# Patient Record
Sex: Female | Born: 1940
Health system: Southern US, Community
[De-identification: ages and names within clinical notes are randomized; demographics above are authoritative.]

## PROBLEM LIST (undated history)

## (undated) DIAGNOSIS — I6529 Occlusion and stenosis of unspecified carotid artery: Secondary | ICD-10-CM

## (undated) DIAGNOSIS — K649 Unspecified hemorrhoids: Secondary | ICD-10-CM

## (undated) DIAGNOSIS — E039 Hypothyroidism, unspecified: Secondary | ICD-10-CM

## (undated) DIAGNOSIS — I10 Essential (primary) hypertension: Secondary | ICD-10-CM

## (undated) DIAGNOSIS — K602 Anal fissure, unspecified: Secondary | ICD-10-CM

## (undated) DIAGNOSIS — K635 Polyp of colon: Secondary | ICD-10-CM

## (undated) DIAGNOSIS — E785 Hyperlipidemia, unspecified: Secondary | ICD-10-CM

## (undated) DIAGNOSIS — G709 Myoneural disorder, unspecified: Secondary | ICD-10-CM

## (undated) DIAGNOSIS — K5790 Diverticulosis of intestine, part unspecified, without perforation or abscess without bleeding: Secondary | ICD-10-CM

## (undated) DIAGNOSIS — E119 Type 2 diabetes mellitus without complications: Secondary | ICD-10-CM

## (undated) DIAGNOSIS — R011 Cardiac murmur, unspecified: Secondary | ICD-10-CM

## (undated) DIAGNOSIS — M1990 Unspecified osteoarthritis, unspecified site: Secondary | ICD-10-CM

## (undated) DIAGNOSIS — K219 Gastro-esophageal reflux disease without esophagitis: Secondary | ICD-10-CM

## (undated) DIAGNOSIS — R7303 Prediabetes: Secondary | ICD-10-CM

## (undated) HISTORY — DX: Unspecified hemorrhoids: K64.9

## (undated) HISTORY — PX: APPENDECTOMY: SHX54

## (undated) HISTORY — DX: Prediabetes: R73.03

## (undated) HISTORY — PX: COLONOSCOPY: SHX174

## (undated) HISTORY — DX: Anal fissure, unspecified: K60.2

## (undated) HISTORY — DX: Gastro-esophageal reflux disease without esophagitis: K21.9

## (undated) HISTORY — DX: Diverticulosis of intestine, part unspecified, without perforation or abscess without bleeding: K57.90

## (undated) HISTORY — PX: OTHER SURGICAL HISTORY: SHX169

## (undated) HISTORY — PX: ABDOMINAL HYSTERECTOMY: SHX81

## (undated) HISTORY — DX: Hyperlipidemia, unspecified: E78.5

## (undated) HISTORY — PX: BLADDER SUSPENSION: SHX72

## (undated) HISTORY — DX: Polyp of colon: K63.5

## (undated) SURGERY — Surgical Case
Anesthesia: *Unknown

---
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
## 1986-02-28 HISTORY — PX: ABDOMINAL HYSTERECTOMY: SHX81

## 1997-07-31 ENCOUNTER — Ambulatory Visit (HOSPITAL_COMMUNITY): Admission: RE | Admit: 1997-07-31 | Discharge: 1997-07-31 | Payer: Self-pay | Admitting: Internal Medicine

## 1997-11-17 ENCOUNTER — Encounter: Admission: RE | Admit: 1997-11-17 | Discharge: 1997-11-17 | Payer: Self-pay | Admitting: *Deleted

## 1999-02-15 ENCOUNTER — Ambulatory Visit (HOSPITAL_COMMUNITY): Admission: RE | Admit: 1999-02-15 | Discharge: 1999-02-15 | Payer: Self-pay | Admitting: Internal Medicine

## 1999-02-15 ENCOUNTER — Encounter: Payer: Self-pay | Admitting: Internal Medicine

## 2000-02-28 ENCOUNTER — Ambulatory Visit (HOSPITAL_COMMUNITY): Admission: RE | Admit: 2000-02-28 | Discharge: 2000-02-28 | Payer: Self-pay | Admitting: Internal Medicine

## 2000-02-28 ENCOUNTER — Encounter: Payer: Self-pay | Admitting: Internal Medicine

## 2001-03-06 ENCOUNTER — Encounter: Payer: Self-pay | Admitting: Internal Medicine

## 2001-03-06 ENCOUNTER — Ambulatory Visit (HOSPITAL_COMMUNITY): Admission: RE | Admit: 2001-03-06 | Discharge: 2001-03-06 | Payer: Self-pay | Admitting: Internal Medicine

## 2001-08-14 ENCOUNTER — Encounter: Payer: Self-pay | Admitting: Orthopedic Surgery

## 2001-08-14 ENCOUNTER — Ambulatory Visit (HOSPITAL_COMMUNITY): Admission: RE | Admit: 2001-08-14 | Discharge: 2001-08-14 | Payer: Self-pay | Admitting: Orthopedic Surgery

## 2002-03-18 ENCOUNTER — Encounter: Payer: Self-pay | Admitting: Internal Medicine

## 2002-03-18 ENCOUNTER — Ambulatory Visit (HOSPITAL_COMMUNITY): Admission: RE | Admit: 2002-03-18 | Discharge: 2002-03-18 | Payer: Self-pay | Admitting: Internal Medicine

## 2002-06-10 ENCOUNTER — Encounter: Admission: RE | Admit: 2002-06-10 | Discharge: 2002-06-10 | Payer: Self-pay | Admitting: Urology

## 2002-06-10 ENCOUNTER — Encounter: Payer: Self-pay | Admitting: Urology

## 2002-06-11 ENCOUNTER — Ambulatory Visit (HOSPITAL_BASED_OUTPATIENT_CLINIC_OR_DEPARTMENT_OTHER): Admission: RE | Admit: 2002-06-11 | Discharge: 2002-06-11 | Payer: Self-pay | Admitting: Urology

## 2003-03-20 ENCOUNTER — Ambulatory Visit (HOSPITAL_COMMUNITY): Admission: RE | Admit: 2003-03-20 | Discharge: 2003-03-20 | Payer: Self-pay | Admitting: Internal Medicine

## 2004-03-30 ENCOUNTER — Ambulatory Visit (HOSPITAL_COMMUNITY): Admission: RE | Admit: 2004-03-30 | Discharge: 2004-03-30 | Payer: Self-pay | Admitting: Internal Medicine

## 2004-08-26 ENCOUNTER — Encounter: Admission: RE | Admit: 2004-08-26 | Discharge: 2004-08-26 | Payer: Self-pay | Admitting: Occupational Medicine

## 2005-02-28 HISTORY — PX: JOINT REPLACEMENT: SHX530

## 2005-04-25 ENCOUNTER — Ambulatory Visit (HOSPITAL_COMMUNITY): Admission: RE | Admit: 2005-04-25 | Discharge: 2005-04-25 | Payer: Self-pay | Admitting: Internal Medicine

## 2006-04-24 ENCOUNTER — Inpatient Hospital Stay (HOSPITAL_COMMUNITY): Admission: RE | Admit: 2006-04-24 | Discharge: 2006-04-28 | Payer: Self-pay | Admitting: Orthopedic Surgery

## 2006-06-12 ENCOUNTER — Ambulatory Visit (HOSPITAL_COMMUNITY): Admission: RE | Admit: 2006-06-12 | Discharge: 2006-06-12 | Payer: Self-pay | Admitting: Internal Medicine

## 2007-05-24 ENCOUNTER — Ambulatory Visit: Payer: Self-pay | Admitting: Gastroenterology

## 2007-06-12 ENCOUNTER — Encounter: Payer: Self-pay | Admitting: Gastroenterology

## 2007-06-12 ENCOUNTER — Ambulatory Visit: Payer: Self-pay | Admitting: Gastroenterology

## 2007-06-14 ENCOUNTER — Ambulatory Visit (HOSPITAL_COMMUNITY): Admission: RE | Admit: 2007-06-14 | Discharge: 2007-06-14 | Payer: Self-pay | Admitting: Internal Medicine

## 2007-06-25 ENCOUNTER — Encounter: Payer: Self-pay | Admitting: Gastroenterology

## 2008-06-24 ENCOUNTER — Ambulatory Visit (HOSPITAL_COMMUNITY): Admission: RE | Admit: 2008-06-24 | Discharge: 2008-06-24 | Payer: Self-pay | Admitting: Internal Medicine

## 2008-12-01 ENCOUNTER — Encounter (HOSPITAL_COMMUNITY): Admission: RE | Admit: 2008-12-01 | Discharge: 2008-12-01 | Payer: Self-pay | Admitting: Orthopedic Surgery

## 2009-06-09 ENCOUNTER — Encounter (INDEPENDENT_AMBULATORY_CARE_PROVIDER_SITE_OTHER): Payer: Self-pay | Admitting: *Deleted

## 2009-06-15 ENCOUNTER — Encounter (INDEPENDENT_AMBULATORY_CARE_PROVIDER_SITE_OTHER): Payer: Self-pay | Admitting: *Deleted

## 2009-06-15 ENCOUNTER — Telehealth: Payer: Self-pay | Admitting: Gastroenterology

## 2009-06-25 ENCOUNTER — Ambulatory Visit (HOSPITAL_COMMUNITY): Admission: RE | Admit: 2009-06-25 | Discharge: 2009-06-25 | Payer: Self-pay | Admitting: Internal Medicine

## 2009-07-08 ENCOUNTER — Encounter (INDEPENDENT_AMBULATORY_CARE_PROVIDER_SITE_OTHER): Payer: Self-pay | Admitting: *Deleted

## 2009-07-13 ENCOUNTER — Ambulatory Visit: Payer: Self-pay | Admitting: Gastroenterology

## 2009-07-21 ENCOUNTER — Ambulatory Visit: Payer: Self-pay | Admitting: Gastroenterology

## 2009-07-27 ENCOUNTER — Encounter: Payer: Self-pay | Admitting: Gastroenterology

## 2009-09-07 ENCOUNTER — Ambulatory Visit (HOSPITAL_COMMUNITY): Admission: RE | Admit: 2009-09-07 | Discharge: 2009-09-07 | Payer: Self-pay | Admitting: Orthopedic Surgery

## 2010-03-21 ENCOUNTER — Encounter: Payer: Self-pay | Admitting: Internal Medicine

## 2010-04-01 NOTE — Progress Notes (Signed)
Summary: Triage   Phone Note Call from Patient Call back at Home Phone [phone]   Caller: Patient Call For: Dr. Russella Dar Reason for Call: Talk to Nurse Summary of Call: pt is confused b/c she was thinking her colonoscopy was due 05/2010. Our records show 05/2009. Would like to discuss Initial call taken by: Karna Christmas,  June 15, 2009 2:39 PM  Follow-up for Phone Call        Colon recall is 4/11 so pt. scheduled for procedure and pre-visit. Follow-up by: Teryl Lucy RN,  June 15, 2009 3:17 PM

## 2010-04-01 NOTE — Letter (Signed)
Summary: [facility] Instructions  [facility]  [address] [place], [place] [address]   Phone: [phone]  Fax: [phone]       ARELLA BLINDER    [birth year]    MRN: [id number]        Procedure Day Dorna Bloom:  Jake Shark  07/21/09     Arrival Time:  1:00PM     Procedure Time:  2:00PM     Location of Procedure:                    _X _  [facility] (4th Floor)                      PREPARATION FOR COLONOSCOPY WITH MOVIPREP   Starting 5 days prior to your procedure 07/16/09 do not eat nuts, seeds, popcorn, corn, beans, peas,  salads, or any raw vegetables.  Do not take any fiber supplements (e.g. Metamucil, Citrucel, and Benefiber).  THE DAY BEFORE YOUR PROCEDURE         DATE: 07/20/09  DAY: MONDAY  1.  Drink clear liquids the entire day-NO SOLID FOOD  2.  Do not drink anything colored red or purple.  Avoid juices with pulp.  No orange juice.  3.  Drink at least 64 oz. (8 glasses) of fluid/clear liquids during the day to prevent dehydration and help the prep work efficiently.  CLEAR LIQUIDS INCLUDE: Water Jello Ice Popsicles Tea (sugar ok, no milk/cream) Powdered fruit flavored drinks Coffee (sugar ok, no milk/cream) Gatorade Juice: apple, white grape, white cranberry  Lemonade Clear bullion, consomm, broth Carbonated beverages (any kind) Strained chicken noodle soup Hard Candy                             4.  In the morning, mix first dose of MoviPrep solution:    Empty 1 Pouch A and 1 Pouch B into the disposable container    Add lukewarm drinking water to the top line of the container. Mix to dissolve    Refrigerate (mixed solution should be used within 24 hrs)  5.  Begin drinking the prep at 5:00 p.m. The MoviPrep container is divided by 4 marks.   Every 15 minutes drink the solution down to the next mark (approximately 8 oz) until the full liter is complete.   6.  Follow completed prep with 16 oz of clear liquid of your choice (Nothing red  or purple).  Continue to drink clear liquids until bedtime.  7.  Before going to bed, mix second dose of MoviPrep solution:    Empty 1 Pouch A and 1 Pouch B into the disposable container    Add lukewarm drinking water to the top line of the container. Mix to dissolve    Refrigerate  THE DAY OF YOUR PROCEDURE      DATE: 07/21/09  DAY: TUESDAY  Beginning at 9:00AM (5 hours before procedure):         1. Every 15 minutes, drink the solution down to the next mark (approx 8 oz) until the full liter is complete.  2. Follow completed prep with 16 oz. of clear liquid of your choice.    3. You may drink clear liquids until 12:00PM (2 HOURS BEFORE PROCEDURE).   MEDICATION INSTRUCTIONS  Unless otherwise instructed, you should take regular prescription medications with a small sip of water   as early as possible the morning of your  procedure.   Additional medication instructions: Hold Lasix and Bisofum/Hct the morning of procedure.         OTHER INSTRUCTIONS  You will need a responsible adult at least 70 years of age to accompany you and drive you home.   This person must remain in the waiting room during your procedure.  Wear loose fitting clothing that is easily removed.  Leave jewelry and other valuables at home.  However, you may wish to bring a book to read or  an iPod/MP3 player to listen to music as you wait for your procedure to start.  Remove all body piercing jewelry and leave at home.  Total time from sign-in until discharge is approximately 2-3 hours.  You should go home directly after your procedure and rest.  You can resume normal activities the  day after your procedure.  The day of your procedure you should not:   Drive   Make legal decisions   Operate machinery   Drink alcohol   Return to work  You will receive specific instructions about eating, activities and medications before you leave.    The above instructions have been reviewed and explained  to me by   _______________________    I fully understand and can verbalize these instructions _____________________________ Date _________

## 2010-04-01 NOTE — Letter (Signed)
Summary: Patient Notice- Polyp Results  [facility]  [address] [place], [place] [address]   Phone: [phone]  Fax: [phone]        Jul 27, 2009 MRN: [id number]    [facility] [address] [place], [place]  [address]    Dear Ms. Lance,  I am pleased to inform you that the colon polyp(s) removed during your recent colonoscopy was (were) found to be benign (no cancer detected) upon pathologic examination.  I recommend you have a repeat colonoscopy examination in 3 years to look for recurrent polyps, as having colon polyps increases your risk for having recurrent polyps or even colon cancer in the future.  Should you develop new or worsening symptoms of abdominal pain, bowel habit changes or bleeding from the rectum or bowels, please schedule an evaluation with either your primary care physician or with me.  Continue treatment plan as outlined the day of your exam.  Please call us if you are having persistent problems or have questions about your condition that have not been fully answered at this time.  Sincerely,  Meryl Dare MD [facility]  This letter has been electronically signed by your physician.  Appended Document: Patient Notice- Polyp Results letter mailed.

## 2010-04-01 NOTE — Miscellaneous (Signed)
Summary: [facility] Previsit/prep  Clinical Lists Changes  Medications: Added new medication of MOVIPREP 100 GM  SOLR (PEG-KCL-NACL-NASULF-NA ASC-C) As per prep instructions. - Signed Rx of MOVIPREP 100 GM  SOLR (PEG-KCL-NACL-NASULF-NA ASC-C) As per prep instructions.;  #1 x 0;  Signed;  Entered by: Wyona Almas RN;  Authorized by: Meryl Dare MD [facility];  Method used: Electronically to [facility] [address], [place], [place], [place]  [address], Ph: [phone], Fax: [phone] Observations: Added new observation of NKA: T (07/13/2009 15:31)    Prescriptions: MOVIPREP 100 GM  SOLR (PEG-KCL-NACL-NASULF-NA ASC-C) As per prep instructions.  #1 x 0   Entered by:   Wyona Almas RN   Authorized by:   Meryl Dare MD [facility]   Signed by:   Wyona Almas RN on 07/13/2009   Method used:   Electronically to        [facility] (retail)       [address]       [place], [place]  [address]       Ph: [phone]       Fax: [phone]   RxID:   [id number]

## 2010-04-01 NOTE — Letter (Signed)
Summary: Colonoscopy Letter  [facility]  [address] [place], [place] [address]   Phone: [phone]  Fax: [phone]      June 09, 2009 MRN: [id number]   [facility] [address] [place], [place]  [address]   Dear Ms. Deller,   According to your medical record, it is time for you to schedule a Colonoscopy. The American Cancer Society recommends this procedure as a method to detect early colon cancer. Patients with a family history of colon cancer, or a personal history of colon polyps or inflammatory bowel disease are at increased risk.  This letter has beeen generated based on the recommendations made at the time of your procedure. If you feel that in your particular situation this may no longer apply, please contact our office.  Please call our office at [phone] to schedule this appointment or to update your records at your earliest convenience.  Thank you for cooperating with [place] to provide you with the very best care possible.   Sincerely,  Judie Petit T. Russella Dar, M.D.  [facility] Gastroenterology Division [phone]

## 2010-04-01 NOTE — Procedures (Signed)
Summary: Colonoscopy  Patient: Rebecca Graves Note: All result statuses are Final unless otherwise noted.  Tests: (1) Colonoscopy (COL)   COL Colonoscopy           DONE     [facility]     [address]     [place], [place]  [address]           COLONOSCOPY PROCEDURE REPORT           PATIENT:  Rebecca Graves, Rebecca Graves  MR#:  [id number]     BIRTHDATE:  [birth year], 68 yrs. old  GENDER:  female     ENDOSCOPIST:  Judie Petit T. Russella Dar, MD, [facility]           PROCEDURE DATE:  07/21/2009     PROCEDURE:  Colonoscopy with snare polypectomy     ASA CLASS:  Class II     INDICATIONS:  1) follow-up of polyp  2) surveillance and high-risk     screening, adenomatous polyp with HGD, 07/2007     MEDICATIONS:   Fentanyl 100 mcg IV, Versed 10 mg IV     DESCRIPTION OF PROCEDURE:   After the risks benefits and     alternatives of the procedure were thoroughly explained, informed     consent was obtained.  Digital rectal exam was performed and     revealed no abnormalities.  The LB PCF-H180AL [id number] endoscope     was introduced through the anus and advanced to the cecum, which     was identified by both the appendix and ileocecal valve, without     limitations. The quality of the prep was excellent, using     MoviPrep.  The instrument was then slowly withdrawn as the colon     was fully examined.     <<PROCEDUREIMAGES>>     FINDINGS:  A sessile polyp was found in the cecum. It was 7 mm in     size. Polyp was snared without cautery. Retrieval was successful.     Two polyps were found in the mid transverse colon. They were 4 - 6     mm in size. Polyps were snared without cautery. Retrieval was     successful. A sessile polyp was found in the sigmoid colon. It was     5 mm in size. Polyp was snared without cautery. Retrieval was     successful. Mild diverticulosis was found in the sigmoid colon.     This was otherwise a normal examination of the colon. Retroflexed     views in the rectum revealed no  abnormalities. The time to cecum =     3.75  minutes. The scope was then withdrawn (time =  11  min) from     the patient and the procedure completed.     COMPLICATIONS:  None           ENDOSCOPIC IMPRESSION:     1) 7 mm sessile polyp in the cecum     2) 4 - 6 mm Two polyps in the mid transverse colon     3) 5 mm sessile polyp in the sigmoid colon     4) Mild diverticulosis in the sigmoid colon     RECOMMENDATIONS:     1) Await pathology results     2) High fiber diet with liberal fluid intake.     3) Repeat Colonoscopy in 2-3 years pending pathology review.           Venita Lick. Russella Dar, MD,  FACG           CC: [name], MD           n.     Rosalie DoctorVenita Lick. Keyara Ent at 07/21/2009 02:41 PM           Georgeanna Harrison, [id number]  Note: An exclamation mark (!) indicates a result that was not dispersed into the flowsheet. Document Creation Date: 07/21/2009 2:42 PM _______________________________________________________________________  (1) Order result status: Final Collection or observation date-time: 07/21/2009 14:34 Requested date-time:  Receipt date-time:  Reported date-time:  Referring Physician:   Ordering Physician: Claudette Head [phone]) Specimen Source:  Source: Launa Grill Order Number: [id number] Lab site:   Appended Document: Colonoscopy     Procedures Next Due Date:    Colonoscopy: 06/2012

## 2010-04-01 NOTE — Letter (Signed)
Summary: Previsit letter  [facility]  [address] [place], [place] [address]   Phone: [phone]  Fax: [phone]       06/15/2009 MRN: [id number]  [facility] [address] [place], [place]  [address]  Dear Ms. Knaak,  Welcome to the Gastroenterology Division at [facility].    You are scheduled to see a nurse for your pre-procedure visit on Monday-May 16,2011 at 3:30 p.m. on the 3rd floor at [facility], [address]  We ask that you try to arrive at our office 15 minutes prior to your appointment time to allow for check-in.  Your nurse visit will consist of discussing your medical and surgical history, your immediate family medical history, and your medications.    Please bring a complete list of all your medications or, if you prefer, bring the medication bottles and we will list them.  We will need to be aware of both prescribed and over the counter drugs.  We will need to know exact dosage information as well.  If you are on blood thinners (Coumadin, Plavix, Aggrenox, Ticlid, etc.) please call our office today/prior to your appointment, as we need to consult with your physician about holding your medication.   Please be prepared to read and sign documents such as consent forms, a financial agreement, and acknowledgement forms.  If necessary, and with your consent, a friend or relative is welcome to sit-in on the nurse visit with you.  Please bring your insurance card so that we may make a copy of it.  If your insurance requires a referral to see a specialist, please bring your referral form from your primary care physician.  No co-pay is required for this nurse visit.     If you cannot keep your appointment, please call [phone] to cancel or reschedule prior to your appointment date.  This allows [place] the opportunity to schedule an appointment for another patient in need of care.    Thank you for choosing [facility] for your  medical needs.  We appreciate the opportunity to care for you.  Please visit [place] at our website  to learn more about our practice. Your colonoscopy is scheduled for 07/21/09 at 2p.m. at [facility]. floor.                     Sincerely.                                                                                                                   The Gastroenterology Division

## 2010-05-18 ENCOUNTER — Other Ambulatory Visit (HOSPITAL_COMMUNITY): Payer: Self-pay | Admitting: Internal Medicine

## 2010-05-18 DIAGNOSIS — Z1231 Encounter for screening mammogram for malignant neoplasm of breast: Secondary | ICD-10-CM

## 2010-06-28 ENCOUNTER — Ambulatory Visit (HOSPITAL_COMMUNITY)
Admission: RE | Admit: 2010-06-28 | Discharge: 2010-06-28 | Disposition: A | Discharge status: Home / Self Care | Payer: MEDICARE | Source: Ambulatory Visit | Attending: Internal Medicine | Admitting: Internal Medicine

## 2010-06-28 DIAGNOSIS — Z1231 Encounter for screening mammogram for malignant neoplasm of breast: Secondary | ICD-10-CM | POA: Insufficient documentation

## 2010-07-13 NOTE — Assessment & Plan Note (Signed)
[facility]                         GASTROENTEROLOGY OFFICE NOTE   TANGY, DROZDOWSKI                       MRN:          [id number]  DATE:05/24/2007                            DOB:          07-31-1940    REFERRING PHYSICIAN:  [name], M.D.   REASON FOR CONSULTATION:  Family history of colon polyps and GERD.   HISTORY OF PRESENT ILLNESS:  Mrs. Holtmeyer is a 70 year old African-  American female with a sister who developed colon polyps at age 25.  No  other family members with colon cancer, colon polyps or inflammatory  bowel disease.  She relates a history of internal and external  hemorrhoids with occasional swelling, but no bleeding, pain or itching.  She has occasional gas and bloating that she relates to B complex  vitamins.  She has no abdominal pain, rectal pain, change in bowel  habits, change in stool caliber, melena, hematochezia or weight-loss.  She has had problems with reflux for the past three to five years and  her symptoms are under excellent control on omeprazole and dietary  measures.  She states she recently had stool Hemoccults that were  negative.   PAST MEDICAL HISTORY:  hypertension  hypothyroidism  osteoarthritis  glaucoma  allergic rhinitis  GERD   PAST SURGICAL HISTORY:  Status post hysterectomy, 1988.  Status post total left hip replacement, 2007.   MEDICATION ALLERGIES:  None known.   CURRENT MEDICATIONS:  Listed on the chart, updated and reviewed.   SOCIAL HISTORY:  Per the handwritten form.   REVIEW OF SYSTEMS:  Per the handwritten form.   PHYSICAL EXAM:  Well-developed, overweight, African-American female, in  no acute distress.  Height 5 feet 8 inches, weight 212.4 pounds, blood pressure is 140/70,  pulse 70 and regular.  HEENT EXAM:  Anicteric sclerae.  Oropharynx clear.  CHEST:  Clear to auscultation bilaterally.  CARDIAC:  Regular rate and rhythm without murmurs appreciated.  ABDOMEN:  Soft,  nontender, nondistended.  Normoactive bowel sounds.  No  palpable organomegaly, masses or hernias.  RECTAL EXAMINATION:  Deferred to time of colonoscopy.  EXTREMITIES:  Without clubbing, cyanosis or edema.  NEUROLOGIC:  Alert and oriented times three.  Grossly nonfocal.   ASSESSMENT AND PLAN:  1. Sister with colon polyps at age 7.  Rule out colorectal neoplasms.      Risks, benefits, and alternatives to colonoscopy with possible      biopsy and possible polypectomy discussed with the patient and she      consents to proceed.  This will be scheduled electively.  2. GERD.  Symptoms under very good control on omeprazole 40 mg q.a.m.      She is advised to begin standard antireflux measures and written      literature was supplied to the patient.  If she has persistent      reflux symptoms that require medication for another three to five      years, would consider upper endoscopy for Barrett's screening.     Venita Lick. Russella Dar, MD, [facility]  Electronically Signed    MTS/MedQ  DD: 05/25/2007  DT: 05/25/2007  Job #: [id number]

## 2010-07-16 NOTE — Op Note (Signed)
NAME:  Rebecca Graves, Rebecca Graves                          ACCOUNT NO.:  [id number]   MEDICAL RECORD NO.:  [id number]                   PATIENT TYPE:  AMB   LOCATION:  [facility]                                 FACILITY:  [facility]   PHYSICIAN:  Ronald L. Ovidio Hanger, M.D.           DATE OF BIRTH:  1940/11/10   DATE OF PROCEDURE:  06/11/2002  DATE OF DISCHARGE:                                 OPERATIVE REPORT   DIAGNOSIS:  Type 3 stress urinary incontinence.   PROCEDURES:  1. Tension-free vaginal tape.  2. Cystourethroscopy.   SURGEON:  Lucrezia Starch. Earlene Plater, M.D.   ANESTHESIA:  General endotracheal.   ESTIMATED BLOOD LOSS:  150 mL.   TUBES:  None.   COMPLICATIONS:  None.   INDICATION FOR PROCEDURE:  The patient is a lovely 70 year old black female  who basically presented with significant stress urinary incontinence, which  has been problematic for a number of years.  She underwent a straining  cystogram, which revealed very little descensus, but some beaking was noted  of the bladder neck area.  On urodynamics she was found to have a leak point  pressure of 70 cmH2O, but there was gushing leakage and we felt she did have  type 3 urinary incontinence and that a pubovaginal sling would be  appropriate.  We discussed Contigen injections and other treatments and  different types of pubovaginal slings in detail as noted in the office  record.  After understanding the risks, benefits, and alternatives, she  elected to proceed with a TVT.  She understands the risks of urinary  retention, bleeding, infection, and continued incontinence, and she knows  that within five to six days she may need to have it adjusted.   DESCRIPTION OF PROCEDURE:  The patient was placed in the supine position,  after proper general endotracheal anesthesia was placed in the dorsal  lithotomy position and prepped with Betadine in a sterile fashion.  Marks  were made at the appropriate punch sites on the abdomen, and a weighted  speculum was placed.  The submucosal area over the urethrovesical junction  was injected with approximately 10 mL of 1% Xylocaine with epinephrine.  A  16 French Foley was placed into the bladder and the bladder was drained, and  a vertical incision was made approximately a centimeter in length at the  urethrovesical junction.  Flaps were created with Strulle scissors, quite  thick, and the endopelvic fascia could be palpated bilaterally.  Utilizing  the needle insertion devices from above, punch holes were made in the skin  with a knife and each was placed and exited through the endopelvic fascia  laterally to the urethra to reach the urethrovesical junction.  Cystourethroscopy with a 22.5 French Olympus panendoscope was utilized, and  the bladder was distended.  Both 12 and 0 degree lenses were utilized.  An  efflux of clear urine was noted from the normally-placed ureteral orifices  bilaterally.  There were no perforations noted.  The TVT tapes were then  attached with the attachment device and delivered vaginally from above, and  the TVT tape was deployed after repeat cystourethroscopy had been performed,  and again no perforations were noted.  The sling was noted to be in good  position.  There was space between it and the urethra.  It appeared to lay  open, and we were comfortable with the tension.  The mucosa was then closed  with a running 2-0 Vicryl suture.  Thorough irrigation was performed.  Good  hemostasis was noted to be present.  Cystourethroscopy was again performed,  and the bladder was filled.  There were no perforations noted and when the  scope was taken out with approximately 150 mL on the Crede maneuver, slight  urine could be expressed.  Again we felt comfortable with the tension.  The  cystoscope was removed.  The tapes were cut subcutaneously from above, and  the punch holes were sealed with Dermabond.  The vaginal pack was placed  with one-inch vaginal pack with  bacitracin ointment, and the patient was  taken to the recovery room stable.                                               Ronald L. Ovidio Hanger, M.D.    RLD/MEDQ  D:  06/11/2002  T:  06/11/2002  Job:  [id number]

## 2010-07-16 NOTE — Discharge Summary (Signed)
Rebecca Graves, Rebecca Graves                ACCOUNT NO.:  [id number]   MEDICAL RECORD NO.:  [id number]          PATIENT TYPE:  INP   LOCATION:  5016                         FACILITY:  [facility]   PHYSICIAN:  Feliberto Gottron. Turner Daniels, M.D.   DATE OF BIRTH:  Jan 05, 1941   DATE OF ADMISSION:  04/24/2006  DATE OF DISCHARGE:  04/28/2006                               DISCHARGE SUMMARY   PRIMARY DIAGNOSIS:  End-stage degenerative joint disease of the right  hip.   PROCEDURE WHILE IN HOSPITAL:  Right total knee arthroplasty.   DISCHARGE SUMMARY:  The patient is a 70 year old women with a global AVN  of both hips proven by MRI scan.  She has had severe pain on the right  and little if any pain on the left side.  Has failed conservative  treatment including antiinflammatory medications and use of cane.  Got  good temporary relief from cortisone injection done under fluoroscopy by  Dr. Modesto Charon but once again has severe disabling right hip pain.  She wishes  to proceed with right total knee arthroplasty after discussing risk and  benefits of the surgery.   ALLERGIES:  No known drug allergies.   MEDICATIONS AT TIME OF ADMISSION:  1. Include Restasis.  2. Lumigan.  3. Timolol.  4. Metamucil.  5. Prevacid.  6. Synthroid.  7. Verapamil.  8. Premarin.  9. Alprazolam.  10.Tyrosamine.  11.Percocet.  12.Magnesium.  13.Red yeast rice.  14.Centrum Silver.  15.Ranitidine.   PAST MEDICAL HISTORY:  Usual childhood disease.   ADULT HISTORY:  1. Hypertension.  2. DJD.  3. Hyperthyroidism.   SURGICAL HISTORY:  1. Hysterectomy in 1986.  2. No difficulties with GET.   SOCIAL HISTORY:  No tobacco.  Occasional ethanol.  No IV drug abuse.  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   FAMILY HISTORY:  Father died at 36 due to MI.  Mother died at 18 due to  cancer.   REVIEW OF SYSTEMS:  Positive for glasses.  She denies any shortness of  breath, chest pain or recent illness.   PHYSICAL EXAMINATION:  VITAL SIGNS:   Temperature 97.6.  Pulse 61.  Respirations 16.  Blood pressure 131/77.  Patient is a 5 foot 7 inch,  94.3 kg female.  HEAD:  Normocephalic, atraumatic.  EAR:  TMs are clear.  EYES:  Pupils are equal round and reactive to light accommodation.  NOSE AND THROAT:  Benign.  She does note to have upper and lower  dentures.  NECK:  Supple.  Full range of motion.  CHEST:  Clear to auscultation.  HEART:  Regular rate and rhythm.  ABDOMEN:  Soft, nontender.  EXTREMITIES:  Shows pain with range of motion of right hip.  Internal  and external rotation 10 degrees each.  Positive foot tap.  Positive  limp.  Decreased light touch sensation of the right foot.  SKIN:  Within normal limits.   PREOPERATIVE LABS:  Including CBC, CMET, chest x-ray, EKG, PT and PTT  are within normal limits.   HOSPITAL COURSE:  On day of admission the patient was taken to  the  operating room at [facility] where she underwent right total  knee arthroplasty using DePuy components, 54-mm ASR cup, NK +0 47-mm  Ultima ball with an 18 x 13 x 150 x 42 SROM stem and an 49F small cone.  Foley catheter was placed periopeatively.  The patient was placed on a  perioperative antibiotic.  She was placed on postoperative Coumadin and  prophylaxis with bridging Lovenox performed as protocol.  She was placed  on PCA Dilaudid for pain control.  Physical therapy was begun on the  first postoperative day.  First postoperative day, the patient was  awake, alert.  No nausea or vomiting.  Taking p.o. well.  Vital signs  were stable.  She was afebrile.  Wound had scant drainage.  CBC was  within normal limits.  X-rays showed well placed, well fixed components.  She continued with weight bearing as tolerated physical therapy.  Postoperative day 2 patient was complaining of moderate pain.  She was  afebrile.  INR 1.3.  Hemoglobin 10.3.  Dressing still had scant dried  blood.  Wound edges were benign.  She continued physical therapy.   PCA  was discontinued.  Patient was being followed for discharge assistance  through discharge planning progression nurse.  Postoperative day 3 the  patient was without complaint.  She was afebrile.  Hemoglobin 8.7.  INR  1.4.  WBC 6.3.  Dressing changes showed wound to be benign.  She is  otherwise neurovascularly intact.  Had positive bowel sounds but had not  yet had a bowel movement.  Fluids were encouraged.  __________ was  signed, as it was felt that she would benefit from a short stay in a  skilled nursing facility.  Postoperative day 4, the patient was  complaining of symptoms of yeast infection.  No complaints regarding her  right hip.  She is afebrile.  Hemoglobin 8.7.  WBC 6.3.  INR 1.5.  Dressing was dry.  Wound was soft.  She was given Diflucan orally for  yeast as she has responded well to this in the past.  After long  discussion with the patient and her family despite the fact that  physical therapy felt that she might benefit from short term nursing  facility unfortunately this was not available through her insurance and  she did feel that she could safely return home enlisting the aide of  some family and friends.   MEDICATIONS AT TIME OF DISCHARGE:  Prescriptions given for:  1. Tylox.  2. Coumadin.  3. Robaxin.  4. Resume home meds as per home med rec sheet.   DIET:  Regular.   ACTIVITY:  Weight bearing as tolerated with total hip precautions of the  right lower extremity.  Dressing changes daily with paper tape and  gauze.  Return to clinic __________ one week's time.  Sooner should she  have any difficulties with drainage, increased pain not well controlled  by pain medication or fevers over 101.  She will need home health RN,  home health PT, durable medical equipment as per [place].  At the time  of discharge, the patient was medically stable and orthopedically improved after having under gone right total hip.      Laural Benes. Jannet Mantis.      Feliberto Gottron.  Turner Daniels, M.D.  Electronically Signed    JBR/MEDQ  D:  06/12/2006  T:  06/12/2006  Job:  [id number]

## 2010-07-16 NOTE — Op Note (Signed)
NAMEMARKA, TRELOAR                ACCOUNT NO.:  [id number]   MEDICAL RECORD NO.:  [id number]          PATIENT TYPE:  INP   LOCATION:  2550                         FACILITY:  [facility]   PHYSICIAN:  Feliberto Gottron. Turner Daniels, M.D.   DATE OF BIRTH:  08-Feb-1941   DATE OF PROCEDURE:  04/24/2006  DATE OF DISCHARGE:                               OPERATIVE REPORT   PREOPERATIVE DIAGNOSIS:  Avascular necrosis of bilateral hips.   POSTOPERATIVE DIAGNOSIS:  Avascular necrosis of bilateral hips.   PROCEDURE:  Right total hip arthroplasty using DePuy 54 mm ASR cup and  NK +0 47 mm Ultamet ball and 18 x 13 x 150 x 42 SROM stem and 18 F small  cone.   SURGEON:  Feliberto Gottron. Turner Daniels, M.D.   ASSISTANT:  Skip Mayer PA-C.   ANESTHETIC:  Endotracheal.   ESTIMATED BLOOD LOSS:  300 mL.   URINE OUTPUT:  300 mL.   DRAINS PLACED:  Foley catheter.   TOURNIQUET TIME:  None.   INDICATIONS FOR PROCEDURE:  70 year old woman with a global AVN of both  hips proven by MRI scan.  Interestingly, she has severe pain on the  right and little if any pain on the left side and has failed  conservative treatment, with anti-inflammatory medicine, use of a cane,  got good temporary relief from cortisone injection done under  fluoroscopy by Dr. Modesto Charon, but once again has severe disabling right hip  pain.  She is taken for right total hip arthroplasty.  Risks and  benefits of surgery discussed and she is prepared for surgical  intervention.   DESCRIPTION OF PROCEDURE:  The patient was identified by armband, taken  to the operating room at [facility].  Appropriate anesthetic  monitors were attached and general endotracheal anesthesia induced with  the patient in supine position.  She was then rolled into the left  lateral decubitus position and fixed there with a Stulberg Mark II  pelvic clamp keeping the pelvis vertical.  The right lower extremity was  then prepped and draped in usual sterile fashion from the ankle to  the  hemi pelvis and the skin along the lateral hip and thigh infiltrated  with 20 mL of half percent Marcaine and epinephrine solution.  We then  made 20 cm incision allowing posterolateral approach to the hip joint  centered over the greater trochanter.  We cut through the skin and  subcutaneous tissue and small bleeders were identified and cauterized  with Bovie the IT band was cut in line with the skin incision, exposing  the greater trochanter, short external rotators and the gluteus muscles.  A Cobra retractor was placed between the gluteus minimus and the  superior hip joint capsule and second Cobra between the quadratus  femoris and the inferior joint capsule.  This isolated the piriformis  and short external rotators which were then cut off their insertion on  the intertrochanteric crest and tagged with a #2 Ethilon suture.  This  exposed the posterior aspect of the hip joint capsule which was then  developed into an acetabular based flap and  likewise tagged with two #2  Ethilon sutures.  The hip was then flexed, internally rotated  dislocating the arthritic femoral head which was bare bone superiorly  and evidence of delamination of the cartilage in the periphery.  A  standard neck cut was then performed one fingerbreadth above the lesser  trochanter and the proximal femur was translated anteriorly, levering  off the anterior column with a Homan retractor.  Posterior-superior and  posterior-inferior wing retractors were placed around the acetabulum  allowing resection of the labrum and finally a Cobra retractor was  placed in the cotyloid notch, enhancing our acetabular exposure.  We  then sequentially reamed up to a 53 mm basket reamer obtaining good  coverage in all quadrants.  Satisfied with the reaming, we then hammered  into place a 54-mm DePuy ASR shell obtaining good firm fit.  The hip was  then flexed and internally rotated exposing the proximal femur which was  entered  with the initiating reamer and then sequentially reamed up to a  13.5-mm axial reamer followed by conical milling up to an 18 F cone and  calcar milling up to a small calcar and 18 F small trial cone was then  placed in the proximal femur with a trial stem with a 42 neck at the  same version as the neck of the femur and an NK +0 trial 47 mm ball.  The hip was the reduced.  She did come to full extension.  She could  flex her knee to 130 degrees and could not be dislocated anteriorly.  In  flexion, you could flex to the 90, internally rotate to almost 80  degrees and once again, she was not stable.  At this point the trial  components were then removed.  The wound was irrigated out with normal  saline solution.  An 35 F small ZTT1 cone was then hammered into the  proximal femur followed by an 18 x 13 x 150 x 42 stem in the same  version as the cone followed by an NK +0 47 mm Ultamet ball which was  hammered on the stem with the module.  The hip was again reduced.  Stability was noted to be excellent. Range of motion was excellent.  The  wound once again irrigated out with normal saline solution and then the  capsular flap and short external rotators were repaired back to the  intertrochanteric crest through drill holes.  The IT band was closed  with running #1 Vicryl suture.  The subcutaneous tissue 0-0 and 2-0  undyed Vicryl suture and the skin with running interlocking 3-0 nylon  suture.  A dressing of Xeroform, and Metafil bandage was then applied.  The patient was undraped, unclamped, rolled supine, awakened and taken  to the recovery room without difficulty.      Feliberto Gottron. Turner Daniels, M.D.  Electronically Signed     FJR/MEDQ  D:  04/24/2006  T:  04/24/2006  Job:  [id number]

## 2011-03-10 ENCOUNTER — Other Ambulatory Visit: Payer: Self-pay | Admitting: Orthopedic Surgery

## 2011-03-22 NOTE — H&P (Signed)
  HISTORY OF PRESENT ILLNESS:  Ms. Rebecca Graves returns in followup, complaining of continued pain in her right knee.  She localizes her discomfort to the lateral side.  She had a cortisone injection in July 2012 that provided her with approximately two weeks of complete pain relief.  She reports that her knee and calf pain both resolved, and the cortisone shot was working well.  At this point she has minimal pain at rest but significant pain any time she is weightbearing.  Pivoting and changing directions are particularly painful.  She also complains of clicking and catching in the knee.  She is using 800 mg of ibuprofen three times a day on particularly bad days.  PAST MEDICAL HX: Hypertension, sleep apnea.  No known drug allergies  FAMILY HX: Hypertension  SOCIAL HX: Denies tobacco, Occasional alcohol   Review of systems: Patient denies dizziness, nausea, fever, chills, vomiting, shortness of breath, chest pain, loss of appetite, or rash.  All other systems are negative as related to the chief Complaint.  PHYSICAL EXAM: Well-developed, well-nourished.  Awake, alert, and oriented x3.  Extraocular motion is intact.  No use of accessory respiratory muscles for breathing.   Cardiovascular exam reveals a regular rhythm.  Skin is intact without cuts, scrapes, or abrasions  Right Knee:  Demonstrates exquisite tenderness to palpation along the lateral joint line. McMurray test is positive.  The ligaments are globally stable.  She has a trace effusion.  Range of motion is full.    IMPRESSION:  Right knee lateral meniscal tear with positive response to cortisone.  PLAN:   We will also go ahead and get her set up for a right knee arthroscopy.  We look forward to seeing her at the time of surgical intervention.

## 2011-03-23 ENCOUNTER — Encounter (HOSPITAL_BASED_OUTPATIENT_CLINIC_OR_DEPARTMENT_OTHER)
Admission: RE | Admit: 2011-03-23 | Discharge: 2011-03-23 | Disposition: A | Discharge status: Home / Self Care | Payer: Medicare Other | Source: Ambulatory Visit | Attending: Orthopedic Surgery | Admitting: Orthopedic Surgery

## 2011-03-23 ENCOUNTER — Encounter (HOSPITAL_BASED_OUTPATIENT_CLINIC_OR_DEPARTMENT_OTHER): Payer: Self-pay | Admitting: *Deleted

## 2011-03-23 NOTE — Progress Notes (Signed)
To come in for [id number]  

## 2011-03-24 ENCOUNTER — Encounter (HOSPITAL_BASED_OUTPATIENT_CLINIC_OR_DEPARTMENT_OTHER)
Admission: RE | Admit: 2011-03-24 | Discharge: 2011-03-24 | Disposition: A | Discharge status: Home / Self Care | Payer: Medicare Other | Source: Ambulatory Visit | Attending: Orthopedic Surgery | Admitting: Orthopedic Surgery

## 2011-03-24 LAB — BASIC METABOLIC PANEL
Calcium: 10.2 mg/dL (ref 8.4–10.5)
Creatinine, Ser: 0.94 mg/dL (ref 0.50–1.10)
GFR calc Af Amer: 70 mL/min — ABNORMAL LOW (ref >90)

## 2011-03-27 DIAGNOSIS — S83289A Other tear of lateral meniscus, current injury, unspecified knee, initial encounter: Secondary | ICD-10-CM | POA: Diagnosis present

## 2011-03-27 HISTORY — DX: Other tear of lateral meniscus, current injury, unspecified knee, initial encounter: S83.289A

## 2011-03-28 ENCOUNTER — Encounter (HOSPITAL_BASED_OUTPATIENT_CLINIC_OR_DEPARTMENT_OTHER): Payer: Self-pay | Admitting: Certified Registered Nurse Anesthetist

## 2011-03-28 ENCOUNTER — Ambulatory Visit (HOSPITAL_BASED_OUTPATIENT_CLINIC_OR_DEPARTMENT_OTHER)
Admission: RE | Admit: 2011-03-28 | Discharge: 2011-03-28 | Disposition: A | Discharge status: Home / Self Care | Payer: Medicare Other | Source: Ambulatory Visit | Attending: Orthopedic Surgery | Admitting: Orthopedic Surgery

## 2011-03-28 ENCOUNTER — Encounter (HOSPITAL_BASED_OUTPATIENT_CLINIC_OR_DEPARTMENT_OTHER)
Admission: RE | Disposition: A | Discharge status: Home / Self Care | Payer: Self-pay | Source: Ambulatory Visit | Attending: Orthopedic Surgery

## 2011-03-28 ENCOUNTER — Ambulatory Visit (HOSPITAL_BASED_OUTPATIENT_CLINIC_OR_DEPARTMENT_OTHER): Payer: Medicare Other | Admitting: Certified Registered Nurse Anesthetist

## 2011-03-28 DIAGNOSIS — Z87891 Personal history of nicotine dependence: Secondary | ICD-10-CM | POA: Insufficient documentation

## 2011-03-28 DIAGNOSIS — M2240 Chondromalacia patellae, unspecified knee: Secondary | ICD-10-CM | POA: Insufficient documentation

## 2011-03-28 DIAGNOSIS — S83289A Other tear of lateral meniscus, current injury, unspecified knee, initial encounter: Secondary | ICD-10-CM

## 2011-03-28 DIAGNOSIS — K219 Gastro-esophageal reflux disease without esophagitis: Secondary | ICD-10-CM | POA: Insufficient documentation

## 2011-03-28 DIAGNOSIS — I10 Essential (primary) hypertension: Secondary | ICD-10-CM | POA: Insufficient documentation

## 2011-03-28 DIAGNOSIS — M23359 Other meniscus derangements, posterior horn of lateral meniscus, unspecified knee: Secondary | ICD-10-CM | POA: Insufficient documentation

## 2011-03-28 DIAGNOSIS — G4730 Sleep apnea, unspecified: Secondary | ICD-10-CM | POA: Insufficient documentation

## 2011-03-28 HISTORY — DX: Essential (primary) hypertension: I10

## 2011-03-28 HISTORY — DX: Hypothyroidism, unspecified: E03.9

## 2011-03-28 HISTORY — DX: Unspecified osteoarthritis, unspecified site: M19.90

## 2011-03-28 HISTORY — PX: KNEE ARTHROSCOPY: SHX127

## 2011-03-28 SURGERY — ARTHROSCOPY, KNEE
Anesthesia: General | Site: Knee | Laterality: Right | Wound class: Clean

## 2011-03-28 MED ORDER — EPINEPHRINE HCL 1 MG/ML IJ SOLN
INTRAMUSCULAR | Status: DC | PRN
  Administered 2011-03-28: 1 mg

## 2011-03-28 MED ORDER — HYDROMORPHONE HCL PF 1 MG/ML IJ SOLN
0.2500 mg | INTRAMUSCULAR | Status: DC | PRN
  Administered 2011-03-28 (×2): 0.5 mg via INTRAVENOUS

## 2011-03-28 MED ORDER — LIDOCAINE HCL (CARDIAC) 20 MG/ML IV SOLN
INTRAVENOUS | Status: DC | PRN
  Administered 2011-03-28: 60 mg via INTRAVENOUS

## 2011-03-28 MED ORDER — PROPOFOL 10 MG/ML IV EMUL
INTRAVENOUS | Status: DC | PRN
  Administered 2011-03-28: 180 mg via INTRAVENOUS

## 2011-03-28 MED ORDER — ONDANSETRON HCL 4 MG/2ML IJ SOLN
INTRAMUSCULAR | Status: DC | PRN
  Administered 2011-03-28: 4 mg via INTRAVENOUS

## 2011-03-28 MED ORDER — FENTANYL CITRATE 0.05 MG/ML IJ SOLN
INTRAMUSCULAR | Status: DC | PRN
  Administered 2011-03-28 (×2): 50 ug via INTRAVENOUS
  Administered 2011-03-28: 25 ug via INTRAVENOUS

## 2011-03-28 MED ORDER — LACTATED RINGERS IV SOLN
INTRAVENOUS | Status: DC
  Administered 2011-03-28: 12:00:00 via INTRAVENOUS
  Administered 2011-03-28: 10 mL/h via INTRAVENOUS

## 2011-03-28 MED ORDER — HYDROCODONE-ACETAMINOPHEN 5-325 MG PO TABS: 1.0000 | ORAL_TABLET | ORAL | Status: AC | PRN

## 2011-03-28 MED ORDER — DROPERIDOL 2.5 MG/ML IJ SOLN: 0.6250 mg | INTRAMUSCULAR | Status: DC | PRN

## 2011-03-28 MED ORDER — DEXAMETHASONE SODIUM PHOSPHATE 4 MG/ML IJ SOLN
INTRAMUSCULAR | Status: DC | PRN
  Administered 2011-03-28: 5 mg via INTRAVENOUS

## 2011-03-28 MED ORDER — CEFAZOLIN SODIUM 1-5 GM-% IV SOLN: 1.0000 g | INTRAVENOUS | Status: DC

## 2011-03-28 MED ORDER — BUPIVACAINE-EPINEPHRINE 0.5% -1:200000 IJ SOLN
INTRAMUSCULAR | Status: DC | PRN
  Administered 2011-03-28: 20 mL

## 2011-03-28 MED ORDER — CHLORHEXIDINE GLUCONATE 4 % EX LIQD: 60.0000 mL | Freq: Once | CUTANEOUS | Status: DC

## 2011-03-28 MED ORDER — SODIUM CHLORIDE 0.9 % IR SOLN
Status: DC | PRN
  Administered 2011-03-28: 3000 mL

## 2011-03-28 SURGICAL SUPPLY — 39 items
BANDAGE ELASTIC 6 VELCRO ST LF (GAUZE/BANDAGES/DRESSINGS) ×2 IMPLANT
BLADE 4.2CUDA (BLADE) IMPLANT
BLADE CUTTER GATOR 3.5 (BLADE) ×2 IMPLANT
BLADE GREAT WHITE 4.2 (BLADE) IMPLANT
CANISTER OMNI JUG 16 LITER (MISCELLANEOUS) ×1 IMPLANT
CANISTER SUCTION 2500CC (MISCELLANEOUS) IMPLANT
CHLORAPREP W/TINT 26ML (MISCELLANEOUS) ×2 IMPLANT
CLOTH BEACON ORANGE TIMEOUT ST (SAFETY) ×2 IMPLANT
DRAPE ARTHROSCOPY W/POUCH 114 (DRAPES) ×2 IMPLANT
ELECT MENISCUS 165MM 90D (ELECTRODE) IMPLANT
ELECT REM PT RETURN 9FT ADLT (ELECTROSURGICAL)
ELECTRODE REM PT RTRN 9FT ADLT (ELECTROSURGICAL) IMPLANT
GAUZE XEROFORM 1X8 LF (GAUZE/BANDAGES/DRESSINGS) ×2 IMPLANT
GLOVE BIO SURGEON STRL SZ 6.5 (GLOVE) ×1 IMPLANT
GLOVE BIO SURGEON STRL SZ7 (GLOVE) ×2 IMPLANT
GLOVE BIO SURGEON STRL SZ7.5 (GLOVE) ×2 IMPLANT
GLOVE BIOGEL PI IND STRL 7.0 (GLOVE) ×1 IMPLANT
GLOVE BIOGEL PI IND STRL 8 (GLOVE) ×1 IMPLANT
GLOVE BIOGEL PI INDICATOR 7.0 (GLOVE) ×2
GLOVE BIOGEL PI INDICATOR 8 (GLOVE) ×1
GOWN PREVENTION PLUS XLARGE (GOWN DISPOSABLE) ×2 IMPLANT
KNEE WRAP E Z 3 GEL PACK (MISCELLANEOUS) ×2 IMPLANT
NDL FILTER BLUNT 18X1 1/2 (NEEDLE) ×1 IMPLANT
NDL SAFETY ECLIPSE 18X1.5 (NEEDLE) IMPLANT
NEEDLE FILTER BLUNT 18X 1/2SAF (NEEDLE)
NEEDLE FILTER BLUNT 18X1 1/2 (NEEDLE) IMPLANT
NEEDLE HYPO 18GX1.5 SHARP (NEEDLE)
PACK ARTHROSCOPY DSU (CUSTOM PROCEDURE TRAY) ×2 IMPLANT
PACK BASIN DAY SURGERY FS (CUSTOM PROCEDURE TRAY) ×2 IMPLANT
PENCIL BUTTON HOLSTER BLD 10FT (ELECTRODE) IMPLANT
SET ARTHROSCOPY TUBING (MISCELLANEOUS) ×2
SET ARTHROSCOPY TUBING LN (MISCELLANEOUS) ×1 IMPLANT
SLEEVE SCD COMPRESS KNEE MED (MISCELLANEOUS) IMPLANT
SPONGE GAUZE 4X4 12PLY (GAUZE/BANDAGES/DRESSINGS) ×2 IMPLANT
SYR 3ML 18GX1 1/2 (SYRINGE) IMPLANT
SYR 5ML LL (SYRINGE) ×2 IMPLANT
TOWEL OR 17X24 6PK STRL BLUE (TOWEL DISPOSABLE) ×2 IMPLANT
WAND STAR VAC 90 (SURGICAL WAND) IMPLANT
WATER STERILE IRR 1000ML POUR (IV SOLUTION) ×2 IMPLANT

## 2011-03-28 NOTE — Interval H&P Note (Signed)
History and Physical Interval Note:  03/28/2011 11:44 AM  Rebecca Graves  has presented today for surgery, with the diagnosis of lateral meniscus tear right knee  The various methods of treatment have been discussed with the patient and family. After consideration of risks, benefits and other options for treatment, the patient has consented to  Procedure(s): ARTHROSCOPY KNEE as a surgical intervention .  The patients' history has been reviewed, patient examined, no change in status, stable for surgery.  I have reviewed the patients' chart and labs.  Questions were answered to the patient's satisfaction.     Nestor Lewandowsky

## 2011-03-28 NOTE — Op Note (Signed)
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 Turner Daniels M.D.  Assist: Shirl Harris PA-C  Anes: General LMA  EBL: Minimal  Fluids: 800 cc  Indications: 71 year old female with catching popping and pain to the lateral aspect of her right knee for the last few months. No discrete injury. She is failed conservative treatment with over-the-counter anti-inflammatory medicines but did get good temporarily from an intra-articular cortisone injection about a month ago the pain relief lasted about 3 weeks. Her pain has recurred and she desires elective arthroscopic evaluation and treatment of her right knee wrist and benefits of surgery discussed questions answered  Procedure: Patient identified by arm band and taken [facility], the appropriate anesthetic monitors were attached, general LMA anesthesia was induced without difficulty. Lateral post was applied to the table and the right lower Charney prepped and draped in usual sterile fashion from the ankle to the midthigh. Time out procedure was performed. We began the operation by making standard inferior lateral and inferior medial peripatellar portals with a #11 blade allowing introduction of the arthroscope through the inferior lateral portal and the out flow to the inferior medial portal. Pump pressure was 100 mmHg and diagnostic arthroscopy revealed a normal patellofemoral joint, normal medial compartment, the anterior cruciate ligament and PCL were intact. On the lateral side the patient had complex tearing of the posterior lateral horns of the lateral meniscus as well as grade 3 chondromalacia of the lateral tibial condyle and a small area of chondromalacia to the lateral femoral condyle 8 mm x 10 mm in size. The chondromalacia  was debridement with a 3.5 mm Gator sucker shaver and a lateral meniscal tear was debridement with straight biters curved biters and a Gator sucker shaver. The meniscus was then thoroughly probed mesher was stable. The knee was irrigated out normal saline solution. A dressing of xerofoam 4 x 4 dressing sponges, web roll and an Ace wrap was applied. The patient was awakened extubated and taken to the recovery without difficulty.    Signed: Nestor Lewandowsky, MD

## 2011-03-28 NOTE — Transfer of Care (Signed)
Immediate Anesthesia Transfer of Care Note  Patient: Rebecca Graves  Procedure(s) Performed:  ARTHROSCOPY KNEE - with partial lateral meniscectomy, Debridement of Chondromalacia  Patient Location: PACU  Anesthesia Type: General  Level of Consciousness: awake, alert , oriented and patient cooperative  Airway & Oxygen Therapy: Patient Spontanous Breathing and Patient connected to face mask oxygen  Post-op Assessment: Report given to PACU RN and Post -op Vital signs reviewed and stable  Post vital signs: Reviewed and stable  Complications: No apparent anesthesia complications

## 2011-03-28 NOTE — Anesthesia Preprocedure Evaluation (Signed)
Anesthesia Evaluation  Patient identified by MRN, date of birth, ID band Patient awake    Reviewed: Allergy & Precautions, H&P , NPO status , Patient's Chart, lab work & pertinent test results, reviewed documented beta blocker date and time   Airway       Dental   Pulmonary neg pulmonary ROS, former smoker clear to auscultation  Pulmonary exam normal       Cardiovascular hypertension, On Medications and On Home Beta Blockers regular Normal- Systolic murmurs    Neuro/Psych Negative Neurological ROS     GI/Hepatic GERD-  Controlled,  Endo/Other  Negative Endocrine ROS  Renal/GU negative Renal ROS     Musculoskeletal   Abdominal Normal abdominal exam  (+)   Peds  Hematology   Anesthesia Other Findings   Reproductive/Obstetrics                           Anesthesia Physical Anesthesia Plan  ASA: II  Anesthesia Plan: General LMA   Post-op Pain Management:    Induction:   Airway Management Planned:   Additional Equipment:   Intra-op Plan:   Post-operative Plan:   Informed Consent: I have reviewed the patients History and Physical, chart, labs and discussed the procedure including the risks, benefits and alternatives for the proposed anesthesia with the patient or authorized representative who has indicated his/her understanding and acceptance.     Plan Discussed with: Anesthesiologist, CRNA and Surgeon  Anesthesia Plan Comments:         Anesthesia Quick Evaluation

## 2011-03-28 NOTE — Anesthesia Procedure Notes (Signed)
Procedure Name: LMA Insertion Date/Time: 03/28/2011 12:11 PM Performed by: Zahli Vetsch D Pre-anesthesia Checklist: Patient identified, Emergency Drugs available, Suction available and Patient being monitored Patient Re-evaluated:Patient Re-evaluated prior to inductionOxygen Delivery Method: Circle System Utilized Preoxygenation: Pre-oxygenation with 100% oxygen Intubation Type: IV induction Ventilation: Mask ventilation without difficulty LMA: LMA inserted LMA Size: 4.0 Number of attempts: 1 Placement Confirmation: positive ETCO2 Tube secured with: Tape Dental Injury: Teeth and Oropharynx as per pre-operative assessment

## 2011-03-28 NOTE — Anesthesia Postprocedure Evaluation (Signed)
Anesthesia Post Note  Patient: Rebecca Graves  Procedure(s) Performed:  ARTHROSCOPY KNEE - with partial lateral meniscectomy, Debridement of Chondromalacia  Anesthesia type: general  Patient location: PACU  Post pain: Pain level controlled  Post assessment: Patient's Cardiovascular Status Stable  Last Vitals:  Filed Vitals:   03/28/11 1330  BP: 151/72  Pulse: 51  Temp:   Resp: 9    Post vital signs: Reviewed and stable  Level of consciousness: sedated  Complications: No apparent anesthesia complications

## 2011-03-29 ENCOUNTER — Encounter (HOSPITAL_BASED_OUTPATIENT_CLINIC_OR_DEPARTMENT_OTHER): Payer: Self-pay | Admitting: Orthopedic Surgery

## 2011-06-08 ENCOUNTER — Other Ambulatory Visit (HOSPITAL_COMMUNITY): Payer: Self-pay | Admitting: Internal Medicine

## 2011-06-08 DIAGNOSIS — Z1231 Encounter for screening mammogram for malignant neoplasm of breast: Secondary | ICD-10-CM

## 2011-07-01 ENCOUNTER — Ambulatory Visit (HOSPITAL_COMMUNITY)
Admission: RE | Admit: 2011-07-01 | Discharge: 2011-07-01 | Disposition: A | Discharge status: Home / Self Care | Payer: Medicare Other | Source: Ambulatory Visit | Attending: Internal Medicine | Admitting: Internal Medicine

## 2011-07-01 DIAGNOSIS — Z1231 Encounter for screening mammogram for malignant neoplasm of breast: Secondary | ICD-10-CM

## 2012-05-31 ENCOUNTER — Other Ambulatory Visit (HOSPITAL_COMMUNITY): Payer: Self-pay | Admitting: Internal Medicine

## 2012-05-31 DIAGNOSIS — Z1231 Encounter for screening mammogram for malignant neoplasm of breast: Secondary | ICD-10-CM

## 2012-06-29 ENCOUNTER — Encounter: Payer: Self-pay | Admitting: Gastroenterology

## 2012-07-02 ENCOUNTER — Ambulatory Visit (HOSPITAL_COMMUNITY)
Admission: RE | Admit: 2012-07-02 | Discharge: 2012-07-02 | Disposition: A | Discharge status: Home / Self Care | Payer: Medicare HMO | Source: Ambulatory Visit | Attending: Internal Medicine | Admitting: Internal Medicine

## 2012-07-02 DIAGNOSIS — Z1231 Encounter for screening mammogram for malignant neoplasm of breast: Secondary | ICD-10-CM | POA: Insufficient documentation

## 2012-07-03 ENCOUNTER — Encounter: Payer: Self-pay | Admitting: Gastroenterology

## 2012-09-04 ENCOUNTER — Encounter: Payer: Medicare Other | Admitting: Gastroenterology

## 2012-09-26 ENCOUNTER — Encounter: Payer: Self-pay | Admitting: Gastroenterology

## 2012-11-15 ENCOUNTER — Ambulatory Visit (AMBULATORY_SURGERY_CENTER): Payer: Commercial Managed Care - HMO | Admitting: *Deleted

## 2012-11-15 VITALS — Ht 68.0 in | Wt 218.2 lb

## 2012-11-15 DIAGNOSIS — Z86010 Personal history of colonic polyps: Secondary | ICD-10-CM

## 2012-11-15 MED ORDER — MOVIPREP 100 G PO SOLR: 1.0000 | Freq: Once | ORAL | Status: DC

## 2012-11-15 NOTE — Progress Notes (Signed)
Denies allergies to eggs or soy products. Denies complications with sedation or anesthesia. 

## 2012-11-22 ENCOUNTER — Encounter: Payer: Self-pay | Admitting: Gastroenterology

## 2012-11-29 ENCOUNTER — Encounter: Payer: Self-pay | Admitting: Gastroenterology

## 2012-11-29 ENCOUNTER — Ambulatory Visit (AMBULATORY_SURGERY_CENTER): Payer: Medicare HMO | Admitting: Gastroenterology

## 2012-11-29 VITALS — BP 130/60 | HR 54 | Temp 96.3°F | Resp 12 | Ht 68.0 in | Wt 218.0 lb

## 2012-11-29 DIAGNOSIS — Z86010 Personal history of colonic polyps: Secondary | ICD-10-CM

## 2012-11-29 DIAGNOSIS — D126 Benign neoplasm of colon, unspecified: Secondary | ICD-10-CM

## 2012-11-29 MED ORDER — SODIUM CHLORIDE 0.9 % IV SOLN: 500.0000 mL | INTRAVENOUS | Status: DC

## 2012-11-29 NOTE — Patient Instructions (Signed)
YOU HAD AN ENDOSCOPIC PROCEDURE TODAY AT THE [facility]: Refer to the procedure report that was given to you for any specific questions about what was found during the examination.  If the procedure report does not answer your questions, please call your gastroenterologist to clarify.  If you requested that your care partner not be given the details of your procedure findings, then the procedure report has been included in a sealed envelope for you to review at your convenience later.  YOU SHOULD EXPECT: Some feelings of bloating in the abdomen. Passage of more gas than usual.  Walking can help get rid of the air that was put into your GI tract during the procedure and reduce the bloating. If you had a lower endoscopy (such as a colonoscopy or flexible sigmoidoscopy) you may notice spotting of blood in your stool or on the toilet paper. If you underwent a bowel prep for your procedure, then you may not have a normal bowel movement for a few days.  DIET: Your first meal following the procedure should be a light meal and then it is ok to progress to your normal diet.  A half-sandwich or bowl of soup is an example of a good first meal.  Heavy or fried foods are harder to digest and may make you feel nauseous or bloated.  Likewise meals heavy in dairy and vegetables can cause extra gas to form and this can also increase the bloating.  Drink plenty of fluids but you should avoid alcoholic beverages for 24 hours.  ACTIVITY: Your care partner should take you home directly after the procedure.  You should plan to take it easy, moving slowly for the rest of the day.  You can resume normal activity the day after the procedure however you should NOT DRIVE or use heavy machinery for 24 hours (because of the sedation medicines used during the test).    SYMPTOMS TO REPORT IMMEDIATELY: A gastroenterologist can be reached at any hour.  During normal business hours, 8:30 AM to 5:00 PM Monday through Friday,  call [phone].  After hours and on weekends, please call the GI answering service at [phone] who will take a message and have the physician on call contact you.   Following lower endoscopy (colonoscopy or flexible sigmoidoscopy):  Excessive amounts of blood in the stool  Significant tenderness or worsening of abdominal pains  Swelling of the abdomen that is new, acute  Fever of 100F or higher    FOLLOW UP: If any biopsies were taken you will be contacted by phone or by letter within the next 1-3 weeks.  Call your gastroenterologist if you have not heard about the biopsies in 3 weeks.  Our staff will call the home number listed on your records the next business day following your procedure to check on you and address any questions or concerns that you may have at that time regarding the information given to you following your procedure. This is a courtesy call and so if there is no answer at the home number and we have not heard from you through the emergency physician on call, we will assume that you have returned to your regular daily activities without incident.  SIGNATURES/CONFIDENTIALITY: You and/or your care partner have signed paperwork which will be entered into your electronic medical record.  These signatures attest to the fact that that the information above on your After Visit Summary has been reviewed and is understood.  Full responsibility of the confidentiality   of this discharge information lies with you and/or your care-partner.   INFORMATION ON POLYPS ,DIVERTICULOSIS ,HEMORRHOIDS, & HIGH FIBER DIET GIVEN TO YOU TODAY

## 2012-11-29 NOTE — Progress Notes (Signed)
Lidocaine-40mg IV prior to Propofol InductionPropofol given over incremental dosages 

## 2012-11-29 NOTE — Progress Notes (Signed)
Called to room to assist during endoscopic procedure.  Patient ID and intended procedure confirmed with present staff. Received instructions for my participation in the procedure from the performing physician.  

## 2012-11-29 NOTE — Op Note (Addendum)
[facility] [address] [place], [address]   COLONOSCOPY PROCEDURE REPORT PATIENT: Rebecca, Graves  MR#: [id number] BIRTHDATE: 06-Dec-1940 , 71  yrs. old GENDER: Female ENDOSCOPIST: Meryl Dare, MD, [facility] PROCEDURE DATE:  11/29/2012 PROCEDURE:   Colonoscopy with biopsy First Screening Colonoscopy - Avg.  risk and is 50 yrs.  old or older - No.  Prior Negative Screening - Now for repeat screening. N/A  History of Adenoma - Now for follow-up colonoscopy & has been > or = to 3 yrs.  Yes hx of adenoma.  Has been 3 or more years since last colonoscopy.  Polyps Removed Today? Yes. ASA CLASS:   Class II INDICATIONS:Patient's personal history of adenomatous colon polyps-history of polyp with HGD. MEDICATIONS: MAC sedation, administered by CRNA and propofol (Diprivan) 120mg  IV DESCRIPTION OF PROCEDURE:   After the risks benefits and alternatives of the procedure were thoroughly explained, informed consent was obtained.  A digital rectal exam revealed no abnormalities of the rectum.   The LB [id number]  endoscope was introduced through the anus and advanced to the cecum, which was identified by both the appendix and ileocecal valve. No adverse events experienced.   The quality of the prep was good, using MoviPrep  The instrument was then slowly withdrawn as the colon was fully examined.  COLON FINDINGS: Mild diverticulosis was noted in the sigmoid colon. A sessile polyp measuring 4 mm in size was found in rectum seen upon the retroflexed view.  A polypectomy was performed with cold forceps.  The resection was complete and the polyp tissue was completely retrieved. 1 cm lipoma in the transverse colon. The colon was otherwise normal.  There was no diverticulosis, inflammation, polyps or cancers unless previously stated. Retroflexed views revealed small internal hemorrhoids. The time to cecum=1 minutes 30 seconds.  Withdrawal time=10 minutes 10 seconds. The  scope was withdrawn and the procedure completed. COMPLICATIONS: There were no complications.  ENDOSCOPIC IMPRESSION: 1.   Mild diverticulosis was noted in the sigmoid colon 2.   Sessile polyp measuring 4 mm in rectum, seen on retroflexed view; polypectomy performed with cold forceps 3.   Small internal hemorrhoids 4.    Lipoma in the transverse colon  RECOMMENDATIONS: 1.  Await pathology results 2.  High fiber diet with liberal fluid intake. 3.  Repeat Colonoscopy in 5 years.  eSigned:  Meryl Dare, MD, [facility] 11/29/2012 10:56 AM Revised: 11/29/2012 10:56 AM  cc: [name], MD

## 2012-11-30 ENCOUNTER — Telehealth: Payer: Self-pay

## 2012-11-30 NOTE — Telephone Encounter (Signed)
  Follow up Call-  Call back number 11/29/2012  Post procedure Call Back phone  # [phone] hm  Permission to leave phone message Yes     Patient questions:  Do you have a fever, pain , or abdominal swelling? no Pain Score  0 *  Have you tolerated food without any problems? yes  Have you been able to return to your normal activities? yes  Do you have any questions about your discharge instructions: Diet   no Medications  no Follow up visit  no  Do you have questions or concerns about your Care? no  Actions: * If pain score is 4 or above: No action needed, pain <4.

## 2012-12-12 ENCOUNTER — Encounter: Payer: Self-pay | Admitting: Gastroenterology

## 2013-02-20 ENCOUNTER — Other Ambulatory Visit: Payer: Self-pay | Admitting: Internal Medicine

## 2013-03-05 NOTE — Telephone Encounter (Signed)
03/05/13 lmom for pt to call & sch F/U ov & labs = pt to call back to schedule. SB

## 2013-03-06 NOTE — Telephone Encounter (Signed)
appt scheduled 03/22/13

## 2013-03-19 ENCOUNTER — Other Ambulatory Visit: Payer: Self-pay | Admitting: Internal Medicine

## 2013-03-21 DIAGNOSIS — E785 Hyperlipidemia, unspecified: Secondary | ICD-10-CM

## 2013-03-21 DIAGNOSIS — E039 Hypothyroidism, unspecified: Secondary | ICD-10-CM | POA: Insufficient documentation

## 2013-03-21 DIAGNOSIS — M1990 Unspecified osteoarthritis, unspecified site: Secondary | ICD-10-CM | POA: Insufficient documentation

## 2013-03-21 DIAGNOSIS — H409 Unspecified glaucoma: Secondary | ICD-10-CM | POA: Insufficient documentation

## 2013-03-21 DIAGNOSIS — I10 Essential (primary) hypertension: Secondary | ICD-10-CM | POA: Insufficient documentation

## 2013-03-21 DIAGNOSIS — E1169 Type 2 diabetes mellitus with other specified complication: Secondary | ICD-10-CM | POA: Insufficient documentation

## 2013-03-21 DIAGNOSIS — K219 Gastro-esophageal reflux disease without esophagitis: Secondary | ICD-10-CM | POA: Insufficient documentation

## 2013-03-22 ENCOUNTER — Ambulatory Visit: Payer: Self-pay | Admitting: Internal Medicine

## 2013-03-31 DIAGNOSIS — E559 Vitamin D deficiency, unspecified: Secondary | ICD-10-CM | POA: Insufficient documentation

## 2013-03-31 DIAGNOSIS — Z79899 Other long term (current) drug therapy: Secondary | ICD-10-CM | POA: Insufficient documentation

## 2013-03-31 NOTE — Patient Instructions (Addendum)
Bad carbs also include fruit juice, alcohol, and sweet tea. These are empty calories that do not signal to your brain that you are full.   Please remember the good carbs are still carbs which convert into sugar. So please measure them out no more than 1/2-1 cup of rice, oatmeal, pasta, and beans.  Veggies are however free foods! Pile them on.   I like lean protein at every meal such as chicken, [place], pork chops, cottage cheese, etc. Just do not fry these meats and please center your meal around vegetable, the meats should be a side dish.   No all fruit is created equal. Please see the list below, the fruit at the bottom is higher in sugars than the fruit at the top   Edema Edema is an abnormal build-up of fluids in tissues. Because this is partly dependent on gravity (water flows to the lowest place), it is more common in the legs and thighs (lower extremities). It is also common in the looser tissues, like around the eyes. Painless swelling of the feet and ankles is common and increases as a person ages. It may affect both legs and may include the calves or even thighs. When squeezed, the fluid may move out of the affected area and may leave a dent for a few moments. CAUSES   Prolonged standing or sitting in one place for extended periods of time. Movement helps pump tissue fluid into the veins, and absence of movement prevents this, resulting in edema.  Varicose veins. The valves in the veins do not work as well as they should. This causes fluid to leak into the tissues.  Fluid and salt overload.  Injury, burn, or surgery to the leg, ankle, or foot, may damage veins and allow fluid to leak out.  Sunburn damages vessels. Leaky vessels allow fluid to go out into the sunburned tissues.  Allergies (from insect bites or stings, medications or chemicals) cause swelling by allowing vessels to become leaky.  Protein in the blood helps keep fluid in your vessels. Low protein, as in  malnutrition, allows fluid to leak out.  Hormonal changes, including pregnancy and menstruation, cause fluid retention. This fluid may leak out of vessels and cause edema.  Medications that cause fluid retention. Examples are sex hormones, blood pressure medications, steroid treatment, or anti-depressants.  Some illnesses cause edema, especially heart failure, kidney disease, or liver disease.  Surgery that cuts veins or lymph nodes, such as surgery done for the heart or for breast cancer, may result in edema. DIAGNOSIS  Your caregiver is usually easily able to determine what is causing your swelling (edema) by simply asking what is wrong (getting a history) and examining you (doing a physical). Sometimes x-rays, EKG (electrocardiogram or heart tracing), and blood work may be done to evaluate for underlying medical illness. TREATMENT  General treatment includes:  Leg elevation (or elevation of the affected body part).  Restriction of fluid intake.  Prevention of fluid overload.  Compression of the affected body part. Compression with elastic bandages or support stockings squeezes the tissues, preventing fluid from entering and forcing it back into the blood vessels.  Diuretics (also called water pills or fluid pills) pull fluid out of your body in the form of increased urination. These are effective in reducing the swelling, but can have side effects and must be used only under your caregiver's supervision. Diuretics are appropriate only for some types of edema. The specific treatment can be directed at any underlying  causes discovered. Heart, liver, or kidney disease should be treated appropriately. HOME CARE INSTRUCTIONS   Elevate the legs (or affected body part) above the level of the heart, while lying down.  Avoid sitting or standing still for prolonged periods of time.  Avoid putting anything directly under the knees when lying down, and do not wear constricting clothing or  garters on the upper legs.  Exercising the legs causes the fluid to work back into the veins and lymphatic channels. This may help the swelling go down.  The pressure applied by elastic bandages or support stockings can help reduce ankle swelling.  A low-salt diet may help reduce fluid retention and decrease the ankle swelling.  Take any medications exactly as prescribed. SEEK MEDICAL CARE IF:  Your edema is not responding to recommended treatments. SEEK IMMEDIATE MEDICAL CARE IF:   You develop shortness of breath or chest pain.  You cannot breathe when you lay down; or if, while lying down, you have to get up and go to the window to get your breath.  You are having increasing swelling without relief from treatment.  You develop a fever over 102 F (38.9 C).  You develop pain or redness in the areas that are swollen.  Tell your caregiver right away if you have gained 03 lb/1.4 kg in 1 day or 05 lb/2.3 kg in a week. MAKE SURE YOU:   Understand these instructions.  Will watch your condition.  Will get help right away if you are not doing well or get worse. Document Released: 02/14/2005 Document Revised: 08/16/2011 Document Reviewed: 10/03/2007 [facility] Patient Information 2014 Rebecca Graves.

## 2013-03-31 NOTE — Progress Notes (Signed)
Patient ID: Rebecca Graves, female   DOB: 1940/05/26, 73 y.o.   MRN: [id number]   HPI Patient presents for 3 month follow up with hypertension, hyperlipidemia, prediabetes and vitamin D. Missed previous appointment, was in [place].  Patient's blood pressure has been controlled at home, today their BP is BP: 110/62 mmHg  Patient denies chest pain, shortness of breath, dizziness.  Patient's cholesterol is diet controlled. The cholesterol last visit was LDL 96, HDL 78.  The patient has been working on diet and exercise for prediabetes, and denies changes in vision, polys, and paresthesias. A1C 5.8 (6.1)  Patient is on Vitamin D supplement.   She uses suppositories for her hemorrhoids occasionally and is out and would like a refill. Recent Colonoscopy with Dr. Fuller Plan on 11/2012 due 2019.,  Current Medications:  Current Outpatient Prescriptions on File Prior to Visit  Medication Sig Dispense Refill  . ALPRAZolam (XANAX) 1 MG tablet Take 1 mg by mouth at bedtime as needed for sleep.      . B Complex-C (SUPER B COMPLEX PO) Take 1 tablet by mouth daily.      . bimatoprost (LUMIGAN) 0.03 % ophthalmic solution 1 drop at bedtime.      . bisoprolol-hydrochlorothiazide (ZIAC) 10-6.25 MG per tablet TAKE 1 TABLET EVERY DAY FOR BLOOD PRESSURE  30 tablet  0  . Cholecalciferol (VITAMIN D-3) 5000 UNITS TABS Take 1 tablet by mouth daily.      Marland Kitchen estradiol (ESTRACE) 0.5 MG tablet TAKE 1 TABLET EVERY DAY  90 tablet  1  . furosemide (LASIX) 40 MG tablet Take 40 mg by mouth daily.      Marland Kitchen levothyroxine (SYNTHROID, LEVOTHROID) 125 MCG tablet Take 125 mcg by mouth daily.      . Magnesium 500 MG CAPS Take 3 capsules by mouth daily.      . meloxicam (MOBIC) 15 MG tablet TAKE 1 TABLET EVERY DAYAFTER A MEAL FOR ARTHRITIS PAIN  30 tablet  0  . omeprazole (PRILOSEC) 40 MG capsule TAKE 1 CAPSULE EVERY DAY  FOR  ACID  REFLUX  90 capsule  3  . Red Yeast Rice 600 MG CAPS Take 1 capsule by mouth daily.      Marland Kitchen rOPINIRole (REQUIP) 1 MG  tablet Take 1 mg by mouth 3 (three) times daily.      . timolol (TIMOPTIC) 0.5 % ophthalmic solution 1 drop 2 (two) times daily.      . traMADol (ULTRAM) 50 MG tablet Take 50 mg by mouth every 6 (six) hours as needed for pain.       No current facility-administered medications on file prior to visit.   Medical History:  Past Medical History  Diagnosis Date  . Arthritis   . Glaucoma   . Hypothyroidism   . Hypertension   . Hyperlipidemia   . GERD (gastroesophageal reflux disease)   . Prediabetes    Allergies: No Known Allergies  ROS Constitutional: Denies fever, chills, headaches, insomnia, fatigue, night sweats Eyes: Denies redness, blurred vision, diplopia, discharge, itchy, watery eyes.  ENT: Denies congestion, post nasal drip, sore throat, earache, dental pain, Tinnitus, Vertigo, Sinus pain, snoring.  Cardio: Denies chest pain, palpitations, irregular heartbeat, dyspnea, diaphoresis, orthopnea, PND, claudication, edema Respiratory: denies cough, shortness of breath, wheezing.  Gastrointestinal: Denies dysphagia, heartburn, AB pain/ cramps, N/V, diarrhea, constipation, hematemesis, melena, hematochezia,  hemorrhoids Genitourinary: Denies dysuria, frequency, urgency, nocturia, hesitancy, discharge, hematuria, flank pain Musculoskeletal: Denies myalgia, stiffness, pain, swelling and strain/sprain. Skin: Denies pruritis, rash, changing in  skin lesion Neuro: Denies Weakness, tremor, incoordination, spasms, pain Psychiatric: Denies confusion, memory loss, sensory loss Endocrine: Denies change in weight, skin, hair change, nocturia Diabetic Polys, Denies visual blurring, hyper /hypo glycemic episodes, and paresthesia, Heme/Lymph: Denies Excessive bleeding, bruising, enlarged lymph nodes  Family history- Review and unchanged Social history- Review and unchanged Physical Exam: Filed Vitals:   04/01/13 1251  BP: 110/62  Pulse: 60  Temp: 97.5 F (36.4 C)  Resp: 18   Filed Weights    04/01/13 1251  Weight: 215 lb 3.2 oz (97.614 kg)   General Appearance: Well nourished, in no apparent distress. Eyes: PERRLA, EOMs, conjunctiva no swelling or erythema Sinuses: No Frontal/maxillary tenderness ENT/Mouth: Ext aud canals clear, TMs without erythema, bulging. + middle ear effusion. No erythema, swelling, or exudate on post pharynx.  Tonsils not swollen or erythematous. Hearing normal.  Neck: Supple, thyroid normal.  Respiratory: Respiratory effort normal, BS equal bilaterally without rales, rhonchi, wheezing or stridor.  Cardio: RRR with no MRGs. Brisk peripheral pulses with 1+ edema Abdomen: Soft, + BS.  Non tender, no guarding, rebound, hernias, masses. Lymphatics: Non tender without lymphadenopathy.  Musculoskeletal: Full ROM, 5/5 strength, normal gait.  Skin: Warm, dry without rashes, lesions, ecchymosis.  Neuro: Cranial nerves intact. Normal muscle tone, no cerebellar symptoms. Sensation intact.  Psych: Awake and oriented X 3, normal affect, Insight and Judgment appropriate.   Assessment and Plan:  Hypertension: Continue medication, monitor blood pressure at home.  Continue DASH diet. Cholesterol: Continue diet and exercise. Check cholesterol.  Pre-diabetes-Continue diet and exercise. Check A1C Vitamin D Def- check level and continue medications.  Hemorrhoids- suppository sent to mail order Allergic rhinitis- Allegra OTC, increase H20, allergy hygiene explained.  Continue diet and meds as discussed. Further disposition pending results of labs.  Vicie Mutters 12:56 PM

## 2013-04-01 ENCOUNTER — Ambulatory Visit (INDEPENDENT_AMBULATORY_CARE_PROVIDER_SITE_OTHER): Payer: Medicare HMO | Admitting: Physician Assistant

## 2013-04-01 ENCOUNTER — Encounter: Payer: Self-pay | Admitting: Internal Medicine

## 2013-04-01 VITALS — BP 110/62 | HR 60 | Temp 97.5°F | Resp 18 | Wt 215.2 lb

## 2013-04-01 DIAGNOSIS — E039 Hypothyroidism, unspecified: Secondary | ICD-10-CM

## 2013-04-01 DIAGNOSIS — Z79899 Other long term (current) drug therapy: Secondary | ICD-10-CM

## 2013-04-01 DIAGNOSIS — E785 Hyperlipidemia, unspecified: Secondary | ICD-10-CM

## 2013-04-01 DIAGNOSIS — I10 Essential (primary) hypertension: Secondary | ICD-10-CM

## 2013-04-01 DIAGNOSIS — E559 Vitamin D deficiency, unspecified: Secondary | ICD-10-CM

## 2013-04-01 DIAGNOSIS — R7303 Prediabetes: Secondary | ICD-10-CM

## 2013-04-01 DIAGNOSIS — R7309 Other abnormal glucose: Secondary | ICD-10-CM

## 2013-04-01 DIAGNOSIS — E782 Mixed hyperlipidemia: Secondary | ICD-10-CM

## 2013-04-01 LAB — LIPID PANEL
CHOL/HDL RATIO: 2.3 ratio
CHOLESTEROL: 189 mg/dL (ref 0–200)
HDL: 81 mg/dL (ref >39)
LDL Cholesterol: 70 mg/dL (ref 0–99)
Triglycerides: 189 mg/dL — ABNORMAL HIGH (ref <150)
VLDL: 38 mg/dL (ref 0–40)

## 2013-04-01 LAB — HEPATIC FUNCTION PANEL
ALT: 19 U/L (ref 0–35)
AST: 18 U/L (ref 0–37)
Albumin: 4.6 g/dL (ref 3.5–5.2)
Alkaline Phosphatase: 49 U/L (ref 39–117)
BILIRUBIN DIRECT: 0.2 mg/dL (ref 0.0–0.3)
Indirect Bilirubin: 0.6 mg/dL (ref 0.2–1.2)
Total Bilirubin: 0.8 mg/dL (ref 0.2–1.2)
Total Protein: 7 g/dL (ref 6.0–8.3)

## 2013-04-01 LAB — CBC WITH DIFFERENTIAL/PLATELET
BASOS ABS: 0 10*3/uL (ref 0.0–0.1)
Basophils Relative: 1 % (ref 0–1)
EOS ABS: 0.2 10*3/uL (ref 0.0–0.7)
Eosinophils Relative: 3 % (ref 0–5)
HCT: 41 % (ref 36.0–46.0)
Hemoglobin: 14.2 g/dL (ref 12.0–15.0)
Lymphocytes Relative: 44 % (ref 12–46)
Lymphs Abs: 2.2 10*3/uL (ref 0.7–4.0)
MCH: 28.7 pg (ref 26.0–34.0)
MCHC: 34.6 g/dL (ref 30.0–36.0)
MCV: 82.8 fL (ref 78.0–100.0)
Monocytes Absolute: 0.5 10*3/uL (ref 0.1–1.0)
Monocytes Relative: 10 % (ref 3–12)
Neutro Abs: 2.1 10*3/uL (ref 1.7–7.7)
Neutrophils Relative %: 42 % — ABNORMAL LOW (ref 43–77)
PLATELETS: 241 10*3/uL (ref 150–400)
RBC: 4.95 MIL/uL (ref 3.87–5.11)
RDW: 13.6 % (ref 11.5–15.5)
WBC: 5 10*3/uL (ref 4.0–10.5)

## 2013-04-01 LAB — BASIC METABOLIC PANEL WITH GFR
BUN: 16 mg/dL (ref 6–23)
CALCIUM: 10.6 mg/dL — AB (ref 8.4–10.5)
CO2: 30 mEq/L (ref 19–32)
Chloride: 99 mEq/L (ref 96–112)
Creat: 1.05 mg/dL (ref 0.50–1.10)
GFR, EST AFRICAN AMERICAN: 61 mL/min
GFR, EST NON AFRICAN AMERICAN: 53 mL/min — AB
Glucose, Bld: 103 mg/dL — ABNORMAL HIGH (ref 70–99)
Potassium: 4.2 mEq/L (ref 3.5–5.3)
Sodium: 141 mEq/L (ref 135–145)

## 2013-04-01 LAB — HEMOGLOBIN A1C
HEMOGLOBIN A1C: 6.2 % — AB (ref <5.7)
Mean Plasma Glucose: 131 mg/dL — ABNORMAL HIGH (ref <117)

## 2013-04-01 LAB — TSH: TSH: 7.113 u[IU]/mL — AB (ref 0.350–4.500)

## 2013-04-01 LAB — MAGNESIUM: MAGNESIUM: 1.8 mg/dL (ref 1.5–2.5)

## 2013-04-01 MED ORDER — HYDROCORTISONE ACETATE 25 MG RE SUPP: 25.0000 mg | Freq: Two times a day (BID) | RECTAL | Status: DC

## 2013-04-02 ENCOUNTER — Other Ambulatory Visit: Payer: Self-pay

## 2013-04-02 LAB — INSULIN, FASTING: INSULIN FASTING, SERUM: 44 u[IU]/mL — AB (ref 3–28)

## 2013-04-02 LAB — VITAMIN D 25 HYDROXY (VIT D DEFICIENCY, FRACTURES): VIT D 25 HYDROXY: 97 ng/mL — AB (ref 30–89)

## 2013-04-02 MED ORDER — HYDROCORTISONE 2.5 % RE CREA: 1.0000 "application " | TOPICAL_CREAM | Freq: Two times a day (BID) | RECTAL | Status: DC

## 2013-04-10 ENCOUNTER — Other Ambulatory Visit: Payer: Self-pay | Admitting: Internal Medicine

## 2013-04-18 ENCOUNTER — Other Ambulatory Visit: Payer: Self-pay | Admitting: *Deleted

## 2013-04-18 ENCOUNTER — Other Ambulatory Visit: Payer: Self-pay | Admitting: Emergency Medicine

## 2013-04-18 MED ORDER — BISOPROLOL-HYDROCHLOROTHIAZIDE 10-6.25 MG PO TABS: ORAL_TABLET | ORAL | Status: DC

## 2013-04-30 ENCOUNTER — Other Ambulatory Visit: Payer: Commercial Managed Care - HMO

## 2013-04-30 DIAGNOSIS — E039 Hypothyroidism, unspecified: Secondary | ICD-10-CM

## 2013-04-30 LAB — TSH: TSH: 1.432 u[IU]/mL (ref 0.350–4.500)

## 2013-05-27 ENCOUNTER — Other Ambulatory Visit: Payer: Self-pay | Admitting: Internal Medicine

## 2013-05-27 ENCOUNTER — Other Ambulatory Visit: Payer: Self-pay

## 2013-05-27 MED ORDER — ALPRAZOLAM 1 MG PO TABS: 1.0000 mg | ORAL_TABLET | Freq: Every evening | ORAL | Status: DC | PRN

## 2013-06-04 ENCOUNTER — Other Ambulatory Visit: Payer: Self-pay | Admitting: Internal Medicine

## 2013-06-04 DIAGNOSIS — Z1231 Encounter for screening mammogram for malignant neoplasm of breast: Secondary | ICD-10-CM

## 2013-07-02 ENCOUNTER — Encounter: Payer: Self-pay | Admitting: Internal Medicine

## 2013-07-02 ENCOUNTER — Ambulatory Visit (INDEPENDENT_AMBULATORY_CARE_PROVIDER_SITE_OTHER): Payer: Commercial Managed Care - HMO | Admitting: Internal Medicine

## 2013-07-02 VITALS — BP 126/80 | HR 60 | Temp 97.5°F | Resp 16 | Ht 68.0 in | Wt 216.2 lb

## 2013-07-02 DIAGNOSIS — Z79899 Other long term (current) drug therapy: Secondary | ICD-10-CM

## 2013-07-02 DIAGNOSIS — E559 Vitamin D deficiency, unspecified: Secondary | ICD-10-CM

## 2013-07-02 DIAGNOSIS — R7303 Prediabetes: Secondary | ICD-10-CM

## 2013-07-02 DIAGNOSIS — Z1331 Encounter for screening for depression: Secondary | ICD-10-CM

## 2013-07-02 DIAGNOSIS — I10 Essential (primary) hypertension: Secondary | ICD-10-CM

## 2013-07-02 DIAGNOSIS — E785 Hyperlipidemia, unspecified: Secondary | ICD-10-CM

## 2013-07-02 DIAGNOSIS — Z0000 Encounter for general adult medical examination without abnormal findings: Secondary | ICD-10-CM

## 2013-07-02 DIAGNOSIS — Z789 Other specified health status: Secondary | ICD-10-CM

## 2013-07-02 DIAGNOSIS — Z1212 Encounter for screening for malignant neoplasm of rectum: Secondary | ICD-10-CM

## 2013-07-02 LAB — CBC WITH DIFFERENTIAL/PLATELET
BASOS PCT: 1 % (ref 0–1)
Basophils Absolute: 0 10*3/uL (ref 0.0–0.1)
EOS ABS: 0.1 10*3/uL (ref 0.0–0.7)
Eosinophils Relative: 4 % (ref 0–5)
HCT: 40.1 % (ref 36.0–46.0)
Hemoglobin: 14 g/dL (ref 12.0–15.0)
LYMPHS ABS: 1.9 10*3/uL (ref 0.7–4.0)
Lymphocytes Relative: 54 % — ABNORMAL HIGH (ref 12–46)
MCH: 28.2 pg (ref 26.0–34.0)
MCHC: 34.9 g/dL (ref 30.0–36.0)
MCV: 80.7 fL (ref 78.0–100.0)
MONOS PCT: 9 % (ref 3–12)
Monocytes Absolute: 0.3 10*3/uL (ref 0.1–1.0)
NEUTROS ABS: 1.1 10*3/uL — AB (ref 1.7–7.7)
Neutrophils Relative %: 32 % — ABNORMAL LOW (ref 43–77)
PLATELETS: 232 10*3/uL (ref 150–400)
RBC: 4.97 MIL/uL (ref 3.87–5.11)
RDW: 14.1 % (ref 11.5–15.5)
WBC: 3.5 10*3/uL — ABNORMAL LOW (ref 4.0–10.5)

## 2013-07-02 LAB — HEMOGLOBIN A1C
Hgb A1c MFr Bld: 5.9 % — ABNORMAL HIGH (ref <5.7)
Mean Plasma Glucose: 123 mg/dL — ABNORMAL HIGH (ref <117)

## 2013-07-02 NOTE — Progress Notes (Signed)
Patient ID: Rebecca Graves, female   DOB: 11-22-1940, 73 y.o.   MRN: [id number]   Annual Screening Comprehensive Examination  This very nice 73 y.o. SepBF presents for complete physical.  Patient has been followed for HTN, Prediabetes, Hyperlipidemia, and Vitamin D Deficiency.    HTN predates since 1997. Patient's BP has been controlled at home. Today's BP: 126/80 mmHg. Patient denies any cardiac symptoms as chest pain, palpitations, shortness of breath, dizziness or ankle swelling.   Patient's hyperlipidemia is controlled with diet and medications. Patient denies myalgias or other medication SE's. Last cholesterol as below is at goal.  Lab Results  Component Value Date   CHOL 189 04/01/2013   HDL 81 04/01/2013   LDLCALC 70 04/01/2013   TRIG 189* 04/01/2013   CHOLHDL 2.3 04/01/2013    Patient has prediabetes with A1c 5.8%  predating since Dec 2009 and as high as 6.5% in April 2011      and last A1c was 6.2% in Feb 2015. Patient denies reactive hypoglycemic symptoms, visual blurring, diabetic polys, or paresthesias.    Finally, patient has history of Vitamin D Deficiency of 28 in 2008 and last vitamin D was 79 in 2014.  Medication Sig  . ALPRAZolam (XANAX) 1 MG tablet Take 1 tablet (1 mg total) by mouth at bedtime as needed for sleep.  . SUPER B COMPLEX  Take 1 tablet by mouth daily.  . bimatoprost (LUMIGAN) 0.03 % opht  1 drop at bedtime.  . bisoprolol-hctz (ZIAC) 10-6.25  TAKE 1 TABLET EVERY DAY FOR BLOOD PRESSURE  . Cholecalciferol (VITAMIN D) 5000 UNITS  Take 1 tablet by mouth daily.  Marland Kitchen estradiol (ESTRACE) 0.5 MG tablet TAKE 1 TABLET EVERY DAY  . furosemide (LASIX) 40 MG tablet TAKE 1 TABLET EVERY DAY  FOR  BLOOD  PRESSURE/FLUID  . levothyroxine  125 MCG tablet TAKE 1 TABLET EVERY DAY  OR AS DIRECTED  . Magnesium 500 MG CAPS Take 3 capsules by mouth daily.  . meloxicam (MOBIC) 15 MG tablet TAKE 1 TABLET EVERY DAYAFTER A MEAL FOR ARTHRITIS PAIN  . omeprazole (PRILOSEC) 40 MG  TAKE 1 CAPSULE  EVERY DAY  FOR  ACID  REFLUX  . Red Yeast Rice 600 MG CAPS Take 1 capsule by mouth daily.  Marland Kitchen rOPINIRole (REQUIP) 1 MG tablet Take 1 mg by mouth 3 (three) times daily. prn  . timolol (TIMOPTIC) 0.5 % ophth 1 drop 2 (two) times daily.  . traMADol (ULTRAM) 50 MG tablet Take 50 mg by mouth every 6 (six) hours as needed for pain.   No Known Allergies  Past Medical History  Diagnosis Date  . Arthritis   . Glaucoma   . Hypothyroidism   . Hypertension   . Hyperlipidemia   . GERD (gastroesophageal reflux disease)   . Prediabetes     Past Surgical History  Procedure Laterality Date  . Abdominal hysterectomy    . Appendectomy    . Joint replacement  2007    rt hip replacement  . Bladder suspension    . Knee arthroscopy  03/28/2011    Procedure: ARTHROSCOPY KNEE;  Surgeon: Kerin Salen, MD;  Location: [facility];  Service: Orthopedics;  Laterality: Right;  with partial lateral meniscectomy, Debridement of Chondromalacia    Family History  Problem Relation Age of Onset  . Colon cancer Neg Hx   . Rectal cancer Neg Hx   . Stomach cancer Neg Hx   . Esophageal cancer Other  nefew- age 65    History  Substance Use Topics  . Smoking status: Former Smoker    Quit date: 03/22/1996  . Smokeless tobacco: Never Used  . Alcohol Use: 1.0 oz/week    2 drink(s) per week     Comment: occ    ROS Constitutional: Denies fever, chills, weight loss/gain, headaches, insomnia, fatigue, night sweats, and change in appetite. Eyes: Denies redness, blurred vision, diplopia, discharge, itchy, watery eyes.  ENT: Denies discharge, congestion, post nasal drip, epistaxis, sore throat, earache, hearing loss, dental pain, Tinnitus, Vertigo, Sinus pain, snoring.  Cardio: Denies chest pain, palpitations, irregular heartbeat, syncope, dyspnea, diaphoresis, orthopnea, PND, claudication, edema Respiratory: denies cough, dyspnea, DOE, pleurisy, hoarseness, laryngitis, wheezing.  Gastrointestinal:  Denies dysphagia, heartburn, reflux, water brash, pain, cramps, nausea, vomiting, bloating, diarrhea, constipation, hematemesis, melena, hematochezia, jaundice, hemorrhoids Genitourinary: Denies dysuria, frequency, urgency, nocturia, hesitancy, discharge, hematuria, flank pain Breast:Breast lumps, nipple discharge, bleeding.  Musculoskeletal: Denies arthralgia, myalgia, stiffness, Jt. Swelling, pain, limp, and strain/sprain. Skin: Denies puritis, rash, hives, warts, acne, eczema, changing in skin lesion Neuro: No weakness, tremor, incoordination, spasms, paresthesia, pain Psychiatric: Denies confusion, memory loss, sensory loss Endocrine: Denies change in weight, skin, hair change, nocturia, and paresthesia, diabetic polys, visual blurring, hyper / hypo glycemic episodes.  Heme/Lymph: No excessive bleeding, bruising, enlarged lymph nodes.  Physical Exam  BP 126/80  Pulse 60  Temp 97.5 F   Resp 16  Ht 5\' 8"    Wt 216 lb 3.2 oz   BMI 32.88 kg/m2  General Appearance: Well nourished, in no apparent distress. Eyes: PERRLA, EOMs, conjunctiva no swelling or erythema, normal fundi and vessels. Sinuses: No frontal/maxillary tenderness ENT/Mouth: EACs patent / TMs  nl. Nares clear without erythema, swelling, mucoid exudates. Oral hygiene is good. No erythema, swelling, or exudate. Tongue normal, non-obstructing. Tonsils not swollen or erythematous. Hearing normal.  Neck: Supple, thyroid normal. No bruits, nodes or JVD. Respiratory: Respiratory effort normal.  BS equal and clear bilateral without rales, rhonci, wheezing or stridor. Cardio: Heart sounds are normal with regular rate and rhythm and no murmurs, rubs or gallops. Peripheral pulses are normal and equal bilaterally without edema. No aortic or femoral bruits. Chest: symmetric with normal excursions and percussion. Breasts: Symmetric, without lumps, nipple discharge, retractions, or fibrocystic changes.  Abdomen: Flat, soft, with bowl  sounds. Nontender, no guarding, rebound, hernias, masses, or organomegaly.  Lymphatics: Non tender without lymphadenopathy.  Genitourinary:  Musculoskeletal: Full ROM all peripheral extremities, joint stability, 5/5 strength, and normal gait. Skin: Warm and dry without rashes, lesions, cyanosis, clubbing or  ecchymosis.  Neuro: Cranial nerves intact, reflexes equal bilaterally. Normal muscle tone, no cerebellar symptoms. Sensation intact.  Pysch: Awake and oriented X 3, normal affect, Insight and Judgment appropriate.   Assessment and Plan  1. Annual Screening Examination 2. Hypertension  3. Hyperlipidemia 4. Pre Diabetes 5. Vitamin D Deficiency 6. Obesity (BMI 33)  Continue prudent diet as discussed, weight control, BP monitoring, regular exercise, and medications. Discussed med's effects and SE's. Screening labs and tests as requested with regular follow-up as recommended.

## 2013-07-02 NOTE — Patient Instructions (Signed)

## 2013-07-03 LAB — BASIC METABOLIC PANEL WITH GFR
BUN: 14 mg/dL (ref 6–23)
CO2: 28 mEq/L (ref 19–32)
Calcium: 10.1 mg/dL (ref 8.4–10.5)
Chloride: 101 mEq/L (ref 96–112)
Creat: 1.19 mg/dL — ABNORMAL HIGH (ref 0.50–1.10)
GFR, EST AFRICAN AMERICAN: 53 mL/min — AB
GFR, Est Non African American: 46 mL/min — ABNORMAL LOW
Glucose, Bld: 109 mg/dL — ABNORMAL HIGH (ref 70–99)
POTASSIUM: 3.8 meq/L (ref 3.5–5.3)
SODIUM: 140 meq/L (ref 135–145)

## 2013-07-03 LAB — MICROALBUMIN / CREATININE URINE RATIO
CREATININE, URINE: 72.7 mg/dL
MICROALB UR: 0.5 mg/dL (ref 0.00–1.89)
Microalb Creat Ratio: 6.9 mg/g (ref 0.0–30.0)

## 2013-07-03 LAB — URINALYSIS, MICROSCOPIC ONLY
Bacteria, UA: NONE SEEN
Crystals: NONE SEEN

## 2013-07-03 LAB — HEPATIC FUNCTION PANEL
ALT: 23 U/L (ref 0–35)
AST: 32 U/L (ref 0–37)
Albumin: 4.3 g/dL (ref 3.5–5.2)
Alkaline Phosphatase: 51 U/L (ref 39–117)
BILIRUBIN DIRECT: 0.2 mg/dL (ref 0.0–0.3)
BILIRUBIN INDIRECT: 0.9 mg/dL (ref 0.2–1.2)
Total Bilirubin: 1.1 mg/dL (ref 0.2–1.2)
Total Protein: 6.9 g/dL (ref 6.0–8.3)

## 2013-07-03 LAB — VITAMIN D 25 HYDROXY (VIT D DEFICIENCY, FRACTURES): VIT D 25 HYDROXY: 81 ng/mL (ref 30–89)

## 2013-07-03 LAB — LIPID PANEL
CHOL/HDL RATIO: 2.3 ratio
Cholesterol: 170 mg/dL (ref 0–200)
HDL: 75 mg/dL (ref >39)
LDL Cholesterol: 58 mg/dL (ref 0–99)
Triglycerides: 184 mg/dL — ABNORMAL HIGH (ref <150)
VLDL: 37 mg/dL (ref 0–40)

## 2013-07-03 LAB — INSULIN, FASTING: Insulin fasting, serum: 38 u[IU]/mL — ABNORMAL HIGH (ref 3–28)

## 2013-07-03 LAB — TSH: TSH: 0.182 u[IU]/mL — ABNORMAL LOW (ref 0.350–4.500)

## 2013-07-03 LAB — MAGNESIUM: MAGNESIUM: 1.8 mg/dL (ref 1.5–2.5)

## 2013-07-08 ENCOUNTER — Other Ambulatory Visit: Payer: Self-pay

## 2013-07-08 DIAGNOSIS — H3582 Retinal ischemia: Secondary | ICD-10-CM

## 2013-07-09 ENCOUNTER — Ambulatory Visit (HOSPITAL_COMMUNITY)
Admission: RE | Admit: 2013-07-09 | Discharge: 2013-07-09 | Disposition: A | Discharge status: Home / Self Care | Payer: Medicare HMO | Source: Ambulatory Visit | Attending: Internal Medicine | Admitting: Internal Medicine

## 2013-07-09 DIAGNOSIS — Z1231 Encounter for screening mammogram for malignant neoplasm of breast: Secondary | ICD-10-CM

## 2013-07-10 ENCOUNTER — Ambulatory Visit
Admission: RE | Admit: 2013-07-10 | Discharge: 2013-07-10 | Disposition: A | Discharge status: Home / Self Care | Payer: Commercial Managed Care - HMO | Source: Ambulatory Visit | Attending: Internal Medicine | Admitting: Internal Medicine

## 2013-07-10 DIAGNOSIS — H3582 Retinal ischemia: Secondary | ICD-10-CM

## 2013-07-15 ENCOUNTER — Other Ambulatory Visit (INDEPENDENT_AMBULATORY_CARE_PROVIDER_SITE_OTHER): Payer: Commercial Managed Care - HMO

## 2013-07-15 DIAGNOSIS — Z1212 Encounter for screening for malignant neoplasm of rectum: Secondary | ICD-10-CM

## 2013-07-15 LAB — POC HEMOCCULT BLD/STL (HOME/3-CARD/SCREEN)
Card #3 Fecal Occult Blood, POC: NEGATIVE
FECAL OCCULT BLD: NEGATIVE
Fecal Occult Blood, POC: NEGATIVE

## 2013-07-17 ENCOUNTER — Other Ambulatory Visit: Payer: Self-pay | Admitting: Internal Medicine

## 2013-07-25 ENCOUNTER — Other Ambulatory Visit: Payer: Self-pay | Admitting: Emergency Medicine

## 2013-07-26 ENCOUNTER — Other Ambulatory Visit: Payer: Self-pay | Admitting: Emergency Medicine

## 2013-07-26 MED ORDER — ESTRADIOL 0.5 MG PO TABS: ORAL_TABLET | ORAL | Status: DC

## 2013-08-06 ENCOUNTER — Ambulatory Visit (INDEPENDENT_AMBULATORY_CARE_PROVIDER_SITE_OTHER): Payer: Commercial Managed Care - HMO | Admitting: *Deleted

## 2013-08-06 DIAGNOSIS — E039 Hypothyroidism, unspecified: Secondary | ICD-10-CM

## 2013-08-06 NOTE — Progress Notes (Signed)
Patient ID: Rebecca Graves, female   DOB: 1940-06-24, 73 y.o.   MRN: [id number] Patient presents for 1 month recheck TSH per Dr. Idell Pickles orders.  Patient currently taking 125 mcg times 3 days per week (M,W,F0) and 1/2 pill times 4 days per week.

## 2013-08-07 LAB — TSH: TSH: 7.761 u[IU]/mL — ABNORMAL HIGH (ref 0.350–4.500)

## 2013-09-09 ENCOUNTER — Other Ambulatory Visit: Payer: Self-pay | Admitting: Internal Medicine

## 2013-09-09 DIAGNOSIS — E039 Hypothyroidism, unspecified: Secondary | ICD-10-CM

## 2013-09-10 ENCOUNTER — Other Ambulatory Visit: Payer: Self-pay | Admitting: Internal Medicine

## 2013-09-10 ENCOUNTER — Ambulatory Visit (INDEPENDENT_AMBULATORY_CARE_PROVIDER_SITE_OTHER): Payer: Commercial Managed Care - HMO

## 2013-09-10 DIAGNOSIS — E039 Hypothyroidism, unspecified: Secondary | ICD-10-CM

## 2013-09-10 NOTE — Progress Notes (Signed)
Patient ID: Rebecca Graves, female   DOB: [birth year], 73 y.o.   MRN: [id number] Patient here today to recheck TSH. Patients currently taking 125 mcg Levothyroxine, patient takes one pill Tues, Thurs, Sat and Sun and a half tablet on Mon, Wed and Friday.

## 2013-09-11 LAB — TSH: TSH: 4.281 u[IU]/mL (ref 0.350–4.500)

## 2013-09-12 ENCOUNTER — Telehealth: Payer: Self-pay

## 2013-09-12 NOTE — Telephone Encounter (Signed)
Leafy Ro with [facility] called requesting lab results. Per pt, okay to fax results. Results were faxed [phone]

## 2013-09-16 ENCOUNTER — Other Ambulatory Visit: Payer: Self-pay | Admitting: *Deleted

## 2013-09-16 MED ORDER — BISOPROLOL-HYDROCHLOROTHIAZIDE 10-6.25 MG PO TABS: ORAL_TABLET | ORAL | Status: DC

## 2013-09-26 ENCOUNTER — Other Ambulatory Visit: Payer: Self-pay | Admitting: *Deleted

## 2013-09-26 MED ORDER — ESTRADIOL 0.5 MG PO TABS
ORAL_TABLET | ORAL | Status: DC
Start: 2013-09-26 — End: 2013-11-26

## 2013-10-03 ENCOUNTER — Encounter: Payer: Self-pay | Admitting: Physician Assistant

## 2013-10-03 ENCOUNTER — Ambulatory Visit (INDEPENDENT_AMBULATORY_CARE_PROVIDER_SITE_OTHER): Payer: Commercial Managed Care - HMO | Admitting: Physician Assistant

## 2013-10-03 VITALS — BP 102/60 | HR 60 | Temp 97.7°F | Resp 16 | Wt 222.0 lb

## 2013-10-03 DIAGNOSIS — N3000 Acute cystitis without hematuria: Secondary | ICD-10-CM

## 2013-10-03 DIAGNOSIS — Z79899 Other long term (current) drug therapy: Secondary | ICD-10-CM

## 2013-10-03 DIAGNOSIS — Z789 Other specified health status: Secondary | ICD-10-CM

## 2013-10-03 DIAGNOSIS — R7303 Prediabetes: Secondary | ICD-10-CM

## 2013-10-03 DIAGNOSIS — E2839 Other primary ovarian failure: Secondary | ICD-10-CM

## 2013-10-03 DIAGNOSIS — I10 Essential (primary) hypertension: Secondary | ICD-10-CM

## 2013-10-03 DIAGNOSIS — Z1331 Encounter for screening for depression: Secondary | ICD-10-CM

## 2013-10-03 DIAGNOSIS — E559 Vitamin D deficiency, unspecified: Secondary | ICD-10-CM

## 2013-10-03 DIAGNOSIS — E039 Hypothyroidism, unspecified: Secondary | ICD-10-CM

## 2013-10-03 DIAGNOSIS — Z0000 Encounter for general adult medical examination without abnormal findings: Secondary | ICD-10-CM

## 2013-10-03 DIAGNOSIS — E785 Hyperlipidemia, unspecified: Secondary | ICD-10-CM

## 2013-10-03 LAB — CBC WITH DIFFERENTIAL/PLATELET
BASOS ABS: 0 10*3/uL (ref 0.0–0.1)
BASOS PCT: 1 % (ref 0–1)
EOS ABS: 0.1 10*3/uL (ref 0.0–0.7)
EOS PCT: 3 % (ref 0–5)
HEMATOCRIT: 39 % (ref 36.0–46.0)
Hemoglobin: 13.7 g/dL (ref 12.0–15.0)
Lymphocytes Relative: 55 % — ABNORMAL HIGH (ref 12–46)
Lymphs Abs: 2.6 10*3/uL (ref 0.7–4.0)
MCH: 27.9 pg (ref 26.0–34.0)
MCHC: 35.1 g/dL (ref 30.0–36.0)
MCV: 79.4 fL (ref 78.0–100.0)
MONO ABS: 0.5 10*3/uL (ref 0.1–1.0)
Monocytes Relative: 10 % (ref 3–12)
NEUTROS ABS: 1.5 10*3/uL — AB (ref 1.7–7.7)
Neutrophils Relative %: 31 % — ABNORMAL LOW (ref 43–77)
Platelets: 225 10*3/uL (ref 150–400)
RBC: 4.91 MIL/uL (ref 3.87–5.11)
RDW: 14.4 % (ref 11.5–15.5)
WBC: 4.8 10*3/uL (ref 4.0–10.5)

## 2013-10-03 LAB — HEMOGLOBIN A1C
HEMOGLOBIN A1C: 6.3 % — AB (ref <5.7)
Mean Plasma Glucose: 134 mg/dL — ABNORMAL HIGH (ref <117)

## 2013-10-03 NOTE — Patient Instructions (Signed)
If you ever feel dizzy or a lot of fatigue- try to cut the Ziac in half  Preventative Care for Adults - Female      [place]:  A routine yearly physical is a good way to check in with your primary care provider about your health and preventive screening. It is also an opportunity to share updates about your health and any concerns you have, and receive a thorough all-over exam.   Most health insurance companies pay for at least some preventative services.  Check with your health plan for specific coverages.  WHAT PREVENTATIVE SERVICES DO WOMEN NEED?  Adult women should have their weight and blood pressure checked regularly.   Women age 57 and older should have their cholesterol levels checked regularly.  Women should be screened for cervical cancer with a Pap smear and pelvic exam beginning at either age 44, or 3 years after they become sexually activity.    Breast cancer screening generally begins at age 107 with a mammogram and breast exam by your primary care provider.    Beginning at age 7 and continuing to age 36, women should be screened for colorectal cancer.  Certain people may need continued testing until age 65.  Updating vaccinations is part of preventative care.  Vaccinations help protect against diseases such as the flu.  Osteoporosis is a disease in which the bones lose minerals and strength as we age. Women ages 76 and over should discuss this with their caregivers, as should women after menopause who have other risk factors.  Lab tests are generally done as part of preventative care to screen for anemia and blood disorders, to screen for problems with the kidneys and liver, to screen for bladder problems, to check blood sugar, and to check your cholesterol level.  Preventative services generally include counseling about diet, exercise, avoiding tobacco, drugs, excessive alcohol consumption, and sexually transmitted infections.    GENERAL  RECOMMENDATIONS FOR GOOD HEALTH:  Healthy diet:  Eat a variety of foods, including fruit, vegetables, animal or vegetable protein, such as meat, fish, chicken, and eggs, or beans, lentils, tofu, and grains, such as rice.  Drink plenty of water daily.  Decrease saturated fat in the diet, avoid lots of red meat, processed foods, sweets, fast foods, and fried foods.  Exercise:  Aerobic exercise helps maintain good heart health. At least 30-40 minutes of moderate-intensity exercise is recommended. For example, a brisk walk that increases your heart rate and breathing. This should be done on most days of the week.   Find a type of exercise or a variety of exercises that you enjoy so that it becomes a part of your daily life.  Examples are running, walking, swimming, water aerobics, and biking.  For motivation and support, explore group exercise such as aerobic class, spin class, Zumba, Yoga,or  martial arts, etc.    Set exercise goals for yourself, such as a certain weight goal, walk or run in a race such as a 5k walk/run.  Speak to your primary care provider about exercise goals.  Disease prevention:  If you smoke or chew tobacco, find out from your caregiver how to quit. It can literally save your life, no matter how long you have been a tobacco user. If you do not use tobacco, never begin.   Maintain a healthy diet and normal weight. Increased weight leads to problems with blood pressure and diabetes.   The Body Mass Index or BMI is a way of measuring  how much of your body is fat. Having a BMI above 27 increases the risk of heart disease, diabetes, hypertension, stroke and other problems related to obesity. Your caregiver can help determine your BMI and based on it develop an exercise and dietary program to help you achieve or maintain this important measurement at a healthful level.  High blood pressure causes heart and blood vessel problems.  Persistent high blood pressure should be treated  with medicine if weight loss and exercise do not work.   Fat and cholesterol leaves deposits in your arteries that can block them. This causes heart disease and vessel disease elsewhere in your body.  If your cholesterol is found to be high, or if you have heart disease or certain other medical conditions, then you may need to have your cholesterol monitored frequently and be treated with medication.   Ask if you should have a cardiac stress test if your history suggests this. A stress test is a test done on a treadmill that looks for heart disease. This test can find disease prior to there being a problem.  Menopause can be associated with physical symptoms and risks. Hormone replacement therapy is available to decrease these. You should talk to your caregiver about whether starting or continuing to take hormones is right for you.   Osteoporosis is a disease in which the bones lose minerals and strength as we age. This can result in serious bone fractures. Risk of osteoporosis can be identified using a bone density scan. Women ages 78 and over should discuss this with their caregivers, as should women after menopause who have other risk factors. Ask your caregiver whether you should be taking a calcium supplement and Vitamin D, to reduce the rate of osteoporosis.   Avoid drinking alcohol in excess (more than two drinks per day).  Avoid use of street drugs. Do not share needles with anyone. Ask for professional help if you need assistance or instructions on stopping the use of alcohol, cigarettes, and/or drugs.  Brush your teeth twice a day with fluoride toothpaste, and floss once a day. Good oral hygiene prevents tooth decay and gum disease. The problems can be painful, unattractive, and can cause other health problems. Visit your dentist for a routine oral and dental check up and preventive care every 6-12 months.   Look at your skin regularly.  Use a mirror to look at your back. Notify your  caregivers of changes in moles, especially if there are changes in shapes, colors, a size larger than a pencil eraser, an irregular border, or development of new moles.  Safety:  Use seatbelts 100% of the time, whether driving or as a passenger.  Use safety devices such as hearing protection if you work in environments with loud noise or significant background noise.  Use safety glasses when doing any work that could send debris in to the eyes.  Use a helmet if you ride a bike or motorcycle.  Use appropriate safety gear for contact sports.  Talk to your caregiver about gun safety.  Use sunscreen with a SPF (or skin protection factor) of 15 or greater.  Lighter skinned people are at a greater risk of skin cancer. Don't forget to also wear sunglasses in order to protect your eyes from too much damaging sunlight. Damaging sunlight can accelerate cataract formation.   Practice safe sex. Use condoms. Condoms are used for birth control and to help reduce the spread of sexually transmitted infections (or STIs).  Some of the  STIs are gonorrhea (the clap), chlamydia, syphilis, trichomonas, herpes, HPV (human papilloma virus) and HIV (human immunodeficiency virus) which causes AIDS. The herpes, HIV and HPV are viral illnesses that have no cure. These can result in disability, cancer and death.   Keep carbon monoxide and smoke detectors in your home functioning at all times. Change the batteries every 6 months or use a model that plugs into the wall.   Vaccinations:  Stay up to date with your tetanus shots and other required immunizations. You should have a booster for tetanus every 10 years. Be sure to get your flu shot every year, since 5%-20% of the [place] population comes down with the flu. The flu vaccine changes each year, so being vaccinated once is not enough. Get your shot in the fall, before the flu season peaks.   Other vaccines to consider:  Human Papilloma Virus or HPV causes cancer of the cervix,  and other infections that can be transmitted from person to person. There is a vaccine for HPV, and females should get immunized between the ages of 53 and 53. It requires a series of 3 shots.   Pneumococcal vaccine to protect against certain types of pneumonia.  This is normally recommended for adults age 20 or older.  However, adults younger than 73 years old with certain underlying conditions such as diabetes, heart or lung disease should also receive the vaccine.  Shingles vaccine to protect against Varicella Zoster if you are older than age 6, or younger than 73 years old with certain underlying illness.  Hepatitis A vaccine to protect against a form of infection of the liver by a virus acquired from food.  Hepatitis B vaccine to protect against a form of infection of the liver by a virus acquired from blood or body fluids, particularly if you work in health care.  If you plan to travel internationally, check with your local health department for specific vaccination recommendations.  Cancer Screening:  Breast cancer screening is essential to preventive care for women. All women age 65 and older should perform a breast self-exam every month. At age 70 and older, women should have their caregiver complete a breast exam each year. Women at ages 83 and older should have a mammogram (x-ray film) of the breasts. Your caregiver can discuss how often you need mammograms.    Cervical cancer screening includes taking a Pap smear (sample of cells examined under a microscope) from the cervix (end of the uterus). It also includes testing for HPV (Human Papilloma Virus, which can cause cervical cancer). Screening and a pelvic exam should begin at age 51, or 3 years after a woman becomes sexually active. Screening should occur every year, with a Pap smear but no HPV testing, up to age 45. After age 95, you should have a Pap smear every 3 years with HPV testing, if no HPV was found previously.   Most  routine colon cancer screening begins at the age of 54. On a yearly basis, doctors may provide special easy to use take-home tests to check for hidden blood in the stool. Sigmoidoscopy or colonoscopy can detect the earliest forms of colon cancer and is life saving. These tests use a small camera at the end of a tube to directly examine the colon. Speak to your caregiver about this at age 29, when routine screening begins (and is repeated every 5 years unless early forms of pre-cancerous polyps or small growths are found).

## 2013-10-03 NOTE — Progress Notes (Signed)
MEDICARE ANNUAL WELLNESS VISIT AND FOLLOW UP  Assessment:   1. Essential hypertension - CBC with Differential - BASIC METABOLIC PANEL WITH GFR - Hepatic function panel  2. Hypothyroidism, unspecified hypothyroidism type - TSH  3. Prediabetes Discussed general issues about diabetes pathophysiology and management., Educational material distributed., Suggested low cholesterol diet., Encouraged aerobic exercise., Discussed foot care., Reminded to get yearly retinal exam. - Hemoglobin A1c - Insulin, fasting  4. Encounter for long-term (current) use of other medications - Magnesium  5. Hyperlipidemia - Lipid panel  6. Vitamin D Deficiency - Vit D  25 hydroxy (rtn osteoporosis monitoring)  7. Acute cystitis without hematuria - Urinalysis, Routine w reflex microscopic - Urine culture  8. Estrogen deficiency - DG Bone Density; Future  Willing to get Prevnar at CPE, and checking shingles vaccine price   Plan:   During the course of the visit the patient was educated and counseled about appropriate screening and preventive services including:    Pneumococcal vaccine   Influenza vaccine  Td vaccine  Screening electrocardiogram  Screening mammography  Bone densitometry screening  Colorectal cancer screening  Diabetes screening  Glaucoma screening  Nutrition counseling   Advanced directives: given info/requested  Screening recommendations, referrals:  Vaccinations: Tdap vaccine not indicated Influenza vaccine continue to get yearly Pneumococcal vaccine not indicated Shingles vaccine check price Hep B vaccine not indicated  Nutrition assessed and recommended  Colonoscopy due 2019 Mammogram due 06/2014 Pap smear not indicated Pelvic exam not indicated Recommended yearly ophthalmology/optometry visit for glaucoma screening and checkup Recommended yearly dental visit for hygiene and checkup Advanced directives - requested  Conditions/risks  identified: BMI: Discussed weight loss, diet, and increase physical activity.  Increase physical activity: AHA recommends 150 minutes of physical activity a week.  Medications reviewed DEXA- ordered Prediabetes is at goal Urinary Incontinence is not an issue: discussed non pharmacology and pharmacology options.  Fall risk: low- discussed PT, home fall assessment, medications.    Subjective:   Rebecca Graves is a 73 y.o. female who presents for Medicare Annual Wellness Visit and 3 month follow up on hypertension, prediabetes, hyperlipidemia, vitamin D def.  Date of last medicare wellness visit is unknown.   Her blood pressure has been controlled at home, today their BP is BP: 102/60 mmHg She does workout. She denies chest pain, shortness of breath, dizziness.  She is not on cholesterol medication and denies myalgias. Her cholesterol is at goal. The cholesterol last visit was:   Lab Results  Component Value Date   CHOL 170 07/02/2013   HDL 75 07/02/2013   LDLCALC 58 07/02/2013   TRIG 184* 07/02/2013   CHOLHDL 2.3 07/02/2013   She has been working on diet and exercise for prediabetes, and denies paresthesia of the feet, polydipsia and polyuria. Last A1C in the office was:  Lab Results  Component Value Date   HGBA1C 5.9* 07/02/2013   Patient is on Vitamin D supplement. Lab Results  Component Value Date   VD25OH 30 07/02/2013     She is on thyroid medication. Her medication was not changed last visit. Patient denies nervousness, palpitations and weight changes.  Lab Results  Component Value Date   TSH 4.281 09/10/2013  .  She is on estrace and bASA, normal MGM in May.  She denies incontinence, very rarely first thing in the morning, but she does complain of difficulty with emptying her bladder. She does have a history of bladder surgery in 2004 with Dr. Rosana Hoes. Denies stress incontinence.  Names of Other Physician/Practitioners you currently use: 1. [facility]- here for primary care 2. Dr. Peter Garter, eye doctor, last visit a few weeks ago, visits every 3 months for glacoma 3. Can't remember name, dentist, last visit 2 years ago Patient Care Team: Unk Pinto, MD as PCP - General (Internal Medicine) Camillo Flaming, OD as Referring Physician (Optometry) Cindee Salt, MD as Consulting Physician (Physical Medicine and Rehabilitation) Ladene Artist, MD as Consulting Physician (Gastroenterology)  Medication Review Current Outpatient Prescriptions on File Prior to Visit  Medication Sig Dispense Refill  . ALPRAZolam (XANAX) 1 MG tablet Take 1 tablet (1 mg total) by mouth at bedtime as needed for sleep.  90 tablet  1  . B Complex-C (SUPER B COMPLEX PO) Take 1 tablet by mouth daily.      . bimatoprost (LUMIGAN) 0.03 % ophthalmic solution 1 drop at bedtime.      . bisoprolol-hydrochlorothiazide (ZIAC) 10-6.25 MG per tablet TAKE 1 TABLET EVERY DAY FOR BLOOD PRESSURE  90 tablet  1  . Cholecalciferol (VITAMIN D-3) 5000 UNITS TABS Take 1 tablet by mouth daily.      Marland Kitchen estradiol (ESTRACE) 0.5 MG tablet Take 1/2 tablet MWF, 1 tablet rest of week, need to wean down  90 tablet  0  . furosemide (LASIX) 40 MG tablet TAKE 1 TABLET EVERY DAY  FOR  BLOOD  PRESSURE/FLUID  90 tablet  1  . levothyroxine (SYNTHROID, LEVOTHROID) 125 MCG tablet TAKE 1 TABLET EVERY DAY  OR AS DIRECTED  90 tablet  3  . Magnesium 500 MG CAPS Take 3 capsules by mouth daily.      . meloxicam (MOBIC) 15 MG tablet TAKE 1 TABLET EVERY DAY AS NEEDED  FOR  PAIN  AND  INFLAMMATION  90 tablet  1  . omeprazole (PRILOSEC) 40 MG capsule TAKE 1 CAPSULE EVERY DAY  FOR  ACID  REFLUX  90 capsule  3  . Red Yeast Rice 600 MG CAPS Take 1 capsule by mouth daily.      Marland Kitchen rOPINIRole (REQUIP) 1 MG tablet Take 1 mg by mouth 3 (three) times daily. prn      . timolol (TIMOPTIC) 0.5 % ophthalmic solution 1 drop 2 (two) times daily.      . traMADol (ULTRAM) 50 MG tablet Take 50 mg by mouth every 6 (six) hours as  needed for pain.       No current facility-administered medications on file prior to visit.    Current Problems (verified) Patient Active Problem List   Diagnosis Date Noted  . Vitamin D Deficiency 03/31/2013  . Encounter for long-term (current) use of other medications 03/31/2013  . Arthritis   . Glaucoma   . Hypothyroidism   . Hypertension   . Hyperlipidemia   . GERD (gastroesophageal reflux disease)   . Prediabetes   . Tear of lateral meniscus of knee, current 03/27/2011    Screening Tests Health Maintenance  Topic Date Due  . Zostavax  12/09/2000  . Influenza Vaccine  09/28/2013  . Mammogram  07/10/2015  . Tetanus/tdap  04/25/2017  . Colonoscopy  11/29/2017  . Pneumococcal Polysaccharide Vaccine Age 48 And Over  Completed     Immunization History  Administered Date(s) Administered  . Pneumococcal Polysaccharide-23 05/29/2008  . Td 04/26/2007    Preventative care: Last colonoscopy: Dr. Fuller Plan on 11/2012 due 2019 Last mammogram: 06/2013 Last pap smear/pelvic exam: remote DEXA: never  Prior vaccinations: TD or Tdap: 2009  Influenza:  2014 Pneumococcal: 2010 Shingles/Zostavax: check price  History reviewed: allergies, current medications, past family history, past medical history, past social history, past surgical history and problem list  Risk Factors: Osteoporosis: postmenopausal estrogen deficiency and dietary calcium and/or vitamin D deficiency History of fracture in the past year: no  Tobacco History  Substance Use Topics  . Smoking status: Former Smoker    Quit date: 03/22/1996  . Smokeless tobacco: Never Used  . Alcohol Use: 1.0 oz/week    2 drink(s) per week     Comment: occ   She does not smoke.  Patient is a former smoker. Are there smokers in your home (other than you)?  No  Alcohol Current alcohol use: social drinker  Caffeine Current caffeine use: coffee 2 /day  Exercise Current exercise: swimming and  walking  Nutrition/Diet Current diet: in general, a "healthy" diet    Cardiac risk factors: advanced age (older than 30 for men, 40 for women), dyslipidemia, hypertension and obesity (BMI >= 30 kg/m2).  Depression Screen (Note: if answer to either of the following is "Yes", a more complete depression screening is indicated)   Q1: Over the past two weeks, have you felt down, depressed or hopeless? No  Q2: Over the past two weeks, have you felt little interest or pleasure in doing things? No  Have you lost interest or pleasure in daily life? No  Do you often feel hopeless? No  Do you cry easily over simple problems? No  Activities of Daily Living In your present state of health, do you have any difficulty performing the following activities?:  Driving? No Managing money?  No Feeding yourself? No Getting from bed to chair? No Climbing a flight of stairs? No Preparing food and eating?: No Bathing or showering? No Getting dressed: No Getting to the toilet? No Using the toilet:No Moving around from place to place: No In the past year have you fallen or had a near fall?:No   Are you sexually active?  No  Do you have more than one partner?  No  Vision Difficulties: Yes  Hearing Difficulties: No Do you often ask people to speak up or repeat themselves? No Do you experience ringing or noises in your ears? No Do you have difficulty understanding soft or whispered voices? No  Cognition  Do you feel that you have a problem with memory?No  Do you often misplace items? No  Do you feel safe at home?  Yes  Advanced directives Does patient have a [place]? Yes Does patient have a Living Will? Yes   Objective:   Blood pressure 102/60, pulse 60, temperature 97.7 F (36.5 C), resp. rate 16, weight 222 lb (100.699 kg). Body mass index is 33.76 kg/(m^2).  General appearance: alert, no distress, WD/WN,  female Cognitive Testing  Alert? Yes  Normal  Appearance?Yes  Oriented to person? Yes  Place? Yes   Time? Yes  Recall of three objects?  Yes  Can perform simple calculations? Yes  Displays appropriate judgment?Yes  Can read the correct time from a watch face?Yes  HEENT: normocephalic, sclerae anicteric, TMs pearly, nares patent, no discharge or erythema, pharynx normal Oral cavity: MMM, no lesions Neck: supple, no lymphadenopathy, no thyromegaly, no masses Heart: RRR, normal S1, S2, no murmurs Lungs: CTA bilaterally, no wheezes, rhonchi, or rales Abdomen: +bs, soft, non tender, non distended, no masses, no hepatomegaly, no splenomegaly Musculoskeletal: nontender, no swelling, no obvious deformity Extremities: no edema, no cyanosis, no clubbing Pulses: 2+ symmetric,  upper and lower extremities, normal cap refill Neurological: alert, oriented x 3, CN2-12 intact, strength normal upper extremities and lower extremities, sensation normal throughout, DTRs 2+ throughout, no cerebellar signs, gait normal Psychiatric: normal affect, behavior normal, pleasant  Breast: defer Gyn: defer Rectal: defer  Medicare Attestation I have personally reviewed: The patient's medical and social history Their use of alcohol, tobacco or illicit drugs Their current medications and supplements The patient's functional ability including ADLs,fall risks, home safety risks, cognitive, and hearing and visual impairment Diet and physical activities Evidence for depression or mood disorders  The patient's weight, height, BMI, and visual acuity have been recorded in the chart.  I have made referrals, counseling, and provided education to the patient based on review of the above and I have provided the patient with a written personalized care plan for preventive services.     Vicie Mutters, PA-C   10/03/2013

## 2013-10-04 LAB — URINALYSIS, ROUTINE W REFLEX MICROSCOPIC
BILIRUBIN URINE: NEGATIVE
Glucose, UA: NEGATIVE mg/dL
Hgb urine dipstick: NEGATIVE
Ketones, ur: NEGATIVE mg/dL
Leukocytes, UA: NEGATIVE
Nitrite: NEGATIVE
Protein, ur: NEGATIVE mg/dL
Specific Gravity, Urine: 1.005 (ref 1.005–1.030)
Urobilinogen, UA: 0.2 mg/dL (ref 0.0–1.0)
pH: 5 (ref 5.0–8.0)

## 2013-10-04 LAB — HEPATIC FUNCTION PANEL
ALK PHOS: 52 U/L (ref 39–117)
ALT: 16 U/L (ref 0–35)
AST: 16 U/L (ref 0–37)
Albumin: 4.5 g/dL (ref 3.5–5.2)
BILIRUBIN INDIRECT: 0.5 mg/dL (ref 0.2–1.2)
Bilirubin, Direct: 0.1 mg/dL (ref 0.0–0.3)
TOTAL PROTEIN: 7.1 g/dL (ref 6.0–8.3)
Total Bilirubin: 0.6 mg/dL (ref 0.2–1.2)

## 2013-10-04 LAB — VITAMIN D 25 HYDROXY (VIT D DEFICIENCY, FRACTURES): Vit D, 25-Hydroxy: 94 ng/mL — ABNORMAL HIGH (ref 30–89)

## 2013-10-04 LAB — URINE CULTURE
COLONY COUNT: NO GROWTH
ORGANISM ID, BACTERIA: NO GROWTH

## 2013-10-04 LAB — LIPID PANEL
CHOL/HDL RATIO: 2.6 ratio
Cholesterol: 190 mg/dL (ref 0–200)
HDL: 73 mg/dL (ref >39)
LDL Cholesterol: 81 mg/dL (ref 0–99)
Triglycerides: 179 mg/dL — ABNORMAL HIGH (ref <150)
VLDL: 36 mg/dL (ref 0–40)

## 2013-10-04 LAB — MAGNESIUM: Magnesium: 1.9 mg/dL (ref 1.5–2.5)

## 2013-10-04 LAB — BASIC METABOLIC PANEL WITH GFR
BUN: 18 mg/dL (ref 6–23)
CHLORIDE: 102 meq/L (ref 96–112)
CO2: 28 meq/L (ref 19–32)
CREATININE: 1.11 mg/dL — AB (ref 0.50–1.10)
Calcium: 10.6 mg/dL — ABNORMAL HIGH (ref 8.4–10.5)
GFR, EST NON AFRICAN AMERICAN: 50 mL/min — AB
GFR, Est African American: 57 mL/min — ABNORMAL LOW
Glucose, Bld: 111 mg/dL — ABNORMAL HIGH (ref 70–99)
POTASSIUM: 4.3 meq/L (ref 3.5–5.3)
Sodium: 141 mEq/L (ref 135–145)

## 2013-10-04 LAB — INSULIN, FASTING: INSULIN FASTING, SERUM: 25 u[IU]/mL (ref 3–28)

## 2013-10-04 LAB — TSH: TSH: 5.56 u[IU]/mL — AB (ref 0.350–4.500)

## 2013-11-05 ENCOUNTER — Ambulatory Visit (INDEPENDENT_AMBULATORY_CARE_PROVIDER_SITE_OTHER): Payer: Commercial Managed Care - HMO | Admitting: *Deleted

## 2013-11-05 DIAGNOSIS — E039 Hypothyroidism, unspecified: Secondary | ICD-10-CM

## 2013-11-05 NOTE — Progress Notes (Signed)
Patient ID: Rebecca Graves, female   DOB: 12/31/1940, 73 y.o.   MRN: [id number] Patient presents for recheck TSH level.  Patient currently taking Levothyroxine Rx 125 mcg 1/2 pill M,W,F and 1 pill times 4 days. Taking AD on an empty stomach first thing in the morning without any other vitamins or Rx's.  Waits an hour before taking any other Rx's, vitamins or eating.

## 2013-11-06 LAB — TSH: TSH: 3.774 u[IU]/mL (ref 0.350–4.500)

## 2013-11-26 ENCOUNTER — Other Ambulatory Visit: Payer: Self-pay | Admitting: Internal Medicine

## 2013-11-27 ENCOUNTER — Other Ambulatory Visit: Payer: Self-pay | Admitting: Internal Medicine

## 2013-11-27 MED ORDER — MELOXICAM 15 MG PO TABS: ORAL_TABLET | ORAL | Status: AC

## 2013-12-05 ENCOUNTER — Other Ambulatory Visit: Payer: Self-pay | Admitting: Internal Medicine

## 2013-12-05 MED ORDER — HYDROCORTISONE 2.5 % RE CREA: TOPICAL_CREAM | RECTAL | Status: DC

## 2013-12-16 ENCOUNTER — Telehealth: Payer: Self-pay | Admitting: *Deleted

## 2013-12-16 MED ORDER — HYDROCORTISONE ACETATE 25 MG RE SUPP: 25.0000 mg | Freq: Four times a day (QID) | RECTAL | Status: DC | PRN

## 2013-12-16 NOTE — Telephone Encounter (Signed)
Patient called requesting refill on Annusol supp. Sent into [place].  Rx refilled per Dr. Idell Pickles orders.

## 2013-12-18 ENCOUNTER — Other Ambulatory Visit: Payer: Self-pay | Admitting: *Deleted

## 2013-12-18 MED ORDER — HYDROCORTISONE ACETATE 25 MG RE SUPP: 25.0000 mg | Freq: Two times a day (BID) | RECTAL | Status: DC

## 2013-12-19 ENCOUNTER — Ambulatory Visit (INDEPENDENT_AMBULATORY_CARE_PROVIDER_SITE_OTHER): Payer: Commercial Managed Care - HMO | Admitting: Internal Medicine

## 2013-12-19 ENCOUNTER — Encounter: Payer: Self-pay | Admitting: Internal Medicine

## 2013-12-19 VITALS — BP 104/60 | HR 64 | Temp 97.0°F | Resp 16 | Ht 68.0 in | Wt 225.8 lb

## 2013-12-19 DIAGNOSIS — K648 Other hemorrhoids: Secondary | ICD-10-CM

## 2013-12-19 MED ORDER — PREDNISONE 20 MG PO TABS: ORAL_TABLET | ORAL | Status: DC

## 2013-12-19 MED ORDER — HYDROCODONE-ACETAMINOPHEN 5-325 MG PO TABS
ORAL_TABLET | ORAL | Status: DC
Start: 2013-12-19 — End: 2014-01-06

## 2013-12-19 NOTE — Patient Instructions (Signed)

## 2013-12-19 NOTE — Progress Notes (Signed)
   Subjective:    Patient ID: Rebecca Graves, female    DOB: 1940/08/30, 73 y.o.   MRN: [id number]  HPI  Patient presents w/ c/o's a very painful prolapsing int hemorrhoid. Patient had colonoscopy 11/29/2013 by Dr Fuller Plan.  Meds - All - PMH - PSH - reviewed unchanged.  Review of Systems Neg except as above  Objective:   Physical Exam  BP 104/60  Pulse 64  Temp(Src) 97 F (36.1 C) (Temporal)  Resp 16  Ht 5\' 8"  (1.727 m)  Wt 225 lb 12.8 oz (102.422 kg)  BMI 34.34 kg/m2  Focused exam finds few ext. Hemm. Tags, but no ext hemorroids, tears, fissures or fistulae. DRE is painful and finds a couple of large tender int hemorroids. No obvious bleeding.  Assessment & Plan:   1. Inflamed internal hemorrhoid  - Ambulatory referral to Gastroenterology  Rx Anucort - HC & Norco-prn.

## 2013-12-23 ENCOUNTER — Encounter: Payer: Self-pay | Admitting: Gastroenterology

## 2013-12-23 ENCOUNTER — Telehealth: Payer: Self-pay | Admitting: Gastroenterology

## 2013-12-23 ENCOUNTER — Ambulatory Visit (INDEPENDENT_AMBULATORY_CARE_PROVIDER_SITE_OTHER): Payer: Commercial Managed Care - HMO | Admitting: Gastroenterology

## 2013-12-23 VITALS — BP 140/80 | HR 65 | Ht 68.0 in | Wt 225.0 lb

## 2013-12-23 DIAGNOSIS — K6289 Other specified diseases of anus and rectum: Secondary | ICD-10-CM

## 2013-12-23 DIAGNOSIS — K602 Anal fissure, unspecified: Secondary | ICD-10-CM

## 2013-12-23 MED ORDER — DILTIAZEM GEL 2 %
1.0000 | Freq: Three times a day (TID) | CUTANEOUS | Status: DC
Start: 2013-12-23 — End: 2014-02-14

## 2013-12-23 MED ORDER — DILTIAZEM GEL 2 %: 1.0000 "application " | Freq: Three times a day (TID) | CUTANEOUS | Status: DC

## 2013-12-23 NOTE — Progress Notes (Signed)
    History of Present Illness: This is a 73 year old female who relates one-month history of severe anal/rectal pain. She notes anal and rectal pain with bowel movements and for several hours afterwards. She was evaluated by her PCP and hemorrhoids were noted which were also noted on her colonoscopy in 2014. She was treated with hydrocortisone suppositories, hydrocortisone creams  and Norco and has noted improvement in symptoms.  Review of Systems: Pertinent positive and negative review of systems were noted in the above HPI section. All other review of systems were otherwise negative.  Current Medications, Allergies, Past Medical History, Past Surgical History, Family History and Social History were reviewed in [employer] record.  Physical Exam: General: Well developed , well nourished, no acute distress Head: Normocephalic and atraumatic Eyes:  sclerae anicteric, EOMI Ears: Normal auditory acuity Mouth: No deformity or lesions Neck: Supple, no masses or thyromegaly Lungs: Clear throughout to auscultation Heart: Regular rate and rhythm; no murmurs, rubs or bruits Abdomen: Soft, non tender and non distended. No masses, hepatosplenomegaly or hernias noted. Normal Bowel sounds Rectal: Anal fissure with marked anal tenderness on a limited DRE, heme neg stool Musculoskeletal: Symmetrical with no gross deformities  Skin: No lesions on visible extremities Pulses:  Normal pulses noted Extremities: No clubbing, cyanosis, edema or deformities noted Neurological: Alert oriented x 4, grossly nonfocal Cervical Nodes:  No significant cervical adenopathy Inguinal Nodes: No significant inguinal adenopathy Psychological:  Alert and cooperative. Normal mood and affect  Assessment and Recommendations:  1. Anal/rectal pain. Anal fissure. Internal hemorrhoids.  Begin standard treatment for anal fissure with diltiazem 2% cream 3 times a day and lidocaine topically 3 times a day as  needed. Continue to keep stools soft with magnesium. REV in 6 weeks.

## 2013-12-23 NOTE — Patient Instructions (Addendum)
You have a follow up visit scheduled with Dr. Fuller Plan for 02-03-2014 at 9:15 am If you need to cancel or reschedule please call [phone]  Diltiazem 2 % gel was sent to [facility] on [address]. Apply three times daily for eight weeks  Recticare can be purchased over the counter, this can be applied three times daily as needed for pain

## 2013-12-23 NOTE — Telephone Encounter (Signed)
POWER WAS OUT AT THE PHARMACY, THEY CALLED REQUESTED RX TO BE RESENT  RESENDING

## 2013-12-26 ENCOUNTER — Other Ambulatory Visit: Payer: Self-pay | Admitting: *Deleted

## 2013-12-26 MED ORDER — BISOPROLOL-HYDROCHLOROTHIAZIDE 10-6.25 MG PO TABS: ORAL_TABLET | ORAL | Status: DC

## 2014-01-06 ENCOUNTER — Encounter: Payer: Self-pay | Admitting: Internal Medicine

## 2014-01-06 ENCOUNTER — Ambulatory Visit (INDEPENDENT_AMBULATORY_CARE_PROVIDER_SITE_OTHER): Payer: Commercial Managed Care - HMO | Admitting: Internal Medicine

## 2014-01-06 VITALS — BP 114/60 | HR 60 | Temp 97.7°F | Resp 16 | Ht 68.0 in | Wt 222.2 lb

## 2014-01-06 DIAGNOSIS — R7309 Other abnormal glucose: Secondary | ICD-10-CM

## 2014-01-06 DIAGNOSIS — I10 Essential (primary) hypertension: Secondary | ICD-10-CM

## 2014-01-06 DIAGNOSIS — E785 Hyperlipidemia, unspecified: Secondary | ICD-10-CM

## 2014-01-06 DIAGNOSIS — R7303 Prediabetes: Secondary | ICD-10-CM

## 2014-01-06 DIAGNOSIS — E559 Vitamin D deficiency, unspecified: Secondary | ICD-10-CM

## 2014-01-06 DIAGNOSIS — Z79899 Other long term (current) drug therapy: Secondary | ICD-10-CM

## 2014-01-06 NOTE — Progress Notes (Deleted)
   Subjective:    Patient ID: Rebecca Graves, female    DOB: 03/23/1940, 73 y.o.   MRN: [id number]  HPI    Review of Systems     Objective:   Physical Exam        Assessment & Plan:

## 2014-01-06 NOTE — Patient Instructions (Signed)

## 2014-01-06 NOTE — Progress Notes (Signed)
Patient ID: Rebecca Graves, female   DOB: 07/26/1940, 73 y.o.   MRN: [id number]   This very nice 73 y.o.DBF presents for 3 month follow up with Hypertension, Hyperlipidemia, Hypothyroidism, Morbid Obesity, Pre-Diabetes and Vitamin D Deficiency. Recently patient has been seen by Dr Fuller Plan for painful prolapsing hemorrhoids.    Patient is treated for HTN (1997) & BP has been controlled at home. Today's BP: 114/60 mmHg. Patient has had no complaints of any cardiac type chest pain, palpitations, dyspnea/orthopnea/PND, dizziness, claudication, or dependent edema.   Hyperlipidemia is controlled with diet & meds. Patient denies myalgias or other med SE's. Last Lipids were at goal - Total  Chol 190; HDL 73; LDL 81; Trig 179 on  10/03/2013.   Also, the patient has history of Morbid Obesity (BMI 35) and consequent PreDiabetes (A1c 5.9% in 2003) and has had no symptoms of reactive hypoglycemia, diabetic polys, paresthesias or visual blurring.  Last A1c was  6.3% on 10/03/2013.   Patient's thyroid functions have been compensated in the normal range.Further, the patient also has history of Vitamin D Deficiency ( Vit 28 in 2008) and supplements vitamin D without any suspected side-effects. Last vitamin D was  94 on 10/03/2013.   Medication List   bisoprolol-hydrochlorothiazide 10-6.25 MG per tablet  Commonly known as:  ZIAC  TAKE 1 TABLET EVERY DAY FOR BLOOD PRESSURE     diltiazem 2 % Gel  Apply 1 application topically 3 (three) times daily.     estradiol 0.5 MG tablet  Commonly known as:  ESTRACE  take 1 tab daily     FLUZONE HIGH-DOSE 0.5 ML Susy  Generic drug:  Influenza Vac Split High-Dose     furosemide 40 MG tablet  Commonly known as:  LASIX  TAKE 1 TABLET EVERY DAY  FOR  BLOOD  PRESSURE/FLUID     HYDROcodone-acetaminophen 5-325 MG per tablet  Commonly known as:  NORCO  Take 1/2 to 1 tablet every 3 to 4 hours as needed for pain     hydrocortisone 2.5 % rectal cream  Commonly known as:  ANUSOL-HC   Apply rectally 4 times daily as needed     hydrocortisone 25 MG suppository  Commonly known as:  ANUSOL-HC  Place 1 suppository (25 mg total) rectally 2 (two) times daily.     levothyroxine 125 MCG tablet  Commonly known as:  SYNTHROID, LEVOTHROID  TAKE 1 TABLET EVERY DAY  OR AS DIRECTED     LUMIGAN 0.01 % Soln  Generic drug:  bimatoprost     Magnesium 500 MG Caps  Take 3 capsules by mouth daily.     meloxicam 15 MG tablet  Commonly known as:  MOBIC  Take 1/2 to 1 tablet daily with food as needed for pain & inflammation     omeprazole 40 MG capsule  Commonly known as:  PRILOSEC  TAKE 1 CAPSULE EVERY DAY  FOR  ACID  REFLUX     predniSONE 20 MG tablet  Commonly known as:  DELTASONE  1 tab 3 x day for 2 days, then 1 tab 2 x day for 2 days, then 1 tab 1 x day for 3 days     Red Yeast Rice 600 MG Caps  Take 1 capsule by mouth daily.     rOPINIRole 1 MG tablet  Commonly known as:  REQUIP  Take 1 mg by mouth 3 (three) times daily. prn     SUPER B COMPLEX PO  Take 1 tablet by mouth daily.  timolol 0.5 % ophthalmic solution  Commonly known as:  TIMOPTIC  1 drop 2 (two) times daily.     traMADol 50 MG tablet  Commonly known as:  ULTRAM  Take 50 mg by mouth every 6 (six) hours as needed for pain.     Vitamin D-3 5000 UNITS Tabs  Take 1 tablet by mouth daily.     No Known Allergies  PMHx:   Past Medical History  Diagnosis Date  . Arthritis   . Glaucoma   . Hypothyroidism   . Hypertension   . Hyperlipidemia   . GERD (gastroesophageal reflux disease)   . Prediabetes    Immunization History  Administered Date(s) Administered  . Influenza-Unspecified 12/09/2013  . Pneumococcal Polysaccharide-23 05/29/2008  . Td 04/26/2007   Past Surgical History  Procedure Laterality Date  . Abdominal hysterectomy    . Appendectomy    . Joint replacement  2007    rt hip replacement  . Bladder suspension    . Knee arthroscopy  03/28/2011    Procedure: ARTHROSCOPY KNEE;   Surgeon: Kerin Salen, MD;  Location: [facility];  Service: Orthopedics;  Laterality: Right;  with partial lateral meniscectomy, Debridement of Chondromalacia   FHx:    Reviewed / unchanged  SHx:    Reviewed / unchanged  Systems Review:  Constitutional: Denies fever, chills, wt changes, headaches, insomnia, fatigue, night sweats, change in appetite. Eyes: Denies redness, blurred vision, diplopia, discharge, itchy, watery eyes.  ENT: Denies discharge, congestion, post nasal drip, epistaxis, sore throat, earache, hearing loss, dental pain, tinnitus, vertigo, sinus pain, snoring.  CV: Denies chest pain, palpitations, irregular heartbeat, syncope, dyspnea, diaphoresis, orthopnea, PND, claudication or edema. Respiratory: denies cough, dyspnea, DOE, pleurisy, hoarseness, laryngitis, wheezing.  Gastrointestinal: Denies dysphagia, odynophagia, heartburn, reflux, water brash, abdominal pain or cramps, nausea, vomiting, bloating, diarrhea, constipation, hematemesis, melena, hematochezia  or hemorrhoids. Genitourinary: Denies dysuria, frequency, urgency, nocturia, hesitancy, discharge, hematuria or flank pain. Musculoskeletal: Denies arthralgias, myalgias, stiffness, jt. swelling, pain, limping or strain/sprain.  Skin: Denies pruritus, rash, hives, warts, acne, eczema or change in skin lesion(s). Neuro: No weakness, tremor, incoordination, spasms, paresthesia or pain. Psychiatric: Denies confusion, memory loss or sensory loss. Endo: Denies change in weight, skin or hair change.  Heme/Lymph: No excessive bleeding, bruising or enlarged lymph nodes.  Exam:  BP 114/60 mmHg  Pulse 60  Temp  97.7 F   Resp 16  Ht 5\' 8"    Wt 222 lb 3.2 oz   BMI 33.79   Appears well nourished and in no distress. Eyes: PERRLA, EOMs, conjunctiva no swelling or erythema. Sinuses: No frontal/maxillary tenderness ENT/Mouth: EAC's clear, TM's nl w/o erythema, bulging. Nares clear w/o erythema, swelling,  exudates. Oropharynx clear without erythema or exudates. Oral hygiene is good. Tongue normal, non obstructing. Hearing intact.  Neck: Supple. Thyroid nl. Car 2+/2+ without bruits, nodes or JVD. Chest: Respirations nl with BS clear & equal w/o rales, rhonchi, wheezing or stridor.  Cor: Heart sounds normal w/ regular rate and rhythm without sig. murmurs, gallops, clicks, or rubs. Peripheral pulses normal and equal  without edema.  Abdomen: Soft & bowel sounds normal. Non-tender w/o guarding, rebound, hernias, masses, or organomegaly.  Lymphatics: Unremarkable.  Musculoskeletal: Full ROM all peripheral extremities, joint stability, 5/5 strength, and normal gait.  Skin: Warm, dry without exposed rashes, lesions or ecchymosis apparent.  Neuro: Cranial nerves intact, reflexes equal bilaterally. Sensory-motor testing grossly intact. Tendon reflexes grossly intact.  Pysch: Alert & oriented x 3.  Insight  and judgement nl & appropriate. No ideations.  Assessment and Plan:  1. Hypertension - Continue monitor blood pressure at home. Continue diet/meds same.  2. Hyperlipidemia - Continue diet/meds, exercise,& lifestyle modifications. Continue monitor periodic cholesterol/liver & renal functions   3. Pre-Diabetes - Continue diet, exercise, lifestyle modifications. Monitor appropriate labs.  4. Vitamin D Deficiency - Continue supplementation.  5. Hypothyroidism - Continue monitoring    Recommended regular exercise, BP monitoring, weight control, and discussed med and SE's. Recommended labs to assess and monitor clinical status. Further disposition pending results of labs.

## 2014-01-07 LAB — CBC WITH DIFFERENTIAL/PLATELET
BASOS ABS: 0 10*3/uL (ref 0.0–0.1)
BASOS PCT: 1 % (ref 0–1)
EOS PCT: 3 % (ref 0–5)
Eosinophils Absolute: 0.1 10*3/uL (ref 0.0–0.7)
HCT: 42.8 % (ref 36.0–46.0)
Hemoglobin: 14.7 g/dL (ref 12.0–15.0)
Lymphocytes Relative: 36 % (ref 12–46)
Lymphs Abs: 1.6 10*3/uL (ref 0.7–4.0)
MCH: 28.1 pg (ref 26.0–34.0)
MCHC: 34.3 g/dL (ref 30.0–36.0)
MCV: 81.7 fL (ref 78.0–100.0)
MONO ABS: 0.4 10*3/uL (ref 0.1–1.0)
Monocytes Relative: 8 % (ref 3–12)
Neutro Abs: 2.3 10*3/uL (ref 1.7–7.7)
Neutrophils Relative %: 52 % (ref 43–77)
Platelets: 248 10*3/uL (ref 150–400)
RBC: 5.24 MIL/uL — ABNORMAL HIGH (ref 3.87–5.11)
RDW: 14.4 % (ref 11.5–15.5)
WBC: 4.5 10*3/uL (ref 4.0–10.5)

## 2014-01-07 LAB — HEPATIC FUNCTION PANEL
ALBUMIN: 4.6 g/dL (ref 3.5–5.2)
ALK PHOS: 54 U/L (ref 39–117)
ALT: 21 U/L (ref 0–35)
AST: 18 U/L (ref 0–37)
Bilirubin, Direct: 0.1 mg/dL (ref 0.0–0.3)
Indirect Bilirubin: 0.6 mg/dL (ref 0.2–1.2)
TOTAL PROTEIN: 7.4 g/dL (ref 6.0–8.3)
Total Bilirubin: 0.7 mg/dL (ref 0.2–1.2)

## 2014-01-07 LAB — BASIC METABOLIC PANEL WITH GFR
BUN: 12 mg/dL (ref 6–23)
CHLORIDE: 101 meq/L (ref 96–112)
CO2: 26 mEq/L (ref 19–32)
Calcium: 10.5 mg/dL (ref 8.4–10.5)
Creat: 1.2 mg/dL — ABNORMAL HIGH (ref 0.50–1.10)
GFR, EST AFRICAN AMERICAN: 52 mL/min — AB
GFR, EST NON AFRICAN AMERICAN: 45 mL/min — AB
Glucose, Bld: 125 mg/dL — ABNORMAL HIGH (ref 70–99)
Potassium: 4.2 mEq/L (ref 3.5–5.3)
Sodium: 144 mEq/L (ref 135–145)

## 2014-01-07 LAB — VITAMIN D 25 HYDROXY (VIT D DEFICIENCY, FRACTURES): Vit D, 25-Hydroxy: 90 ng/mL — ABNORMAL HIGH (ref 30–89)

## 2014-01-07 LAB — INSULIN, FASTING: INSULIN FASTING, SERUM: 53.1 u[IU]/mL — AB (ref 2.0–19.6)

## 2014-01-07 LAB — LIPID PANEL
Cholesterol: 215 mg/dL — ABNORMAL HIGH (ref 0–200)
HDL: 91 mg/dL (ref >39)
LDL CALC: 77 mg/dL (ref 0–99)
TRIGLYCERIDES: 234 mg/dL — AB (ref <150)
Total CHOL/HDL Ratio: 2.4 Ratio
VLDL: 47 mg/dL — ABNORMAL HIGH (ref 0–40)

## 2014-01-07 LAB — HEMOGLOBIN A1C
Hgb A1c MFr Bld: 6.2 % — ABNORMAL HIGH (ref <5.7)
MEAN PLASMA GLUCOSE: 131 mg/dL — AB (ref <117)

## 2014-01-07 LAB — MAGNESIUM: Magnesium: 1.8 mg/dL (ref 1.5–2.5)

## 2014-01-07 LAB — TSH: TSH: 4.485 u[IU]/mL (ref 0.350–4.500)

## 2014-01-21 ENCOUNTER — Telehealth: Payer: Self-pay | Admitting: Gastroenterology

## 2014-01-21 NOTE — Telephone Encounter (Signed)
Patient feels diltiazem is not helping.  "I thought I would be healed by now".  I discussed with her that it can take several months for a rectal fissure to heal.  She is reminded that Dr. Fuller Plan wanted her to use the diltiazem gel TID for 8 weeks, her office visit was 12/23/13.  She is reminded to also use the lidocaine gel as needed.  She has a follow up mid December that she is asked to keep.  She will refill her diltiazem gel and continue another month of diltiazem.

## 2014-02-03 ENCOUNTER — Encounter: Payer: Self-pay | Admitting: Gastroenterology

## 2014-02-03 ENCOUNTER — Ambulatory Visit (INDEPENDENT_AMBULATORY_CARE_PROVIDER_SITE_OTHER): Payer: Commercial Managed Care - HMO | Admitting: Gastroenterology

## 2014-02-03 VITALS — BP 120/68 | HR 60 | Ht 68.0 in | Wt 224.1 lb

## 2014-02-03 DIAGNOSIS — K602 Anal fissure, unspecified: Secondary | ICD-10-CM

## 2014-02-03 NOTE — Progress Notes (Signed)
    History of Present Illness: This is a 73 year old female who returns for follow-up of an anal fissure on diltiazem cream. Her symptoms have substantially improved but she still experiences pain with defecation.  Current Medications, Allergies, Past Medical History, Past Surgical History, Family History and Social History were reviewed in [employer] record.  Physical Exam: General: Well developed , well nourished, no acute distress Head: Normocephalic and atraumatic Eyes:  sclerae anicteric, EOMI Ears: Normal auditory acuity Mouth: No deformity or lesions Lungs: Clear throughout to auscultation Heart: Regular rate and rhythm; no murmurs, rubs or bruits Abdomen: Soft, non tender and non distended. No masses, hepatosplenomegaly or hernias noted. Normal Bowel sounds Rectal: not repeated Musculoskeletal: Symmetrical with no gross deformities  noted Extremities: No clubbing, cyanosis, edema or deformities noted Neurological: Alert oriented x 4, grossly nonfocal Psychological:  Alert and cooperative. Normal mood and affect  Assessment and Recommendations:  1. Anal fissure. Symptoms improved but have not resolved. Continue Diltiazem cream tid and keep stools soft. Follow in 1 month. If symptoms do not resolve proceed with surgical referral.

## 2014-02-03 NOTE — Patient Instructions (Signed)
Continue using your Diltiazem gel.    Please follow up with Dr. Fuller Plan on 03/10/2014 at 9:15am

## 2014-02-14 ENCOUNTER — Other Ambulatory Visit: Payer: Self-pay

## 2014-02-14 MED ORDER — DILTIAZEM GEL 2 %: 1.0000 | Freq: Three times a day (TID) | CUTANEOUS | Status: DC

## 2014-03-10 ENCOUNTER — Ambulatory Visit (INDEPENDENT_AMBULATORY_CARE_PROVIDER_SITE_OTHER): Payer: Commercial Managed Care - HMO | Admitting: Gastroenterology

## 2014-03-10 ENCOUNTER — Encounter: Payer: Self-pay | Admitting: Gastroenterology

## 2014-03-10 VITALS — BP 130/64 | HR 64 | Ht 68.0 in | Wt 225.2 lb

## 2014-03-10 DIAGNOSIS — K6289 Other specified diseases of anus and rectum: Secondary | ICD-10-CM | POA: Diagnosis not present

## 2014-03-10 DIAGNOSIS — K602 Anal fissure, unspecified: Secondary | ICD-10-CM

## 2014-03-10 MED ORDER — DILTIAZEM GEL 2 %: 1.0000 "application " | Freq: Three times a day (TID) | CUTANEOUS | Status: DC

## 2014-03-10 NOTE — Patient Instructions (Addendum)
We will contact your Primary Care office to call and schedule your referral to [facility].   We have sent the following medications to your pharmacy for you to pick up at your convenience:Diltiazem gel.  Thank you for choosing me and [facility].  Pricilla Riffle. Dagoberto Ligas., MD., Marval Regal  cc: Unk Pinto, MD

## 2014-03-10 NOTE — Progress Notes (Signed)
History of Present Illness: This is a 74 year old female returning for follow-up of his rectal pain secondary to an anal fissure. She has used diltiazem cream for over 2 months. Her symptoms have improved although they have not resolved as she has pain with almost every bowel movement. Colonoscopy in October 2014 showed small internal hemorrhoids  No Known Allergies Outpatient Prescriptions Prior to Visit  Medication Sig Dispense Refill  . ALPRAZolam (XANAX) 1 MG tablet Take 1 tablet (1 mg total) by mouth at bedtime as needed for sleep. 90 tablet 1  . B Complex-C (SUPER B COMPLEX PO) Take 1 tablet by mouth daily.    . bisoprolol-hydrochlorothiazide (ZIAC) 10-6.25 MG per tablet TAKE 1 TABLET EVERY DAY FOR BLOOD PRESSURE 90 tablet 1  . Cholecalciferol (VITAMIN D-3) 5000 UNITS TABS Take 1 tablet by mouth daily.    Marland Kitchen estradiol (ESTRACE) 0.5 MG tablet take 1 tab daily    . furosemide (LASIX) 40 MG tablet TAKE 1 TABLET EVERY DAY  FOR  BLOOD  PRESSURE/FLUID 90 tablet 1  . levothyroxine (SYNTHROID, LEVOTHROID) 125 MCG tablet TAKE 1 TABLET EVERY DAY  OR AS DIRECTED 90 tablet 3  . LUMIGAN 0.01 % SOLN     . Magnesium 500 MG CAPS Take 3 capsules by mouth daily.    . meloxicam (MOBIC) 15 MG tablet Take 1/2 to 1 tablet daily with food as needed for pain & inflammation 90 tablet 1  . omeprazole (PRILOSEC) 40 MG capsule TAKE 1 CAPSULE EVERY DAY  FOR  ACID  REFLUX 90 capsule 3  . Red Yeast Rice 600 MG CAPS Take 1 capsule by mouth daily.    Marland Kitchen rOPINIRole (REQUIP) 1 MG tablet Take 1 mg by mouth 3 (three) times daily. prn    . timolol (TIMOPTIC) 0.5 % ophthalmic solution 1 drop 2 (two) times daily.    . traMADol (ULTRAM) 50 MG tablet Take 50 mg by mouth every 6 (six) hours as needed for pain.    Marland Kitchen diltiazem 2 % GEL Apply 1 application topically 3 (three) times daily. 30 g 0   No facility-administered medications prior to visit.   Past Medical History  Diagnosis Date  . Arthritis   . Glaucoma   .  Hypothyroidism   . Hypertension   . Hyperlipidemia   . GERD (gastroesophageal reflux disease)   . Prediabetes   . Diverticulosis   . Colon polyps     hyperplastic  . Anal fissure   . Hemorrhoids    Past Surgical History  Procedure Laterality Date  . Abdominal hysterectomy    . Appendectomy    . Joint replacement  2007    rt hip replacement  . Bladder suspension    . Knee arthroscopy  03/28/2011    Procedure: ARTHROSCOPY KNEE;  Surgeon: Kerin Salen, MD;  Location: [facility];  Service: Orthopedics;  Laterality: Right;  with partial lateral meniscectomy, Debridement of Chondromalacia   History   Social History  . Marital Status: Single    Spouse Name: N/A    Number of Children: N/A  . Years of Education: N/A   Social History Main Topics  . Smoking status: Former Smoker    Quit date: 03/22/1996  . Smokeless tobacco: Never Used  . Alcohol Use: 1.0 oz/week    2 drink(s) per week     Comment: occ  . Drug Use: No  . Sexual Activity: None   Other Topics Concern  . None   Social  History Narrative   Family History  Problem Relation Age of Onset  . Colon cancer Neg Hx   . Rectal cancer Neg Hx   . Stomach cancer Neg Hx   . Esophageal cancer Other     nefew- age 53    Physical Exam: General: Well developed , well nourished, no acute distress Head: Normocephalic and atraumatic Eyes:  sclerae anicteric, EOMI Ears: Normal auditory acuity Lungs: Clear throughout to auscultation Heart: Regular rate and rhythm; no murmurs, rubs or bruits Abdomen: Soft, non tender and non distended. No masses, hepatosplenomegaly or hernias noted. Normal Bowel sounds Musculoskeletal: Symmetrical with no gross deformities  Pulses:  Normal pulses noted Extremities: No clubbing, cyanosis, edema or deformities noted Neurological: Alert oriented x 4, grossly nonfocal Psychological:  Alert and cooperative. Normal mood and affect  Assessment and Recommendations:  1. Anal  fissure. Persistent rectal pain with defecation. History of internal hemorrhoids. Continue diltiazem 2% cream 3 times a day. Surgical referral for further management.

## 2014-03-12 ENCOUNTER — Other Ambulatory Visit: Payer: Self-pay | Admitting: *Deleted

## 2014-03-12 MED ORDER — ALPRAZOLAM 1 MG PO TABS: 1.0000 mg | ORAL_TABLET | Freq: Every evening | ORAL | Status: DC | PRN

## 2014-03-12 MED ORDER — OMEPRAZOLE 40 MG PO CPDR: DELAYED_RELEASE_CAPSULE | ORAL | Status: DC

## 2014-03-13 ENCOUNTER — Other Ambulatory Visit: Payer: Self-pay | Admitting: Physician Assistant

## 2014-03-13 ENCOUNTER — Other Ambulatory Visit: Payer: Self-pay

## 2014-03-13 DIAGNOSIS — K602 Anal fissure, unspecified: Secondary | ICD-10-CM

## 2014-04-08 ENCOUNTER — Ambulatory Visit: Payer: Commercial Managed Care - HMO | Admitting: Physician Assistant

## 2014-04-29 ENCOUNTER — Other Ambulatory Visit: Payer: Self-pay | Admitting: *Deleted

## 2014-04-29 MED ORDER — FUROSEMIDE 40 MG PO TABS: ORAL_TABLET | ORAL | Status: DC

## 2014-05-19 ENCOUNTER — Other Ambulatory Visit: Payer: Self-pay | Admitting: Physician Assistant

## 2014-06-25 ENCOUNTER — Other Ambulatory Visit: Payer: Self-pay | Admitting: Internal Medicine

## 2014-06-25 DIAGNOSIS — Z1231 Encounter for screening mammogram for malignant neoplasm of breast: Secondary | ICD-10-CM

## 2014-06-30 DIAGNOSIS — H4011X2 Primary open-angle glaucoma, moderate stage: Secondary | ICD-10-CM | POA: Diagnosis not present

## 2014-06-30 DIAGNOSIS — H35032 Hypertensive retinopathy, left eye: Secondary | ICD-10-CM | POA: Diagnosis not present

## 2014-06-30 DIAGNOSIS — H3562 Retinal hemorrhage, left eye: Secondary | ICD-10-CM | POA: Diagnosis not present

## 2014-06-30 DIAGNOSIS — H2513 Age-related nuclear cataract, bilateral: Secondary | ICD-10-CM | POA: Diagnosis not present

## 2014-07-03 ENCOUNTER — Ambulatory Visit (INDEPENDENT_AMBULATORY_CARE_PROVIDER_SITE_OTHER): Payer: Commercial Managed Care - HMO | Admitting: Internal Medicine

## 2014-07-03 ENCOUNTER — Encounter: Payer: Self-pay | Admitting: Internal Medicine

## 2014-07-03 VITALS — BP 124/72 | HR 64 | Temp 97.4°F | Resp 16 | Ht 68.0 in | Wt 219.6 lb

## 2014-07-03 DIAGNOSIS — Z79899 Other long term (current) drug therapy: Secondary | ICD-10-CM | POA: Diagnosis not present

## 2014-07-03 DIAGNOSIS — E785 Hyperlipidemia, unspecified: Secondary | ICD-10-CM

## 2014-07-03 DIAGNOSIS — G629 Polyneuropathy, unspecified: Secondary | ICD-10-CM

## 2014-07-03 DIAGNOSIS — Z1331 Encounter for screening for depression: Secondary | ICD-10-CM

## 2014-07-03 DIAGNOSIS — R7309 Other abnormal glucose: Secondary | ICD-10-CM

## 2014-07-03 DIAGNOSIS — E559 Vitamin D deficiency, unspecified: Secondary | ICD-10-CM

## 2014-07-03 DIAGNOSIS — E039 Hypothyroidism, unspecified: Secondary | ICD-10-CM

## 2014-07-03 DIAGNOSIS — I10 Essential (primary) hypertension: Secondary | ICD-10-CM

## 2014-07-03 DIAGNOSIS — M1990 Unspecified osteoarthritis, unspecified site: Secondary | ICD-10-CM

## 2014-07-03 DIAGNOSIS — Z9181 History of falling: Secondary | ICD-10-CM

## 2014-07-03 DIAGNOSIS — Z1212 Encounter for screening for malignant neoplasm of rectum: Secondary | ICD-10-CM

## 2014-07-03 DIAGNOSIS — G608 Other hereditary and idiopathic neuropathies: Secondary | ICD-10-CM

## 2014-07-03 DIAGNOSIS — E669 Obesity, unspecified: Secondary | ICD-10-CM

## 2014-07-03 DIAGNOSIS — R7303 Prediabetes: Secondary | ICD-10-CM

## 2014-07-03 DIAGNOSIS — K219 Gastro-esophageal reflux disease without esophagitis: Secondary | ICD-10-CM

## 2014-07-03 LAB — CBC WITH DIFFERENTIAL/PLATELET
Basophils Absolute: 0 10*3/uL (ref 0.0–0.1)
Basophils Relative: 1 % (ref 0–1)
Eosinophils Absolute: 0.2 10*3/uL (ref 0.0–0.7)
Eosinophils Relative: 5 % (ref 0–5)
HEMATOCRIT: 38.7 % (ref 36.0–46.0)
HEMOGLOBIN: 13.6 g/dL (ref 12.0–15.0)
LYMPHS ABS: 2 10*3/uL (ref 0.7–4.0)
LYMPHS PCT: 48 % — AB (ref 12–46)
MCH: 28.5 pg (ref 26.0–34.0)
MCHC: 35.1 g/dL (ref 30.0–36.0)
MCV: 81.1 fL (ref 78.0–100.0)
MONO ABS: 0.4 10*3/uL (ref 0.1–1.0)
MPV: 9.6 fL (ref 8.6–12.4)
Monocytes Relative: 10 % (ref 3–12)
NEUTROS ABS: 1.5 10*3/uL — AB (ref 1.7–7.7)
Neutrophils Relative %: 36 % — ABNORMAL LOW (ref 43–77)
Platelets: 225 10*3/uL (ref 150–400)
RBC: 4.77 MIL/uL (ref 3.87–5.11)
RDW: 13.6 % (ref 11.5–15.5)
WBC: 4.1 10*3/uL (ref 4.0–10.5)

## 2014-07-03 LAB — LIPID PANEL
CHOL/HDL RATIO: 2 ratio
CHOLESTEROL: 180 mg/dL (ref 0–200)
HDL: 91 mg/dL (ref >=46)
LDL CALC: 65 mg/dL (ref 0–99)
Triglycerides: 119 mg/dL (ref <150)
VLDL: 24 mg/dL (ref 0–40)

## 2014-07-03 LAB — HEPATIC FUNCTION PANEL
ALT: 15 U/L (ref 0–35)
AST: 17 U/L (ref 0–37)
Albumin: 4.2 g/dL (ref 3.5–5.2)
Alkaline Phosphatase: 45 U/L (ref 39–117)
BILIRUBIN DIRECT: 0.2 mg/dL (ref 0.0–0.3)
BILIRUBIN INDIRECT: 0.5 mg/dL (ref 0.2–1.2)
BILIRUBIN TOTAL: 0.7 mg/dL (ref 0.2–1.2)
Total Protein: 7.1 g/dL (ref 6.0–8.3)

## 2014-07-03 LAB — BASIC METABOLIC PANEL WITH GFR
BUN: 15 mg/dL (ref 6–23)
CALCIUM: 10 mg/dL (ref 8.4–10.5)
CHLORIDE: 101 meq/L (ref 96–112)
CO2: 26 meq/L (ref 19–32)
CREATININE: 1.01 mg/dL (ref 0.50–1.10)
GFR, Est African American: 64 mL/min
GFR, Est Non African American: 55 mL/min — ABNORMAL LOW
GLUCOSE: 88 mg/dL (ref 70–99)
Potassium: 4 mEq/L (ref 3.5–5.3)
Sodium: 139 mEq/L (ref 135–145)

## 2014-07-03 LAB — TSH: TSH: 5.517 u[IU]/mL — ABNORMAL HIGH (ref 0.350–4.500)

## 2014-07-03 LAB — HEMOGLOBIN A1C
HEMOGLOBIN A1C: 6.1 % — AB (ref <5.7)
Mean Plasma Glucose: 128 mg/dL — ABNORMAL HIGH (ref <117)

## 2014-07-03 LAB — MAGNESIUM: Magnesium: 1.9 mg/dL (ref 1.5–2.5)

## 2014-07-03 MED ORDER — ESTRADIOL 0.5 MG PO TABS: 0.5000 mg | ORAL_TABLET | Freq: Every day | ORAL | Status: DC

## 2014-07-03 MED ORDER — RANITIDINE HCL 300 MG PO TABS: ORAL_TABLET | ORAL | Status: DC

## 2014-07-03 NOTE — Progress Notes (Signed)
Patient ID: Rebecca Graves, female   DOB: 30-Oct-1940, 74 y.o.   MRN: [id number]  Annual Comprehensive Examination  This very nice 74 y.o. [name] presents for complete physical.  Patient has been followed for HTN, Prediabetes, Hyperlipidemia, and Vitamin D Deficiency.    HTN predates since 1997. Patient's BP has been controlled at home and patient denies any cardiac symptoms as chest pain, palpitations, shortness of breath, dizziness or ankle swelling. Today's BP: 124/72 mmHg    Patient's hyperlipidemia is controlled with diet. Last lipids were at goal - Total  Chol 180; HDL- 91; LDL  65; [id number] on 07/03/2014.   Patient has Morbid Obesity (BMI 33.40) and consequent prediabetes predating since 2011 with A1c 6.5% and patient denies reactive hypoglycemic symptoms, visual blurring, or diabetic polys, but does report burning/stinging paresthesias and shooting pains in her LE from the knees to the distal feet. She has had Nl B12 levels and neg heavy metal screening.  Last A1c was  6.2% on 01/06/2014.      Other problems include chronic LBP and generalized diffuse small joint arthralgias. She has been followed by Dr Nelva Bush and Dr Mina Marble and had MRI's showing bulging , but not ruptured discs. Finally, patient has history of Vitamin D Deficiency of 28 in 2008 and last Vitamin D was  90 on 01/06/2014.      Medication Sig  . ALPRAZolam  1 MG tablet TAKE 1 TABLET AT BEDTIME AS NEEDED  FOR  SLEEP  . SUPER B COMPLEX Take 1 tablet by mouth daily.  . bisoprolol-hctz (ZIAC) 10-6.25  TAKE 1 TABLET EVERY DAY FOR BLOOD PRESSURE  . VITAMIN D 5000 UNITS TABS Take 1 tablet by mouth daily.  Marland Kitchen VITAMIN B-12 5000 MCG SUBL Place 1 tablet under the tongue daily.   Marland Kitchen diltiazem 2 % GEL Apply 1 application topically 3 (three) times daily.  . furosemide (LASIX) 40 MG tablet TAKE 1 TABLET EVERY DAY  FOR  BLOOD  PRESSURE/FLUID  . levothyroxine  125 MCG tablet TAKE 1 TABLET EVERY DAY  OR AS DIRECTED  . LUMIGAN 0.01 % SOLN Place 1 drop  into both eyes at bedtime.   . Magnesium 500 MG CAPS Take 3 capsules by mouth daily.  Marland Kitchen omeprazole (PRILOSEC) 40 MG capsule TAKE 1 CAPSULE EVERY DAY  FOR  ACID  REFLUX  . Red Yeast Rice 600 MG CAPS Take 1 capsule by mouth daily.  Marland Kitchen rOPINIRole (REQUIP) 1 MG tablet Take 1 mg by mouth 3 (three) times daily. prn  . timolol (TIMOPTIC) 0.5 % ophth soln 1 drop 2 (two) times daily.  . traMADol  50 MG tablet Take 50 mg by mouth every 6 (six) hours as needed for pain.  Marland Kitchen estradiol  0.5 MG tablet TAKE 1 TABLET  daily)   No Known Allergies   Past Medical History  Diagnosis Date  . Arthritis   . Glaucoma   . Hypothyroidism   . Hypertension   . Hyperlipidemia   . GERD (gastroesophageal reflux disease)   . Prediabetes   . Diverticulosis   . Colon polyps     hyperplastic  . Anal fissure   . Hemorrhoids    Health Maintenance  Topic Date Due  . ZOSTAVAX  12/09/2000  . DEXA SCAN  12/09/2005  . PNA vac Low Risk Adult (2 of 2 - PCV13) 05/29/2009  . INFLUENZA VACCINE  09/29/2014  . MAMMOGRAM  07/10/2015  . TETANUS/TDAP  04/25/2017  . COLONOSCOPY  11/29/2017   Immunization  History  Administered Date(s) Administered  . Influenza-Unspecified 12/09/2013  . Pneumococcal Polysaccharide-23 05/29/2008  . Td 04/26/2007   Past Surgical History  Procedure Laterality Date  . Abdominal hysterectomy    . Appendectomy    . Joint replacement  2007    rt hip replacement  . Bladder suspension    . Knee arthroscopy  03/28/2011    Procedure: ARTHROSCOPY KNEE;  Surgeon: Kerin Salen, MD;  Location: [facility];  Service: Orthopedics;  Laterality: Right;  with partial lateral meniscectomy, Debridement of Chondromalacia   Family History  Problem Relation Age of Onset  . Colon cancer Neg Hx   . Rectal cancer Neg Hx   . Stomach cancer Neg Hx   . Esophageal cancer Other     nefew- age 89   History  Substance Use Topics  . Smoking status: Former Smoker    Quit date: 03/22/1996  .  Smokeless tobacco: Never Used  . Alcohol Use: 1.0 oz/week    2 drink(s) per week     Comment: occ    ROS Constitutional: Denies fever, chills, weight loss/gain, headaches, insomnia,  night sweats, and change in appetite. Does c/o fatigue. Eyes: Denies redness, blurred vision, diplopia, discharge, itchy, watery eyes.  ENT: Denies discharge, congestion, post nasal drip, epistaxis, sore throat, earache, hearing loss, dental pain, Tinnitus, Vertigo, Sinus pain, snoring.  Cardio: Denies chest pain, palpitations, irregular heartbeat, syncope, dyspnea, diaphoresis, orthopnea, PND, claudication, edema Respiratory: denies cough, dyspnea, DOE, pleurisy, hoarseness, laryngitis, wheezing.  Gastrointestinal: Denies dysphagia, heartburn, reflux, water brash, pain, cramps, nausea, vomiting, bloating, diarrhea, constipation, hematemesis, melena, hematochezia, jaundice, hemorrhoids Genitourinary: Denies dysuria, frequency, urgency, nocturia, hesitancy, discharge, hematuria, flank pain Breast: Breast lumps, nipple discharge, bleeding.  Musculoskeletal: Denies arthralgia, myalgia, stiffness, Jt. Swelling, pain, limp, and strain/sprain. Denies falls. Skin: Denies puritis, rash, hives, warts, acne, eczema, changing in skin lesion Neuro: No weakness, tremor, incoordination, spasms, paresthesia, pain Psychiatric: Denies confusion, memory loss, sensory loss. Denies Depression. Endocrine: Denies change in weight, skin, hair change, nocturia, and paresthesia, diabetic polys, visual blurring, hyper / hypo glycemic episodes.  Heme/Lymph: No excessive bleeding, bruising, enlarged lymph nodes.  Physical Exam  BP 124/72   Pulse 64  Temp 97.4 F   Resp 16  Ht 5\' 8"   Wt 219 lb 9.6 oz     BMI 33.40  General Appearance: Well nourished and in no apparent distress. Eyes: PERRLA, EOMs, conjunctiva no swelling or erythema, normal fundi and vessels. Sinuses: No frontal/maxillary tenderness ENT/Mouth: EACs patent / TMs   nl. Nares clear without erythema, swelling, mucoid exudates. Oral hygiene is good. No erythema, swelling, or exudate. Tongue normal, non-obstructing. Tonsils not swollen or erythematous. Hearing normal.  Neck: Supple, thyroid normal. No bruits, nodes or JVD. Respiratory: Respiratory effort normal.  BS equal and clear bilateral without rales, rhonci, wheezing or stridor. Cardio: Heart sounds are normal with regular rate and rhythm and no murmurs, rubs or gallops. Peripheral pulses are normal and equal bilaterally without edema. No aortic or femoral bruits. Chest: symmetric with normal excursions and percussion. Breasts: Symmetric, without lumps, nipple discharge, retractions, or fibrocystic changes.  Abdomen: Flat, soft, with bowl sounds. Nontender, no guarding, rebound, hernias, masses, or organomegaly.  Lymphatics: Non tender without lymphadenopathy.  Genitourinary:  Musculoskeletal: Full ROM all peripheral extremities, joint stability, 5/5 strength, and normal gait. Skin: Warm and dry without rashes, lesions, cyanosis, clubbing or  ecchymosis.  Neuro: Cranial nerves intact, reflexes equal bilaterally. Normal muscle tone, no cerebellar symptoms. Sensation decreased  over distal lower feet bilaterally.Marland Kitchen  Pysch: Awake and oriented X 3, normal affect, Insight and Judgment appropriate.   Assessment and Plan  1. Essential hypertension  - Microalbumin / creatinine urine ratio - EKG 12-Lead - [place], RETROPERITNL ABD,  LTD - TSH  2. Hyperlipidemia  - Lipid panel  3. Prediabetes  - Hemoglobin A1c - Insulin, random  4. Vitamin D deficiency  - Vit D  25 hydroxy   5. Hypothyroidism   6. Gastroesophageal reflux disease  - Discussed transitioning from PPI to Ranitidine   7. Osteoarthritis   8. Screening for rectal cancer  - POC Hemoccult Bld/Stl   9. Depression screen  - Screen Negative  10. At low risk for fall   11. Medication management  - Urine Microscopic - CBC with  Differential/Platelet - BASIC METABOLIC PANEL WITH GFR - Hepatic function panel - Magnesium  12. Morbid Obesity  (BMI 33.40) -   Continue prudent diet as discussed, weight control, BP monitoring, regular exercise, and medications. Discussed med's effects and SE's. Screening labs and tests as requested with regular follow-up as recommended.  Over 40 minutes of exam, counseling, chart review was performed.

## 2014-07-03 NOTE — Patient Instructions (Signed)
++++++++++++++++++++++++++++++++++  Recommend Low dose or baby Aspirin 81 mg daily   To reduce risk of Colon Cancer 20 %, Skin Cancer 26 % , Melanoma 46% and   Pancreatic cancer 60%  +++++++++++++++++++++++++++++++++ Vitamin D goal is between 70-100.   Please make sure that you are taking your Vitamin D as directed.   It is very important as a natural antiinflammatory   helping hair, skin, and nails, as well as reducing stroke and heart attack risk.   It helps your bones and helps with mood.  It also decreases numerous cancer risks so please take it as directed.   Low Vit D is associated with a 200-300% higher risk for CANCER   and 200-300% higher risk for HEART   ATTACK  &  STROKE.    ..................................................................................Marland Kitchen  It is also associated with higher death rate at younger ages,   autoimmune diseases like Rheumatoid arthritis, Lupus, Multiple Sclerosis.     Also many other serious conditions, like depression, Alzheimer's  Dementia, infertility, muscle aches, fatigue, fibromyalgia - just to name a few.  ++++++++++++++++++++++++++++++++++++++++ Recommend the book "The END of DIETING" by Dr Excell Seltzer   & the book "The END of DIABETES " by Dr Excell Seltzer  At [email or web address] - get book & Audio CD's     Being diabetic has a  300% increased risk for heart attack, stroke, cancer, and alzheimer- type vascular dementia. It is very important that you work harder with diet by avoiding all foods that are white. Avoid white rice (brown & wild rice is OK), white potatoes (sweetpotatoes in moderation is OK), White bread or wheat bread or anything made out of white flour like bagels, donuts, rolls, buns, biscuits, cakes, pastries, cookies, pizza crust, and pasta (made from white flour & egg whites) - vegetarian pasta or spinach or wheat pasta is OK. Multigrain breads like Arnold's or [employer], or multigrain sandwich thins or  flatbreads.  Diet, exercise and weight loss can reverse and cure diabetes in the early stages.  Diet, exercise and weight loss is very important in the control and prevention of complications of diabetes which affects every system in your body, ie. Brain - dementia/stroke, eyes - glaucoma/blindness, heart - heart attack/heart failure, kidneys - dialysis, stomach - gastric paralysis, intestines - malabsorption, nerves - severe painful neuritis, circulation - gangrene & loss of a leg(s), and finally cancer and Alzheimers.    I recommend avoid fried & greasy foods,  sweets/candy, white rice (brown or wild rice or Quinoa is OK), white potatoes (sweet potatoes are OK) - anything made from white flour - bagels, doughnuts, rolls, buns, biscuits,white and wheat breads, pizza crust and traditional pasta made of white flour & egg white(vegetarian pasta or spinach or wheat pasta is OK).  Multi-grain bread is OK - like multi-grain flat bread or sandwich thins. Avoid alcohol in excess. Exercise is also important.    Eat all the vegetables you want - avoid meat, especially red meat and dairy - especially cheese.  Cheese is the most concentrated form of trans-fats which is the worst thing to clog up our arteries. Veggie cheese is OK which can be found in the fresh produce section at [employer] or [employer] or [employer]  ++++++++++++++++++++++++++++++++++++++++++++++++++++++++ GETTING OFF OF PPI's    Nexium/protonix/prilosec/Omeprazole/Dexilant/Aciphex are called PPI's, they are great at healing your stomach but should only be taken for a short period of time.     Recent studies have shown that taken for a long time  they  can increase the risk of osteoporosis (weakening of your bones), pneumonia, low magnesium, restless legs, Cdiff (infection that causes diarrhea), DEMENTIA and most recently kidney damage / disease / insufficiency.     Due to this information we want to try to stop the PPI but if you try to stop  it abruptly this can cause rebound acid and worsening symptoms.   So this is how we want you to get off the PPI:  - Start taking the nexium/protonix/prilosec/PPI  every other day with  zantac (ranitidine) 2 x a day for 2-4 weeks  - then decrease the PPI to every 3 days while taking the zantac (ranitidine) twice a day the other  days for 2-4  Weeks  - then you can try the zantac (ranitidine) once at night or up to 2 x day as needed.  - you can continue on this once at night or stop all together  - Avoid alcohol, spicy foods, NSAIDS (aleve, ibuprofen) at this time. See foods below.   +++++++++++++++++++++++++++++++++++++++++++  Food Choices for Gastroesophageal Reflux Disease  When you have gastroesophageal reflux disease (GERD), the foods you eat and your eating habits are very important. Choosing the right foods can help ease the discomfort of GERD. WHAT GENERAL GUIDELINES DO I NEED TO FOLLOW?  Choose fruits, vegetables, whole grains, low-fat dairy products, and low-fat meat, fish, and poultry.  Limit fats such as oils, salad dressings, butter, nuts, and avocado.  Keep a food diary to identify foods that cause symptoms.  Avoid foods that cause reflux. These may be different for different people.  Eat frequent small meals instead of three large meals each day.  Eat your meals slowly, in a relaxed setting.  Limit fried foods.  Cook foods using methods other than frying.  Avoid drinking alcohol.  Avoid drinking large amounts of liquids with your meals.  Avoid bending over or lying down until 2-3 hours after eating.   WHAT FOODS ARE NOT RECOMMENDED? The following are some foods and drinks that may worsen your symptoms:  Vegetables Tomatoes. Tomato juice. Tomato and spaghetti sauce. Chili peppers. Onion and garlic. Horseradish. Fruits Oranges, grapefruit, and lemon (fruit and juice). Meats High-fat meats, fish, and poultry. This includes hot dogs, ribs, ham, sausage,  salami, and bacon. Dairy Whole milk and chocolate milk. Sour cream. Cream. Butter. Ice cream. Cream cheese.  Beverages Coffee and tea, with or without caffeine. Carbonated beverages or energy drinks. Condiments Hot sauce. Barbecue sauce.  Sweets/Desserts Chocolate and cocoa. Donuts. Peppermint and spearmint. Fats and Oils High-fat foods, including [place] fries and potato chips. Other Vinegar. Strong spices, such as black pepper, white pepper, red pepper, cayenne, curry powder, cloves, ginger, and chili powder. Nexium/protonix/prilosec are called PPI's, they are great at healing your stomach but should only be taken for a short period of time.   +++++++++++++++++++++++++++++++++++ Preventive Care for Adults A healthy lifestyle and preventive care can promote health and wellness. Preventive health guidelines for women include the following key practices.  A routine yearly physical is a good way to check with your health care provider about your health and preventive screening. It is a chance to share any concerns and updates on your health and to receive a thorough exam.  Visit your dentist for a routine exam and preventive care every 6 months. Brush your teeth twice a day and floss once a day. Good oral hygiene prevents tooth decay and gum disease.  The frequency of eye exams is based on your  age, health, family medical history, use of contact lenses, and other factors. Follow your health care provider's recommendations for frequency of eye exams.  Eat a healthy diet. Foods like vegetables, fruits, whole grains, low-fat dairy products, and lean protein foods contain the nutrients you need without too many calories. Decrease your intake of foods high in solid fats, added sugars, and salt. Eat the right amount of calories for you.Get information about a proper diet from your health care provider, if necessary.  Regular physical exercise is one of the most important things you can do for your  health. Most adults should get at least 150 minutes of moderate-intensity exercise (any activity that increases your heart rate and causes you to sweat) each week. In addition, most adults need muscle-strengthening exercises on 2 or more days a week.  Maintain a healthy weight. The body mass index (BMI) is a screening tool to identify possible weight problems. It provides an estimate of body fat based on height and weight. Your health care provider can find your BMI and can help you achieve or maintain a healthy weight.For adults 20 years and older:  A BMI below 18.5 is considered underweight.  A BMI of 18.5 to 24.9 is normal.  A BMI of 25 to 29.9 is considered overweight.  A BMI of 30 and above is considered obese.  Maintain normal blood lipids and cholesterol levels by exercising and minimizing your intake of saturated fat. Eat a balanced diet with plenty of fruit and vegetables. If your lipid or cholesterol levels are high, you are over 50, or you are at high risk for heart disease, you may need your cholesterol levels checked more frequently.Ongoing high lipid and cholesterol levels should be treated with medicines if diet and exercise are not working.  If you smoke, find out from your health care provider how to quit. If you do not use tobacco, do not start.  Lung cancer screening is recommended for adults aged 30-80 years who are at high risk for developing lung cancer because of a history of smoking. A yearly low-dose CT scan of the lungs is recommended for people who have at least a 30-pack-year history of smoking and are a current smoker or have quit within the past 15 years. A pack year of smoking is smoking an average of 1 pack of cigarettes a day for 1 year (for example: 1 pack a day for 30 years or 2 packs a day for 15 years). Yearly screening should continue until the smoker has stopped smoking for at least 15 years. Yearly screening should be stopped for people who develop a health  problem that would prevent them from having lung cancer treatment.  Avoid use of street drugs. Do not share needles with anyone. Ask for help if you need support or instructions about stopping the use of drugs.  High blood pressure causes heart disease and increases the risk of stroke.  Ongoing high blood pressure should be treated with medicines if weight loss and exercise do not work.  If you are 51-76 years old, ask your health care provider if you should take aspirin to prevent strokes.  Diabetes screening involves taking a blood sample to check your fasting blood sugar level. This should be done once every 3 years, after age 76, if you are within normal weight and without risk factors for diabetes. Testing should be considered at a younger age or be carried out more frequently if you are overweight and have at least 1  risk factor for diabetes.  Breast cancer screening is essential preventive care for women. You should practice "breast self-awareness." This means understanding the normal appearance and feel of your breasts and may include breast self-examination. Any changes detected, no matter how small, should be reported to a health care provider. Women in their 31s and 30s should have a clinical breast exam (CBE) by a health care provider as part of a regular health exam every 1 to 3 years. After age 68, women should have a CBE every year. Starting at age 72, women should consider having a mammogram (breast X-ray test) every year. Women who have a family history of breast cancer should talk to their health care provider about genetic screening. Women at a high risk of breast cancer should talk to their health care providers about having an MRI and a mammogram every year.  Breast cancer gene (BRCA)-related cancer risk assessment is recommended for women who have family members with BRCA-related cancers. BRCA-related cancers include breast, ovarian, tubal, and peritoneal cancers. Having family  members with these cancers may be associated with an increased risk for harmful changes (mutations) in the breast cancer genes BRCA1 and BRCA2. Results of the assessment will determine the need for genetic counseling and BRCA1 and BRCA2 testing.  Routine pelvic exams to screen for cancer are no longer recommended for nonpregnant women who are considered low risk for cancer of the pelvic organs (ovaries, uterus, and vagina) and who do not have symptoms. Ask your health care provider if a screening pelvic exam is right for you.  If you have had past treatment for cervical cancer or a condition that could lead to cancer, you need Pap tests and screening for cancer for at least 20 years after your treatment. If Pap tests have been discontinued, your risk factors (such as having a new sexual partner) need to be reassessed to determine if screening should be resumed. Some women have medical problems that increase the chance of getting cervical cancer. In these cases, your health care provider may recommend more frequent screening and Pap tests.    Colorectal cancer can be detected and often prevented. Most routine colorectal cancer screening begins at the age of 62 years and continues through age 24 years. However, your health care provider may recommend screening at an earlier age if you have risk factors for colon cancer. On a yearly basis, your health care provider may provide home test kits to check for hidden blood in the stool. Use of a small camera at the end of a tube, to directly examine the colon (sigmoidoscopy or colonoscopy), can detect the earliest forms of colorectal cancer. Talk to your health care provider about this at age 39, when routine screening begins. Direct exam of the colon should be repeated every 5-10 years through age 39 years, unless early forms of pre-cancerous polyps or small growths are found.  Osteoporosis is a disease in which the bones lose minerals and strength with aging.  This can result in serious bone fractures or breaks. The risk of osteoporosis can be identified using a bone density scan. Women ages 73 years and over and women at risk for fractures or osteoporosis should discuss screening with their health care providers. Ask your health care provider whether you should take a calcium supplement or vitamin D to reduce the rate of osteoporosis.  Menopause can be associated with physical symptoms and risks. Hormone replacement therapy is available to decrease symptoms and risks. You should talk to your  health care provider about whether hormone replacement therapy is right for you.  Use sunscreen. Apply sunscreen liberally and repeatedly throughout the day. You should seek shade when your shadow is shorter than you. Protect yourself by wearing long sleeves, pants, a wide-brimmed hat, and sunglasses year round, whenever you are outdoors.  Once a month, do a whole body skin exam, using a mirror to look at the skin on your back. Tell your health care provider of new moles, moles that have irregular borders, moles that are larger than a pencil eraser, or moles that have changed in shape or color.  Stay current with required vaccines (immunizations).  Influenza vaccine. All adults should be immunized every year.  Tetanus, diphtheria, and acellular pertussis (Td, Tdap) vaccine. Pregnant women should receive 1 dose of Tdap vaccine during each pregnancy. The dose should be obtained regardless of the length of time since the last dose. Immunization is preferred during the 27th-36th week of gestation. An adult who has not previously received Tdap or who does not know her vaccine status should receive 1 dose of Tdap. This initial dose should be followed by tetanus and diphtheria toxoids (Td) booster doses every 10 years. Adults with an unknown or incomplete history of completing a 3-dose immunization series with Td-containing vaccines should begin or complete a primary  immunization series including a Tdap dose. Adults should receive a Td booster every 10 years.    Zoster vaccine. One dose is recommended for adults aged 29 years or older unless certain conditions are present.    Pneumococcal 13-valent conjugate (PCV13) vaccine. When indicated, a person who is uncertain of her immunization history and has no record of immunization should receive the PCV13 vaccine. An adult aged 47 years or older who has certain medical conditions and has not been previously immunized should receive 1 dose of PCV13 vaccine. This PCV13 should be followed with a dose of pneumococcal polysaccharide (PPSV23) vaccine. The PPSV23 vaccine dose should be obtained at least 8 weeks after the dose of PCV13 vaccine. An adult aged 54 years or older who has certain medical conditions and previously received 1 or more doses of PPSV23 vaccine should receive 1 dose of PCV13. The PCV13 vaccine dose should be obtained 1 or more years after the last PPSV23 vaccine dose.    Pneumococcal polysaccharide (PPSV23) vaccine. When PCV13 is also indicated, PCV13 should be obtained first. All adults aged 38 years and older should be immunized. An adult younger than age 17 years who has certain medical conditions should be immunized. Any person who resides in a nursing home or long-term care facility should be immunized. An adult smoker should be immunized. People with an immunocompromised condition and certain other conditions should receive both PCV13 and PPSV23 vaccines. People with human immunodeficiency virus (HIV) infection should be immunized as soon as possible after diagnosis. Immunization during chemotherapy or radiation therapy should be avoided. Routine use of PPSV23 vaccine is not recommended for American Indians, [place]atives, or people younger than 65 years unless there are medical conditions that require PPSV23 vaccine. When indicated, people who have unknown immunization and have no record of  immunization should receive PPSV23 vaccine. One-time revaccination 5 years after the first dose of PPSV23 is recommended for people aged 19-64 years who have chronic kidney failure, nephrotic syndrome, asplenia, or immunocompromised conditions. People who received 1-2 doses of PPSV23 before age 72 years should receive another dose of PPSV23 vaccine at age 61 years or later if at least 5 years  have passed since the previous dose. Doses of PPSV23 are not needed for people immunized with PPSV23 at or after age 3 years.   Preventive Services / Frequency  Ages 43 years and over  Blood pressure check.  Lipid and cholesterol check.  Lung cancer screening. / Every year if you are aged 50-80 years and have a 30-pack-year history of smoking and currently smoke or have quit within the past 15 years. Yearly screening is stopped once you have quit smoking for at least 15 years or develop a health problem that would prevent you from having lung cancer treatment.  Clinical breast exam.** / Every year after age 16 years.  BRCA-related cancer risk assessment.** / For women who have family members with a BRCA-related cancer (breast, ovarian, tubal, or peritoneal cancers).  Mammogram.** / Every year beginning at age 65 years and continuing for as long as you are in good health. Consult with your health care provider.  Pap test.** / Every 3 years starting at age 51 years through age 79 or 36 years with 3 consecutive normal Pap tests. Testing can be stopped between 65 and 70 years with 3 consecutive normal Pap tests and no abnormal Pap or HPV tests in the past 10 years.  Fecal occult blood test (FOBT) of stool. / Every year beginning at age 59 years and continuing until age 12 years. You may not need to do this test if you get a colonoscopy every 10 years.  Flexible sigmoidoscopy or colonoscopy.** / Every 5 years for a flexible sigmoidoscopy or every 10 years for a colonoscopy beginning at age 19 years and  continuing until age 49 years.  Hepatitis C blood test.** / For all people born from [birth year] through 1965 and any individual with known risks for hepatitis C.  Osteoporosis screening.** / A one-time screening for women ages 55 years and over and women at risk for fractures or osteoporosis.  Skin self-exam. / Monthly.  Influenza vaccine. / Every year.  Tetanus, diphtheria, and acellular pertussis (Tdap/Td) vaccine.** / 1 dose of Td every 10 years.  Zoster vaccine.** / 1 dose for adults aged 21 years or older.  Pneumococcal 13-valent conjugate (PCV13) vaccine.** / Consult your health care provider.  Pneumococcal polysaccharide (PPSV23) vaccine.** / 1 dose for all adults aged 41 years and older. Screening for abdominal aortic aneurysm (AAA)  by ultrasound is recommended for people who have history of high blood pressure or who are current or former smokers.

## 2014-07-04 LAB — INSULIN, RANDOM: Insulin: 15 u[IU]/mL (ref 2.0–19.6)

## 2014-07-04 LAB — URINALYSIS, MICROSCOPIC ONLY
CRYSTALS: NONE SEEN
Casts: NONE SEEN
Squamous Epithelial / LPF: NONE SEEN

## 2014-07-04 LAB — MICROALBUMIN / CREATININE URINE RATIO
CREATININE, URINE: 76.6 mg/dL
Microalb Creat Ratio: 5.2 mg/g (ref 0.0–30.0)
Microalb, Ur: 0.4 mg/dL (ref <2.0)

## 2014-07-04 LAB — VITAMIN D 25 HYDROXY (VIT D DEFICIENCY, FRACTURES): VIT D 25 HYDROXY: 68 ng/mL (ref 30–100)

## 2014-07-09 ENCOUNTER — Other Ambulatory Visit: Payer: Self-pay | Admitting: Emergency Medicine

## 2014-07-09 ENCOUNTER — Other Ambulatory Visit: Payer: Self-pay | Admitting: Internal Medicine

## 2014-07-15 ENCOUNTER — Ambulatory Visit (HOSPITAL_COMMUNITY)
Admission: RE | Admit: 2014-07-15 | Discharge: 2014-07-15 | Disposition: A | Discharge status: Home / Self Care | Payer: Commercial Managed Care - HMO | Source: Ambulatory Visit | Attending: Internal Medicine | Admitting: Internal Medicine

## 2014-07-15 DIAGNOSIS — Z1231 Encounter for screening mammogram for malignant neoplasm of breast: Secondary | ICD-10-CM | POA: Insufficient documentation

## 2014-08-12 ENCOUNTER — Other Ambulatory Visit: Payer: Self-pay | Admitting: *Deleted

## 2014-08-12 DIAGNOSIS — Z1212 Encounter for screening for malignant neoplasm of rectum: Secondary | ICD-10-CM

## 2014-08-12 LAB — POC HEMOCCULT BLD/STL (HOME/3-CARD/SCREEN)
Card #2 Fecal Occult Blod, POC: NEGATIVE
FECAL OCCULT BLD: NEGATIVE
FECAL OCCULT BLD: NEGATIVE

## 2014-09-01 ENCOUNTER — Encounter: Payer: Self-pay | Admitting: *Deleted

## 2014-09-16 DIAGNOSIS — H4011X2 Primary open-angle glaucoma, moderate stage: Secondary | ICD-10-CM | POA: Diagnosis not present

## 2014-10-14 ENCOUNTER — Encounter: Payer: Self-pay | Admitting: Internal Medicine

## 2014-10-14 ENCOUNTER — Ambulatory Visit (INDEPENDENT_AMBULATORY_CARE_PROVIDER_SITE_OTHER): Payer: Commercial Managed Care - HMO | Admitting: Internal Medicine

## 2014-10-14 VITALS — BP 122/70 | HR 60 | Temp 98.4°F | Resp 18 | Ht 68.0 in | Wt 222.0 lb

## 2014-10-14 DIAGNOSIS — E559 Vitamin D deficiency, unspecified: Secondary | ICD-10-CM | POA: Diagnosis not present

## 2014-10-14 DIAGNOSIS — R7309 Other abnormal glucose: Secondary | ICD-10-CM

## 2014-10-14 DIAGNOSIS — E039 Hypothyroidism, unspecified: Secondary | ICD-10-CM

## 2014-10-14 DIAGNOSIS — E785 Hyperlipidemia, unspecified: Secondary | ICD-10-CM | POA: Diagnosis not present

## 2014-10-14 DIAGNOSIS — I10 Essential (primary) hypertension: Secondary | ICD-10-CM | POA: Diagnosis not present

## 2014-10-14 DIAGNOSIS — Z79899 Other long term (current) drug therapy: Secondary | ICD-10-CM

## 2014-10-14 DIAGNOSIS — R7303 Prediabetes: Secondary | ICD-10-CM

## 2014-10-14 LAB — BASIC METABOLIC PANEL WITH GFR
BUN: 16 mg/dL (ref 7–25)
CO2: 29 mmol/L (ref 20–31)
CREATININE: 1 mg/dL — AB (ref 0.60–0.93)
Calcium: 10 mg/dL (ref 8.6–10.4)
Chloride: 103 mmol/L (ref 98–110)
GFR, EST AFRICAN AMERICAN: 65 mL/min (ref >=60)
GFR, Est Non African American: 56 mL/min — ABNORMAL LOW (ref >=60)
Glucose, Bld: 122 mg/dL — ABNORMAL HIGH (ref 65–99)
POTASSIUM: 4.2 mmol/L (ref 3.5–5.3)
SODIUM: 141 mmol/L (ref 135–146)

## 2014-10-14 LAB — CBC WITH DIFFERENTIAL/PLATELET
Basophils Absolute: 0 10*3/uL (ref 0.0–0.1)
Basophils Relative: 1 % (ref 0–1)
EOS PCT: 5 % (ref 0–5)
Eosinophils Absolute: 0.2 10*3/uL (ref 0.0–0.7)
HCT: 39 % (ref 36.0–46.0)
Hemoglobin: 13.3 g/dL (ref 12.0–15.0)
LYMPHS ABS: 2.3 10*3/uL (ref 0.7–4.0)
Lymphocytes Relative: 57 % — ABNORMAL HIGH (ref 12–46)
MCH: 27.9 pg (ref 26.0–34.0)
MCHC: 34.1 g/dL (ref 30.0–36.0)
MCV: 81.8 fL (ref 78.0–100.0)
MPV: 9.3 fL (ref 8.6–12.4)
Monocytes Absolute: 0.3 10*3/uL (ref 0.1–1.0)
Monocytes Relative: 8 % (ref 3–12)
Neutro Abs: 1.2 10*3/uL — ABNORMAL LOW (ref 1.7–7.7)
Neutrophils Relative %: 29 % — ABNORMAL LOW (ref 43–77)
PLATELETS: 239 10*3/uL (ref 150–400)
RBC: 4.77 MIL/uL (ref 3.87–5.11)
RDW: 14.1 % (ref 11.5–15.5)
WBC: 4.1 10*3/uL (ref 4.0–10.5)

## 2014-10-14 LAB — HEPATIC FUNCTION PANEL
ALK PHOS: 52 U/L (ref 33–130)
ALT: 15 U/L (ref 6–29)
AST: 17 U/L (ref 10–35)
Albumin: 4 g/dL (ref 3.6–5.1)
BILIRUBIN DIRECT: 0.1 mg/dL (ref <=0.2)
BILIRUBIN TOTAL: 0.6 mg/dL (ref 0.2–1.2)
Indirect Bilirubin: 0.5 mg/dL (ref 0.2–1.2)
Total Protein: 6.7 g/dL (ref 6.1–8.1)

## 2014-10-14 LAB — LIPID PANEL
Cholesterol: 168 mg/dL (ref 125–200)
HDL: 62 mg/dL (ref >=46)
LDL Cholesterol: 73 mg/dL (ref <130)
TRIGLYCERIDES: 166 mg/dL — AB (ref <150)
Total CHOL/HDL Ratio: 2.7 Ratio (ref <=5.0)
VLDL: 33 mg/dL — ABNORMAL HIGH (ref <30)

## 2014-10-14 LAB — MAGNESIUM: MAGNESIUM: 1.6 mg/dL (ref 1.5–2.5)

## 2014-10-14 MED ORDER — TRAMADOL HCL 50 MG PO TABS: 50.0000 mg | ORAL_TABLET | Freq: Four times a day (QID) | ORAL | Status: DC | PRN

## 2014-10-14 NOTE — Patient Instructions (Addendum)
Decrease your magnesium to twice daily.  If you continue to have diarrhea than you can go down to one tablet per day.    Please try taking melatonin up to 15 mg at nighttime about 10-15 minutes prior to bed.  It can be found in the supplement section.

## 2014-10-14 NOTE — Progress Notes (Signed)
Patient ID: Rebecca Graves, female   DOB: [birth year], 74 y.o.   MRN: [id number]  Assessment and Plan:  Hypertension:  -Continue medication,  -monitor blood pressure at home.  -Continue DASH diet.   -Reminder to go to the ER if any CP, SOB, nausea, dizziness, severe HA, changes vision/speech, left arm numbness and tingling, and jaw pain.  Cholesterol: -Continue diet and exercise.  -Check cholesterol.   Pre-diabetes: -Continue diet and exercise.  -Check A1C  Vitamin D Def: -check level -continue medications.   Continue diet and meds as discussed. Further disposition pending results of labs.  HPI 74 y.o. female  presents for 3 month follow up with hypertension, hyperlipidemia, prediabetes and vitamin D.   Her blood pressure has been controlled at home, today their BP is BP: 122/70 mmHg.   She does workout. She denies chest pain, shortness of breath, dizziness.  She reports that she swims and also does some   She is on cholesterol medication and denies myalgias. Her cholesterol is at goal. The cholesterol last visit was:   Lab Results  Component Value Date   CHOL 180 07/03/2014   HDL 91 07/03/2014   LDLCALC 65 07/03/2014   TRIG 119 07/03/2014   CHOLHDL 2.0 07/03/2014     She has been working on diet and exercise for prediabetes, and denies foot ulcerations, hyperglycemia, hypoglycemia , increased appetite, nausea, paresthesia of the feet, polydipsia, polyuria, visual disturbances, vomiting and weight loss. Last A1C in the office was:  Lab Results  Component Value Date   HGBA1C 6.1* 07/03/2014  She reports that she is eating whatever she wants.    Patient is on Vitamin D supplement.  Lab Results  Component Value Date   VD25OH 68 07/03/2014     She does report that she has some double voiding and also some issues with leakage of urine.  She reports that she doesn't drink a lot of caffeine or alcohol.    Current Medications:  Current Outpatient Prescriptions on File Prior  to Visit  Medication Sig Dispense Refill  . ALPRAZolam (XANAX) 1 MG tablet TAKE 1 TABLET AT BEDTIME AS NEEDED  FOR  SLEEP 90 tablet 1  . B Complex-C (SUPER B COMPLEX PO) Take 1 tablet by mouth daily.    . bisoprolol-hydrochlorothiazide (ZIAC) 10-6.25 MG per tablet TAKE 1 TABLET EVERY DAY FOR BLOOD PRESSURE 90 tablet 1  . Cholecalciferol (VITAMIN D-3) 5000 UNITS TABS Take 1 tablet by mouth daily.    . Cyanocobalamin (VITAMIN B-12) 5000 MCG SUBL Place 1 tablet under the tongue daily.     Marland Kitchen estradiol (ESTRACE) 0.5 MG tablet Take 1 tablet (0.5 mg total) by mouth daily. 90 tablet 1  . furosemide (LASIX) 40 MG tablet TAKE 1 TABLET EVERY DAY  FOR  BLOOD  PRESSURE/FLUID 90 tablet 1  . levothyroxine (SYNTHROID, LEVOTHROID) 125 MCG tablet TAKE 1 TABLET EVERY DAY  OR AS DIRECTED 90 tablet 3  . LUMIGAN 0.01 % SOLN Place 1 drop into both eyes at bedtime.     . Magnesium 500 MG CAPS Take 3 capsules by mouth daily.    . meloxicam (MOBIC) 15 MG tablet TAKE 1 TABLET EVERY DAY  AFTER  A  MEAL  FOR  ARTHRITIS  PAIN -CONTACT DR OFFICE FOR APPOINTMENT 90 tablet 3  . omeprazole (PRILOSEC) 40 MG capsule TAKE 1 CAPSULE EVERY DAY  FOR  ACID  REFLUX 90 capsule 3  . Red Yeast Rice 600 MG CAPS Take 1 capsule by mouth  daily.    . rOPINIRole (REQUIP) 1 MG tablet Take 1 mg by mouth 3 (three) times daily. prn    . timolol (TIMOPTIC) 0.5 % ophthalmic solution 1 drop 2 (two) times daily.    . traMADol (ULTRAM) 50 MG tablet Take 50 mg by mouth every 6 (six) hours as needed for pain.     No current facility-administered medications on file prior to visit.    Medical History:  Past Medical History  Diagnosis Date  . Arthritis   . Glaucoma   . Hypothyroidism   . Hypertension   . Hyperlipidemia   . GERD (gastroesophageal reflux disease)   . Prediabetes   . Diverticulosis   . Colon polyps     hyperplastic  . Anal fissure   . Hemorrhoids     Allergies: No Known Allergies   Review of Systems:  Review of Systems   Constitutional: Negative for fever, chills and malaise/fatigue.  HENT: Negative for congestion, ear pain and sore throat.   Eyes: Negative.   Respiratory: Negative for cough, shortness of breath and wheezing.   Cardiovascular: Positive for leg swelling. Negative for chest pain and palpitations.  Gastrointestinal: Positive for heartburn. Negative for diarrhea, constipation, blood in stool and melena.  Skin: Negative.   Neurological: Negative for headaches.    Family history- Review and unchanged  Social history- Review and unchanged  Physical Exam: BP 122/70 mmHg  Pulse 60  Temp(Src) 98.4 F (36.9 C) (Temporal)  Resp 18  Ht 5\' 8"  (1.727 m)  Wt 222 lb (100.699 kg)  BMI 33.76 kg/m2 Wt Readings from Last 3 Encounters:  10/14/14 222 lb (100.699 kg)  07/03/14 219 lb 9.6 oz (99.61 kg)  03/10/14 225 lb 4 oz (102.173 kg)    General Appearance: Well nourished well developed, in no apparent distress. Eyes: PERRLA, EOMs, conjunctiva no swelling or erythema ENT/Mouth: Ear canals normal without obstruction, swelling, erythma, discharge.  TMs normal bilaterally.  Oropharynx moist, clear, without exudate, or postoropharyngeal swelling. Neck: Supple, thyroid normal,no cervical adenopathy  Respiratory: Respiratory effort normal, Breath sounds clear A&P without rhonchi, wheeze, or rale.  No retractions, no accessory usage. Cardio: RRR with no RGs 2/6 systolic murmur on LSB. Brisk peripheral pulses with trace edema bilaterally.  Bilateral varicose veins present.  Abdomen: Soft, + BS,  Non tender, no guarding, rebound, hernias, masses. Musculoskeletal: Full ROM, 5/5 strength, Normal gait Skin: Warm, dry without rashes, lesions, ecchymosis.  Neuro: Awake and oriented X 3, Cranial nerves intact. Normal muscle tone, no cerebellar symptoms. Psych: Normal affect, Insight and Judgment appropriate.    Starlyn Skeans, PA-C 2:40 PM [facility]

## 2014-10-15 ENCOUNTER — Other Ambulatory Visit: Payer: Self-pay | Admitting: Internal Medicine

## 2014-10-15 ENCOUNTER — Other Ambulatory Visit: Payer: Self-pay | Admitting: *Deleted

## 2014-10-15 LAB — TSH: TSH: 0.042 u[IU]/mL — AB (ref 0.350–4.500)

## 2014-10-15 LAB — HEMOGLOBIN A1C
Hgb A1c MFr Bld: 6.1 % — ABNORMAL HIGH (ref <5.7)
Mean Plasma Glucose: 128 mg/dL — ABNORMAL HIGH (ref <117)

## 2014-10-15 LAB — VITAMIN D 25 HYDROXY (VIT D DEFICIENCY, FRACTURES): Vit D, 25-Hydroxy: 69 ng/mL (ref 30–100)

## 2014-10-15 LAB — INSULIN, RANDOM: Insulin: 27.9 u[IU]/mL — ABNORMAL HIGH (ref 2.0–19.6)

## 2014-10-15 MED ORDER — LEVOTHYROXINE SODIUM 100 MCG PO TABS: 100.0000 ug | ORAL_TABLET | Freq: Every day | ORAL | Status: DC

## 2014-10-28 ENCOUNTER — Other Ambulatory Visit: Payer: Self-pay | Admitting: *Deleted

## 2014-10-28 MED ORDER — TRAMADOL HCL 50 MG PO TABS: 50.0000 mg | ORAL_TABLET | Freq: Four times a day (QID) | ORAL | Status: DC | PRN

## 2014-11-10 ENCOUNTER — Other Ambulatory Visit: Payer: Self-pay | Admitting: Internal Medicine

## 2014-11-12 ENCOUNTER — Encounter: Payer: Self-pay | Admitting: Gastroenterology

## 2014-12-20 ENCOUNTER — Other Ambulatory Visit: Payer: Self-pay | Admitting: Internal Medicine

## 2014-12-22 ENCOUNTER — Other Ambulatory Visit: Payer: Self-pay | Admitting: *Deleted

## 2014-12-22 MED ORDER — ALPRAZOLAM 1 MG PO TABS: 1.0000 mg | ORAL_TABLET | Freq: Every evening | ORAL | Status: DC | PRN

## 2014-12-25 DIAGNOSIS — H401131 Primary open-angle glaucoma, bilateral, mild stage: Secondary | ICD-10-CM | POA: Diagnosis not present

## 2015-01-14 ENCOUNTER — Other Ambulatory Visit: Payer: Self-pay | Admitting: Internal Medicine

## 2015-01-15 ENCOUNTER — Encounter: Payer: Self-pay | Admitting: Internal Medicine

## 2015-01-15 ENCOUNTER — Ambulatory Visit (INDEPENDENT_AMBULATORY_CARE_PROVIDER_SITE_OTHER): Payer: Commercial Managed Care - HMO | Admitting: Internal Medicine

## 2015-01-15 VITALS — BP 134/72 | HR 56 | Temp 97.6°F | Resp 16 | Ht 68.0 in | Wt 224.6 lb

## 2015-01-15 DIAGNOSIS — Z1389 Encounter for screening for other disorder: Secondary | ICD-10-CM | POA: Diagnosis not present

## 2015-01-15 DIAGNOSIS — Z9181 History of falling: Secondary | ICD-10-CM

## 2015-01-15 DIAGNOSIS — E559 Vitamin D deficiency, unspecified: Secondary | ICD-10-CM

## 2015-01-15 DIAGNOSIS — Z6833 Body mass index (BMI) 33.0-33.9, adult: Secondary | ICD-10-CM | POA: Diagnosis not present

## 2015-01-15 DIAGNOSIS — E039 Hypothyroidism, unspecified: Secondary | ICD-10-CM | POA: Diagnosis not present

## 2015-01-15 DIAGNOSIS — R7303 Prediabetes: Secondary | ICD-10-CM | POA: Diagnosis not present

## 2015-01-15 DIAGNOSIS — Z79899 Other long term (current) drug therapy: Secondary | ICD-10-CM

## 2015-01-15 DIAGNOSIS — E1140 Type 2 diabetes mellitus with diabetic neuropathy, unspecified: Secondary | ICD-10-CM

## 2015-01-15 DIAGNOSIS — K219 Gastro-esophageal reflux disease without esophagitis: Secondary | ICD-10-CM

## 2015-01-15 DIAGNOSIS — Z1331 Encounter for screening for depression: Secondary | ICD-10-CM

## 2015-01-15 DIAGNOSIS — E119 Type 2 diabetes mellitus without complications: Secondary | ICD-10-CM | POA: Diagnosis not present

## 2015-01-15 DIAGNOSIS — Z789 Other specified health status: Secondary | ICD-10-CM | POA: Diagnosis not present

## 2015-01-15 DIAGNOSIS — I10 Essential (primary) hypertension: Secondary | ICD-10-CM | POA: Diagnosis not present

## 2015-01-15 DIAGNOSIS — R7309 Other abnormal glucose: Secondary | ICD-10-CM | POA: Diagnosis not present

## 2015-01-15 DIAGNOSIS — E785 Hyperlipidemia, unspecified: Secondary | ICD-10-CM

## 2015-01-15 DIAGNOSIS — E1121 Type 2 diabetes mellitus with diabetic nephropathy: Secondary | ICD-10-CM

## 2015-01-15 LAB — HEPATIC FUNCTION PANEL
ALT: 12 U/L (ref 6–29)
AST: 16 U/L (ref 10–35)
Albumin: 4.6 g/dL (ref 3.6–5.1)
Alkaline Phosphatase: 60 U/L (ref 33–130)
Bilirubin, Direct: 0.1 mg/dL (ref <=0.2)
Indirect Bilirubin: 0.6 mg/dL (ref 0.2–1.2)
TOTAL PROTEIN: 7.6 g/dL (ref 6.1–8.1)
Total Bilirubin: 0.7 mg/dL (ref 0.2–1.2)

## 2015-01-15 LAB — BASIC METABOLIC PANEL WITH GFR
BUN: 16 mg/dL (ref 7–25)
CALCIUM: 10.8 mg/dL — AB (ref 8.6–10.4)
CO2: 31 mmol/L (ref 20–31)
CREATININE: 0.94 mg/dL — AB (ref 0.60–0.93)
Chloride: 100 mmol/L (ref 98–110)
GFR, Est African American: 69 mL/min (ref >=60)
GFR, Est Non African American: 60 mL/min (ref >=60)
Glucose, Bld: 99 mg/dL (ref 65–99)
Potassium: 4.9 mmol/L (ref 3.5–5.3)
Sodium: 140 mmol/L (ref 135–146)

## 2015-01-15 LAB — MAGNESIUM: MAGNESIUM: 1.9 mg/dL (ref 1.5–2.5)

## 2015-01-15 LAB — TSH: TSH: 4.847 u[IU]/mL — ABNORMAL HIGH (ref 0.350–4.500)

## 2015-01-15 LAB — CBC WITH DIFFERENTIAL/PLATELET
Basophils Absolute: 0 10*3/uL (ref 0.0–0.1)
Basophils Relative: 1 % (ref 0–1)
Eosinophils Absolute: 0.2 10*3/uL (ref 0.0–0.7)
Eosinophils Relative: 5 % (ref 0–5)
HCT: 41.4 % (ref 36.0–46.0)
Hemoglobin: 14.6 g/dL (ref 12.0–15.0)
Lymphocytes Relative: 55 % — ABNORMAL HIGH (ref 12–46)
Lymphs Abs: 2.2 10*3/uL (ref 0.7–4.0)
MCH: 28 pg (ref 26.0–34.0)
MCHC: 35.3 g/dL (ref 30.0–36.0)
MCV: 79.3 fL (ref 78.0–100.0)
MONO ABS: 0.4 10*3/uL (ref 0.1–1.0)
MPV: 9.6 fL (ref 8.6–12.4)
Monocytes Relative: 10 % (ref 3–12)
NEUTROS ABS: 1.2 10*3/uL — AB (ref 1.7–7.7)
NEUTROS PCT: 29 % — AB (ref 43–77)
Platelets: 267 10*3/uL (ref 150–400)
RBC: 5.22 MIL/uL — AB (ref 3.87–5.11)
RDW: 14.3 % (ref 11.5–15.5)
WBC: 4 10*3/uL (ref 4.0–10.5)

## 2015-01-15 LAB — LIPID PANEL
CHOLESTEROL: 214 mg/dL — AB (ref 125–200)
HDL: 72 mg/dL (ref >=46)
LDL Cholesterol: 117 mg/dL (ref <130)
Total CHOL/HDL Ratio: 3 Ratio (ref <=5.0)
Triglycerides: 125 mg/dL (ref <150)
VLDL: 25 mg/dL (ref <30)

## 2015-01-15 MED ORDER — GABAPENTIN 300 MG PO CAPS: ORAL_CAPSULE | ORAL | Status: DC

## 2015-01-15 NOTE — Patient Instructions (Signed)

## 2015-01-16 LAB — HEMOGLOBIN A1C
HEMOGLOBIN A1C: 6.8 % — AB (ref <5.7)
Mean Plasma Glucose: 148 mg/dL — ABNORMAL HIGH (ref <117)

## 2015-01-16 LAB — INSULIN, RANDOM: INSULIN: 14.3 u[IU]/mL (ref 2.0–19.6)

## 2015-01-16 LAB — VITAMIN D 25 HYDROXY (VIT D DEFICIENCY, FRACTURES): VIT D 25 HYDROXY: 78 ng/mL (ref 30–100)

## 2015-01-18 ENCOUNTER — Encounter: Payer: Self-pay | Admitting: Internal Medicine

## 2015-01-18 DIAGNOSIS — E1142 Type 2 diabetes mellitus with diabetic polyneuropathy: Secondary | ICD-10-CM | POA: Insufficient documentation

## 2015-01-18 DIAGNOSIS — E1122 Type 2 diabetes mellitus with diabetic chronic kidney disease: Secondary | ICD-10-CM | POA: Insufficient documentation

## 2015-01-18 DIAGNOSIS — G629 Polyneuropathy, unspecified: Secondary | ICD-10-CM

## 2015-01-18 DIAGNOSIS — N182 Chronic kidney disease, stage 2 (mild): Secondary | ICD-10-CM

## 2015-01-18 NOTE — Progress Notes (Addendum)
Patient ID: Rebecca Graves, female   DOB: April 03, 1940, 74 y.o.   MRN: [id number]   This very nice 74 y.o. SepBF presents for  follow up with Hypertension, Hyperlipidemia, T2 NIDDM and Vitamin D Deficiency.    Patient is treated for HTN & BP has been controlled at home. Today's BP: 134/72 mmHg. Patient has had no complaints of any cardiac type chest pain, palpitations, dyspnea/orthopnea/PND, dizziness, claudication or dependent edema.   Hyperlipidemia is controlled with diet & meds. Patient denies myalgias or other med SE's. Current  Lipids are not at goal with Total Cholesterol 214*; HDL 72; LDL 117; and Triglycerides 125.   Also, the patient has history of PreDM since 2003, but now meets criterion for T2_NIDDM and has had no symptoms of reactive hypoglycemia, diabetic polys or visual blurring, but is c/o burning paresthesias of her feet which keep her awake or have awakened her from sleep.  Current A1c is 6.8%.    Patient has been on Thyroid replacement since 1988. Further, the patient also has history of Vitamin D Deficiency of 28 in 2006 and supplements vitamin D without any suspected side-effects.  Current vitamin D is at goal with Vit D level at 78.     Medication Sig  . ALPRAZolam (XANAX) 1 MG tablet Take 1 tablet (1 mg total) by mouth at bedtime as needed. for sleep  . SUPER B COMPLEX  Take 1 tablet by mouth daily.  . bisoprolol-hctz (ZIAC) 10-6.25  TAKE 1 TABLET EVERY DAY FOR BLOOD PRESSURE  . VITAMIN D 5000 UNITS TABS Take 1 tablet by mouth daily.  Marland Kitchen VITAMIN B-12 5000 MCG SL Place 1 tablet under the tongue daily.   Marland Kitchen estradiol (ESTRACE) 0.5 MG tablet TAKE 1/2 TAB MWF AND TAKE 1 TAB THE REST OF THE WEEK   . furosemide (LASIX) 40 MG tablet TAKE 1 TAB EVERY DAY  FOR  B P  . levothyroxine  100 MCG tablet Take 1 tablet  daily before breakfast.  . LUMIGAN 0.01 % SOLN Place 1 drop into both eyes at bedtime.   . Magnesium 500 MG CAPS Take 2 capsules by mouth daily.   . meloxicam (MOBIC) 15 MG  tablet TAKE 1 TABLET EVERY DAY  AFTER  A  MEAL   . omeprazole  40 MG capsule TAKE 1 CAPSULE EVERY DAY  FOR  ACID  REFLUX  . Red Yeast Rice 600 MG CAPS Take 1 capsule by mouth daily.  Marland Kitchen rOPINIRole (REQUIP) 1 MG tablet TAKE 1 TABLET THREE TIMES DAILY AS NEEDED  FOR  RESTLESS  LEGS  . timolol (TIMOPTIC) 0.5 % ophth soln 1 drop 2 (two) times daily.  . traMADol (ULTRAM) 50 MG tablet Take 1 tablet  every 6hours as needed.   No Known Allergies  PMHx:   Past Medical History  Diagnosis Date  . Arthritis   . Glaucoma   . Hypothyroidism   . Hypertension   . Hyperlipidemia   . GERD (gastroesophageal reflux disease)   . Prediabetes   . Diverticulosis   . Colon polyps     hyperplastic  . Anal fissure   . Hemorrhoids    Immunization History  Administered Date(s) Administered  . Influenza-Unspecified 12/09/2013  . Pneumococcal Polysaccharide-23 05/29/2008  . Td 04/26/2007   Past Surgical History  Procedure Laterality Date  . Abdominal hysterectomy    . Appendectomy    . Joint replacement  2007    rt hip replacement  . Bladder suspension    .  Knee arthroscopy  03/28/2011    Procedure: ARTHROSCOPY KNEE;  Surgeon: Kerin Salen, MD;  Location: [facility];  Service: Orthopedics;  Laterality: Right;  with partial lateral meniscectomy, Debridement of Chondromalacia   FHx:    Reviewed / unchanged  SHx:    Reviewed / unchanged  Systems Review:  Constitutional: Denies fever, chills, wt changes, headaches, insomnia, fatigue, night sweats, change in appetite. Eyes: Denies redness, blurred vision, diplopia, discharge, itchy, watery eyes.  ENT: Denies discharge, congestion, post nasal drip, epistaxis, sore throat, earache, hearing loss, dental pain, tinnitus, vertigo, sinus pain, snoring.  CV: Denies chest pain, palpitations, irregular heartbeat, syncope, dyspnea, diaphoresis, orthopnea, PND, claudication or edema. Respiratory: denies cough, dyspnea, DOE, pleurisy, hoarseness,  laryngitis, wheezing.  Gastrointestinal: Denies dysphagia, odynophagia, heartburn, reflux, water brash, abdominal pain or cramps, nausea, vomiting, bloating, diarrhea, constipation, hematemesis, melena, hematochezia  or hemorrhoids. Genitourinary: Denies dysuria, frequency, urgency, nocturia, hesitancy, discharge, hematuria or flank pain. Musculoskeletal: Denies arthralgias, myalgias, stiffness, jt. swelling, pain, limping or strain/sprain.  Skin: Denies pruritus, rash, hives, warts, acne, eczema or change in skin lesion(s). Neuro: No weakness, tremor, incoordination, spasms, paresthesia or pain. Psychiatric: Denies confusion, memory loss or sensory loss. Endo: Denies change in weight, skin or hair change.  Heme/Lymph: No excessive bleeding, bruising or enlarged lymph nodes.  Physical Exam  BP 134/72 mmHg  Pulse 56  Temp(Src) 97.6 F (36.4 C)  Resp 16  Ht 5\' 8"  (1.727 m)  Wt 224 lb 9.6 oz (101.878 kg)  BMI 34.16 kg/m2  Appears well nourished and in no distress. Eyes: PERRLA, EOMs, conjunctiva no swelling or erythema. Sinuses: No frontal/maxillary tenderness ENT/Mouth: EAC's clear, TM's nl w/o erythema, bulging. Nares clear w/o erythema, swelling, exudates. Oropharynx clear without erythema or exudates. Oral hygiene is good. Tongue normal, non obstructing. Hearing intact.  Neck: Supple. Thyroid nl. Car 2+/2+ without bruits, nodes or JVD. Chest: Respirations nl with BS clear & equal w/o rales, rhonchi, wheezing or stridor.  Cor: Heart sounds normal w/ regular rate and rhythm without sig. murmurs, gallops, clicks, or rubs. Peripheral pulses normal and equal  without edema.  Abdomen: Soft & bowel sounds normal. Non-tender w/o guarding, rebound, hernias, masses, or organomegaly.  Lymphatics: Unremarkable.  Musculoskeletal: Full ROM all peripheral extremities, joint stability, 5/5 strength, and normal gait.  Skin: Warm, dry without exposed rashes, lesions or ecchymosis apparent.  Neuro:  Cranial nerves intact, reflexes equal bilaterally. Sensory-motor testing grossly intact. Tendon reflexes grossly intact.  Pysch: Alert & oriented x 3.  Insight and judgement nl & appropriate. No ideations.  Assessment and Plan:  1. Essential hypertension  - TSH  2. Hyperlipidemia  - Lipid panel  3. T2_NIDDM w/ Neuropathy  - Hemoglobin A1c - Insulin, random -Rx Gabapentin 300 mg #270 x 1rf  Take 1-3 tabs qd  4. Vitamin D deficiency  - VITAMIN D 25 Hydroxy   5. Hypothyroidism  - TSH  6. Gastroesophageal reflux disease   7. Morbid obesity, unspecified obesity type ([place])   8. BMI 33.0-33.9,adult   9. Medication management  - CBC with Differential/Platelet - BASIC METABOLIC PANEL WITH GFR - Hepatic function panel - Magnesium   Recommended regular exercise, BP monitoring, weight control, and discussed med and SE's. Recommended labs to assess and monitor clinical status. Further disposition pending results of labs. Over 30 minutes of exam, counseling, chart review was performed

## 2015-04-10 ENCOUNTER — Other Ambulatory Visit: Payer: Self-pay | Admitting: Internal Medicine

## 2015-04-10 DIAGNOSIS — K219 Gastro-esophageal reflux disease without esophagitis: Secondary | ICD-10-CM

## 2015-04-20 ENCOUNTER — Ambulatory Visit: Payer: Self-pay | Admitting: Physician Assistant

## 2015-04-21 ENCOUNTER — Ambulatory Visit (INDEPENDENT_AMBULATORY_CARE_PROVIDER_SITE_OTHER): Payer: Commercial Managed Care - HMO | Admitting: Physician Assistant

## 2015-04-21 ENCOUNTER — Encounter: Payer: Self-pay | Admitting: Physician Assistant

## 2015-04-21 VITALS — BP 120/64 | HR 60 | Temp 97.0°F | Resp 16 | Ht 68.0 in | Wt 226.4 lb

## 2015-04-21 DIAGNOSIS — E039 Hypothyroidism, unspecified: Secondary | ICD-10-CM

## 2015-04-21 DIAGNOSIS — E785 Hyperlipidemia, unspecified: Secondary | ICD-10-CM

## 2015-04-21 DIAGNOSIS — G629 Polyneuropathy, unspecified: Secondary | ICD-10-CM

## 2015-04-21 DIAGNOSIS — R6889 Other general symptoms and signs: Secondary | ICD-10-CM

## 2015-04-21 DIAGNOSIS — E1129 Type 2 diabetes mellitus with other diabetic kidney complication: Secondary | ICD-10-CM | POA: Diagnosis not present

## 2015-04-21 DIAGNOSIS — E1121 Type 2 diabetes mellitus with diabetic nephropathy: Secondary | ICD-10-CM | POA: Diagnosis not present

## 2015-04-21 DIAGNOSIS — I10 Essential (primary) hypertension: Secondary | ICD-10-CM | POA: Diagnosis not present

## 2015-04-21 DIAGNOSIS — E1140 Type 2 diabetes mellitus with diabetic neuropathy, unspecified: Secondary | ICD-10-CM | POA: Diagnosis not present

## 2015-04-21 DIAGNOSIS — Z79899 Other long term (current) drug therapy: Secondary | ICD-10-CM | POA: Diagnosis not present

## 2015-04-21 DIAGNOSIS — Z0001 Encounter for general adult medical examination with abnormal findings: Secondary | ICD-10-CM | POA: Diagnosis not present

## 2015-04-21 DIAGNOSIS — E559 Vitamin D deficiency, unspecified: Secondary | ICD-10-CM

## 2015-04-21 DIAGNOSIS — G608 Other hereditary and idiopathic neuropathies: Secondary | ICD-10-CM

## 2015-04-21 DIAGNOSIS — Z23 Encounter for immunization: Secondary | ICD-10-CM

## 2015-04-21 DIAGNOSIS — Z0000 Encounter for general adult medical examination without abnormal findings: Secondary | ICD-10-CM

## 2015-04-21 LAB — CBC WITH DIFFERENTIAL/PLATELET
Basophils Absolute: 0 10*3/uL (ref 0.0–0.1)
Basophils Relative: 1 % (ref 0–1)
EOS ABS: 0.1 10*3/uL (ref 0.0–0.7)
EOS PCT: 3 % (ref 0–5)
HCT: 39.1 % (ref 36.0–46.0)
Hemoglobin: 13.4 g/dL (ref 12.0–15.0)
LYMPHS ABS: 2.2 10*3/uL (ref 0.7–4.0)
Lymphocytes Relative: 55 % — ABNORMAL HIGH (ref 12–46)
MCH: 28.2 pg (ref 26.0–34.0)
MCHC: 34.3 g/dL (ref 30.0–36.0)
MCV: 82.1 fL (ref 78.0–100.0)
MPV: 9.7 fL (ref 8.6–12.4)
Monocytes Absolute: 0.3 10*3/uL (ref 0.1–1.0)
Monocytes Relative: 8 % (ref 3–12)
Neutro Abs: 1.3 10*3/uL — ABNORMAL LOW (ref 1.7–7.7)
Neutrophils Relative %: 33 % — ABNORMAL LOW (ref 43–77)
Platelets: 215 10*3/uL (ref 150–400)
RBC: 4.76 MIL/uL (ref 3.87–5.11)
RDW: 14 % (ref 11.5–15.5)
WBC: 4 10*3/uL (ref 4.0–10.5)

## 2015-04-21 NOTE — Progress Notes (Signed)
MEDICARE ANNUAL WELLNESS VISIT AND FOLLOW UP  Assessment:   1. Essential hypertension - continue medications, DASH diet, exercise and monitor at home. Call if greater than 130/80.  - CBC with Differential/Platelet - BASIC METABOLIC PANEL WITH GFR - Hepatic function panel - TSH  2. Controlled type 2 diabetes mellitus with diabetic nephropathy, without long-term current use of insulin ([place]) Discussed general issues about diabetes pathophysiology and management., Educational material distributed., Suggested low cholesterol diet., Encouraged aerobic exercise., Discussed foot care., Reminded to get yearly retinal exam. - BASIC METABOLIC PANEL WITH GFR - Hemoglobin A1c  3. Type 2 diabetes mellitus with diabetic neuropathy, without long-term current use of insulin ([place]) Discussed general issues about diabetes pathophysiology and management., Educational material distributed., Suggested low cholesterol diet., Encouraged aerobic exercise., Discussed foot care., Reminded to get yearly retinal exam. ADD ASA DECLINE ACE/ARB AT THIS TIME - Hemoglobin A1c  4. Hypothyroidism, unspecified hypothyroidism type - TSH  5. Hyperlipidemia - Lipid panel  6. Peripheral sensory neuropathy (HCC) Check feet daily  7. Vitamin D deficiency - VITAMIN D 25 Hydroxy (Vit-D Deficiency, Fractures)  8. Morbid obesity, unspecified obesity type ([place]) Obesity with co morbidities- long discussion about weight loss, diet, and exercise  9. Medication management - Magnesium  10. Need for prophylactic vaccination against Streptococcus pneumoniae (pneumococcus) - Pneumococcal conjugate vaccine 13-valent IM  11. Encounter for Medicare annual wellness exam   Plan:   During the course of the visit the patient was educated and counseled about appropriate screening and preventive services including:    Pneumococcal vaccine   Influenza vaccine  Td vaccine  Screening electrocardiogram  Screening  mammography  Bone densitometry screening  Colorectal cancer screening  Diabetes screening  Glaucoma screening  Nutrition counseling   Advanced directives: given info/requested  Conditions/risks identified: BMI: Discussed weight loss, diet, and increase physical activity.  Increase physical activity: AHA recommends 150 minutes of physical activity a week.  Medications reviewed DEXA- ordered Prediabetes is at goal Urinary Incontinence is not an issue: discussed non pharmacology and pharmacology options.  Fall risk: low- discussed PT, home fall assessment, medications.    Subjective:   Rebecca Graves is a 75 y.o. female who presents for Medicare Annual Wellness Visit and 3 month follow up on hypertension, prediabetes, hyperlipidemia, vitamin D def.  Date of last medicare wellness visit was 2015  Her blood pressure has been controlled at home, today their BP is BP: 120/64 mmHg She does workout. She denies chest pain, shortness of breath, dizziness.  She is not on cholesterol medication and denies myalgias. Her cholesterol is at goal. The cholesterol last visit was:   Lab Results  Component Value Date   CHOL 214* 01/15/2015   HDL 72 01/15/2015   LDLCALC 117 01/15/2015   TRIG 125 01/15/2015   CHOLHDL 3.0 01/15/2015   She has been working on diet and exercise for prediabetes, last visit she was in the DM range, admits to drinking soda, she is not on ASA and she is not on ACE, and denies paresthesia of the feet, polydipsia and polyuria. Last A1C in the office was:  Lab Results  Component Value Date   HGBA1C 6.8* 01/15/2015   Patient is on Vitamin D supplement. Lab Results  Component Value Date   VD25OH 78 01/15/2015     She is on thyroid medication. Her medication was changed last visit, on 1.5 pill on M,W,F and 1 pill the other 4 days. Patient denies nervousness, palpitations and weight changes.  Lab Results  Component Value Date   TSH 4.847* 01/15/2015  .  She is on  estrace and she is NOT on bASA, normal MGM in May.   Names of Other Physician/Practitioners you currently use: 1. [facility]- here for primary care 2. Dr. Peter Garter, eye doctor,has appointment coming up, visits every 3 months for glacoma 3. Can't remember name, dentist, last visit 2 years ago Patient Care Team: Unk Pinto, MD as PCP - General (Internal Medicine) Camillo Flaming, OD as Referring Physician (Optometry) Suella Broad, MD as Consulting Physician (Physical Medicine and Rehabilitation) Ladene Artist, MD as Consulting Physician (Gastroenterology) Myrlene Broker, MD as Attending Physician (Urology)  Medication Review Current Outpatient Prescriptions on File Prior to Visit  Medication Sig Dispense Refill  . ALPRAZolam (XANAX) 1 MG tablet Take 1 tablet (1 mg total) by mouth at bedtime as needed. for sleep 90 tablet 1  . B Complex-C (SUPER B COMPLEX PO) Take 1 tablet by mouth daily.    . bisoprolol-hydrochlorothiazide (ZIAC) 10-6.25 MG per tablet TAKE 1 TABLET EVERY DAY FOR BLOOD PRESSURE 90 tablet 1  . Cholecalciferol (VITAMIN D-3) 5000 UNITS TABS Take 1 tablet by mouth daily.    . Cyanocobalamin (VITAMIN B-12) 5000 MCG SUBL Place 1 tablet under the tongue daily.     Marland Kitchen estradiol (ESTRACE) 0.5 MG tablet TAKE 1/2 TABLET ON MONDAY, WEDNESDAY AND FRIDAY AND TAKE 1 TABLET THE REST OF THE WEEK (NEED TO WEAN DOWN) 72 tablet 1  . FLUZONE HIGH-DOSE 0.5 ML SUSY ADMINISTERED BY PHARMACIST  0  . furosemide (LASIX) 40 MG tablet TAKE 1 TABLET EVERY DAY  FOR  BLOOD  PRESSURE/FLUID 90 tablet 1  . gabapentin (NEURONTIN) 300 MG capsule Take 1 capsule 3 x day for neuropathy pain 270 capsule 1  . levothyroxine (SYNTHROID, LEVOTHROID) 100 MCG tablet Take 1 tablet (100 mcg total) by mouth daily before breakfast. 90 tablet 2  . LUMIGAN 0.01 % SOLN Place 1 drop into both eyes at bedtime.     . Magnesium 500 MG CAPS Take 2 capsules by mouth daily.     . meloxicam  (MOBIC) 15 MG tablet TAKE 1 TABLET EVERY DAY  AFTER  A  MEAL  FOR  ARTHRITIS  PAIN -CONTACT DR OFFICE FOR APPOINTMENT 90 tablet 3  . omeprazole (PRILOSEC) 40 MG capsule TAKE 1 CAPSULE EVERY DAY  FOR  ACID  REFLUX 90 capsule 1  . Red Yeast Rice 600 MG CAPS Take 1 capsule by mouth daily.    Marland Kitchen rOPINIRole (REQUIP) 1 MG tablet TAKE 1 TABLET THREE TIMES DAILY AS NEEDED  FOR  RESTLESS  LEGS 270 tablet 3  . timolol (TIMOPTIC) 0.5 % ophthalmic solution 1 drop 2 (two) times daily.    . traMADol (ULTRAM) 50 MG tablet Take 1 tablet (50 mg total) by mouth every 6 (six) hours as needed. 360 tablet 1   No current facility-administered medications on file prior to visit.    Current Problems (verified) Patient Active Problem List   Diagnosis Date Noted  . T2_DM w/ Periph Sensory Neuropathy 01/18/2015  . T2_DM w/CKD 2 (GFR 69 ml/min)  (HCC) 01/18/2015  . BMI 33.0-33.9,adult 01/15/2015  . Morbid obesity (BMI 33.40) 07/03/2014  . Peripheral sensory neuropathy ([place]) 07/03/2014  . Internal hemorrhoids 12/19/2013  . Vitamin D deficiency 03/31/2013  . Medication management 03/31/2013  . DJD   . Glaucoma   . Hypothyroidism   . Hypertension   . Hyperlipidemia   . GERD (gastroesophageal  reflux disease)   . Tear of lateral meniscus of knee, current 03/27/2011    Screening Tests Health Maintenance  Topic Date Due  . OPHTHALMOLOGY EXAM  12/10/1950  . ZOSTAVAX  12/09/2000  . DEXA SCAN  12/09/2005  . PNA vac Low Risk Adult (2 of 2 - PCV13) 05/29/2009  . INFLUENZA VACCINE  09/29/2014  . FOOT EXAM  10/04/2014  . URINE MICROALBUMIN  07/03/2015  . HEMOGLOBIN A1C  07/15/2015  . MAMMOGRAM  07/14/2016  . TETANUS/TDAP  04/25/2017  . COLONOSCOPY  11/29/2017     Immunization History  Administered Date(s) Administered  . Influenza-Unspecified 12/09/2013  . Pneumococcal Polysaccharide-23 05/29/2008  . Td 04/26/2007    Preventative care: Last colonoscopy: Dr. Fuller Plan on 11/2012 due 2019 Last mammogram:  06/2014 Last pap smear/pelvic exam: remote, declines DEXA: never, agrees to get with MGM US carotid 2015  Prior vaccinations: TD or Tdap: 2009  Influenza: 2016 Pneumococcal: 2010 Prevnar 13: DUE Shingles/Zostavax: declines due to price  Allergies No Known Allergies Surgical history Past Surgical History  Procedure Laterality Date  . Abdominal hysterectomy    . Appendectomy    . Joint replacement  2007    rt hip replacement  . Bladder suspension    . Knee arthroscopy  03/28/2011    Procedure: ARTHROSCOPY KNEE;  Surgeon: Kerin Salen, MD;  Location: [facility];  Service: Orthopedics;  Laterality: Right;  with partial lateral meniscectomy, Debridement of Chondromalacia   Family history Family History  Problem Relation Age of Onset  . Colon cancer Neg Hx   . Rectal cancer Neg Hx   . Stomach cancer Neg Hx   . Esophageal cancer Other     nefew- age 1   Tobacco Social History  Substance Use Topics  . Smoking status: Former Smoker    Quit date: 03/22/1996  . Smokeless tobacco: Never Used  . Alcohol Use: 1.0 oz/week    2 drink(s) per week     Comment: occ   She does not smoke.  Patient is a former smoker. Are there smokers in your home (other than you)?  No  Alcohol Current alcohol use: social drinker  Caffeine Current caffeine use: coffee 2 /day  Exercise Current exercise: swimming and walking  Nutrition/Diet Current diet: in general, a "healthy" diet    Cardiac risk factors: advanced age (older than 80 for men, 69 for women), dyslipidemia, hypertension and obesity (BMI >= 30 kg/m2).  Depression Screen (Note: if answer to either of the following is "Yes", a more complete depression screening is indicated)   Q1: Over the past two weeks, have you felt down, depressed or hopeless? No  Q2: Over the past two weeks, have you felt little interest or pleasure in doing things? No  Have you lost interest or pleasure in daily life? No  Do you often feel  hopeless? No  Do you cry easily over simple problems? No  Activities of Daily Living In your present state of health, do you have any difficulty performing the following activities?:  Driving? No Managing money?  No Feeding yourself? No Getting from bed to chair? No Climbing a flight of stairs? No Preparing food and eating?: No Bathing or showering? No Getting dressed: No Getting to the toilet? No Using the toilet:No Moving around from place to place: No In the past year have you fallen or had a near fall?:Yes x 1 no injury  Vision Difficulties: Yes  Hearing Difficulties: No Do you often ask people to  speak up or repeat themselves? No Do you experience ringing or noises in your ears? No Do you have difficulty understanding soft or whispered voices? No  Cognition  Do you feel that you have a problem with memory?No  Do you often misplace items? No  Do you feel safe at home?  Yes  Advanced directives Does patient have a [place]? Yes Does patient have a Living Will? Yes   Objective:   Blood pressure 120/64, pulse 60, temperature 97 F (36.1 C), resp. rate 16, height 5\' 8"  (1.727 m), weight 226 lb 6.4 oz (102.694 kg), SpO2 97 %. Body mass index is 34.43 kg/(m^2).  General appearance: alert, no distress, WD/WN,  female Cognitive Testing  Alert? Yes  Normal Appearance?Yes  Oriented to person? Yes  Place? Yes   Time? Yes  Recall of three objects?  Yes  Can perform simple calculations? Yes  Displays appropriate judgment?Yes  Can read the correct time from a watch face?Yes  HEENT: normocephalic, sclerae anicteric, TMs pearly, nares patent, no discharge or erythema, pharynx normal Oral cavity: MMM, no lesions Neck: supple, no lymphadenopathy, no thyromegaly, no masses Heart: RRR, normal S1, S2, no murmurs Lungs: CTA bilaterally, no wheezes, rhonchi, or rales Abdomen: +bs, soft, non tender, non distended, no masses, no hepatomegaly, no  splenomegaly Musculoskeletal: nontender, no swelling, no obvious deformity Extremities: no edema, no cyanosis, no clubbing Pulses: 2+ symmetric, upper and lower extremities, normal cap refill Neurological: alert, oriented x 3, CN2-12 intact, strength normal upper extremities and lower extremities, sensation normal throughout, DTRs 2+ throughout, no cerebellar signs, gait normal Psychiatric: normal affect, behavior normal, pleasant  Breast: defer Gyn: defer Rectal: defer  Medicare Attestation I have personally reviewed: The patient's medical and social history Their use of alcohol, tobacco or illicit drugs Their current medications and supplements The patient's functional ability including ADLs,fall risks, home safety risks, cognitive, and hearing and visual impairment Diet and physical activities Evidence for depression or mood disorders  The patient's weight, height, BMI, and visual acuity have been recorded in the chart.  I have made referrals, counseling, and provided education to the patient based on review of the above and I have provided the patient with a written personalized care plan for preventive services.     Vicie Mutters, PA-C   04/21/2015

## 2015-04-21 NOTE — Patient Instructions (Addendum)
Can try melatonin 5-15 mg at night for sleep 30 mins before bed  Add ENTERIC COATED low dose 81 mg Aspirin daily OR can do every other day if you have easy bruising to protect your heart and head. As well as to reduce risk of Colon Cancer by 20 %, Skin Cancer by 26 % , Melanoma by 46% and Pancreatic cancer by 60%  Diabetes is a very complicated disease...lets simplify it.  An easy way to look at it to understand the complications is if you think of the extra sugar floating in your blood stream as glass shards floating through your blood stream.    Diabetes affects your small vessels first: 1) The glass shards (sugar) scraps down the tiny blood vessels in your eyes and lead to diabetic retinopathy, the leading cause of blindness in the [place]. Diabetes is the leading cause of newly diagnosed adult (50 to 75 years of age) blindness in the [place].  2) The glass shards scratches down the tiny vessels of your legs leading to nerve damage called neuropathy and can lead to amputations of your feet. More than 60% of all non-traumatic amputations of lower limbs occur in people with diabetes.  3) Over time the small vessels in your brain are shredded and closed off, individually this does not cause any problems but over a long period of time many of the small vessels being blocked can lead to Vascular Dementia.   4) Your kidney's are a filter system and have a "net" that keeps certain things in the body and lets bad things out. Sugar shreds this net and leads to kidney damage and eventually failure. Decreasing the sugar that is destroying the net and certain blood pressure medications can help stop or decrease progression of kidney disease. Diabetes was the primary cause of kidney failure in 44 percent of all new cases in 2011.  5) Diabetes also destroys the small vessels in your penis that lead to erectile dysfunction. Eventually the vessels are so damaged that you may not be responsive to cialis or  viagra.   Diabetes and your large vessels: Your larger vessels consist of your coronary arteries in your heart and the carotid vessels to your brain. Diabetes or even increased sugars put you at 300% increased risk of heart attack and stroke and this is why.. The sugar scrapes down your large blood vessels and your body sees this as an internal injury and tries to repair itself. Just like you get a scab on your skin, your platelets will stick to the blood vessel wall trying to heal it. This is why we have diabetics on low dose aspirin daily, this prevents the platelets from sticking and can prevent plaque formation. In addition, your body takes cholesterol and tries to shove it into the open wound. This is why we want your LDL, or bad cholesterol, below 70.   The combination of platelets and cholesterol over 5-10 years forms plaque that can break off and cause a heart attack or stroke.   PLEASE REMEMBER:  Diabetes is preventable! Up to 12 percent of complications and morbidities among individuals with type 2 diabetes can be prevented, delayed, or effectively treated and minimized with regular visits to a health professional, appropriate monitoring and medication, and a healthy diet and lifestyle.     Bad carbs also include fruit juice, alcohol, and sweet tea. These are empty calories that do not signal to your brain that you are full.   Please remember the  good carbs are still carbs which convert into sugar. So please measure them out no more than 1/2-1 cup of rice, oatmeal, pasta, and beans  Veggies are however free foods! Pile them on.   Not all fruit is created equal. Please see the list below, the fruit at the bottom is higher in sugars than the fruit at the top. Please avoid all dried fruits.

## 2015-04-22 LAB — LIPID PANEL
CHOLESTEROL: 187 mg/dL (ref 125–200)
HDL: 84 mg/dL (ref >=46)
LDL Cholesterol: 75 mg/dL (ref <130)
TRIGLYCERIDES: 141 mg/dL (ref <150)
Total CHOL/HDL Ratio: 2.2 Ratio (ref <=5.0)
VLDL: 28 mg/dL (ref <30)

## 2015-04-22 LAB — HEPATIC FUNCTION PANEL
ALBUMIN: 4.4 g/dL (ref 3.6–5.1)
ALT: 13 U/L (ref 6–29)
AST: 16 U/L (ref 10–35)
Alkaline Phosphatase: 53 U/L (ref 33–130)
Bilirubin, Direct: 0.2 mg/dL (ref <=0.2)
Indirect Bilirubin: 0.7 mg/dL (ref 0.2–1.2)
TOTAL PROTEIN: 7.3 g/dL (ref 6.1–8.1)
Total Bilirubin: 0.9 mg/dL (ref 0.2–1.2)

## 2015-04-22 LAB — BASIC METABOLIC PANEL WITH GFR
BUN: 14 mg/dL (ref 7–25)
CALCIUM: 10.3 mg/dL (ref 8.6–10.4)
CO2: 31 mmol/L (ref 20–31)
Chloride: 99 mmol/L (ref 98–110)
Creat: 1.03 mg/dL — ABNORMAL HIGH (ref 0.60–0.93)
GFR, EST AFRICAN AMERICAN: 62 mL/min (ref >=60)
GFR, Est Non African American: 54 mL/min — ABNORMAL LOW (ref >=60)
Glucose, Bld: 112 mg/dL — ABNORMAL HIGH (ref 65–99)
Potassium: 3.9 mmol/L (ref 3.5–5.3)
Sodium: 143 mmol/L (ref 135–146)

## 2015-04-22 LAB — TSH: TSH: 0.19 m[IU]/L — AB

## 2015-04-22 LAB — VITAMIN D 25 HYDROXY (VIT D DEFICIENCY, FRACTURES): VIT D 25 HYDROXY: 60 ng/mL (ref 30–100)

## 2015-04-22 LAB — MAGNESIUM: MAGNESIUM: 1.7 mg/dL (ref 1.5–2.5)

## 2015-04-22 LAB — HEMOGLOBIN A1C
Hgb A1c MFr Bld: 6.5 % — ABNORMAL HIGH (ref <5.7)
Mean Plasma Glucose: 140 mg/dL — ABNORMAL HIGH (ref <117)

## 2015-04-24 DIAGNOSIS — H401132 Primary open-angle glaucoma, bilateral, moderate stage: Secondary | ICD-10-CM | POA: Diagnosis not present

## 2015-05-13 ENCOUNTER — Other Ambulatory Visit: Payer: Self-pay | Admitting: Physician Assistant

## 2015-05-18 ENCOUNTER — Other Ambulatory Visit: Payer: Self-pay | Admitting: Internal Medicine

## 2015-05-19 ENCOUNTER — Ambulatory Visit (INDEPENDENT_AMBULATORY_CARE_PROVIDER_SITE_OTHER): Payer: Commercial Managed Care - HMO | Admitting: *Deleted

## 2015-05-19 DIAGNOSIS — E039 Hypothyroidism, unspecified: Secondary | ICD-10-CM | POA: Diagnosis not present

## 2015-05-19 LAB — TSH: TSH: 1.67 m[IU]/L

## 2015-05-19 NOTE — Progress Notes (Signed)
Patient ID: Rebecca Graves, female   DOB: [birth year], 75 y.o.   MRN: [id number] Patient is here for a NV to recheck her TSH.  She is taking Levothyroxine 100 mcg 1 tablet 6 days a week and 1/2 tablet on Fridays.  The lab result instructions were for 1.5 tablets on Fridays.

## 2015-06-04 ENCOUNTER — Other Ambulatory Visit: Payer: Self-pay | Admitting: Internal Medicine

## 2015-07-02 ENCOUNTER — Telehealth: Payer: Self-pay | Admitting: Internal Medicine

## 2015-07-02 DIAGNOSIS — Z1231 Encounter for screening mammogram for malignant neoplasm of breast: Secondary | ICD-10-CM

## 2015-07-02 DIAGNOSIS — E2839 Other primary ovarian failure: Secondary | ICD-10-CM

## 2015-07-02 NOTE — Addendum Note (Signed)
Addended by: Vicie Mutters R on: 07/02/2015 02:43 PM   Modules accepted: Orders

## 2015-07-02 NOTE — Telephone Encounter (Signed)
Patient requesting Mammo and DEXA, [place], 1st DEXA.

## 2015-07-15 ENCOUNTER — Encounter: Payer: Self-pay | Admitting: Internal Medicine

## 2015-07-17 DIAGNOSIS — H401132 Primary open-angle glaucoma, bilateral, moderate stage: Secondary | ICD-10-CM | POA: Diagnosis not present

## 2015-07-17 DIAGNOSIS — H3562 Retinal hemorrhage, left eye: Secondary | ICD-10-CM | POA: Diagnosis not present

## 2015-07-17 DIAGNOSIS — H3582 Retinal ischemia: Secondary | ICD-10-CM | POA: Diagnosis not present

## 2015-07-17 DIAGNOSIS — H35032 Hypertensive retinopathy, left eye: Secondary | ICD-10-CM | POA: Diagnosis not present

## 2015-07-20 ENCOUNTER — Other Ambulatory Visit: Payer: Commercial Managed Care - HMO

## 2015-07-20 ENCOUNTER — Ambulatory Visit: Payer: Commercial Managed Care - HMO

## 2015-07-20 DIAGNOSIS — H348122 Central retinal vein occlusion, left eye, stable: Secondary | ICD-10-CM | POA: Diagnosis not present

## 2015-07-21 ENCOUNTER — Encounter: Payer: Self-pay | Admitting: Internal Medicine

## 2015-07-21 ENCOUNTER — Ambulatory Visit (INDEPENDENT_AMBULATORY_CARE_PROVIDER_SITE_OTHER): Payer: Commercial Managed Care - HMO | Admitting: Internal Medicine

## 2015-07-21 VITALS — BP 130/80 | HR 55 | Resp 16 | Ht 68.0 in | Wt 223.8 lb

## 2015-07-21 DIAGNOSIS — M353 Polymyalgia rheumatica: Secondary | ICD-10-CM | POA: Diagnosis not present

## 2015-07-21 DIAGNOSIS — H34812 Central retinal vein occlusion, left eye: Secondary | ICD-10-CM

## 2015-07-21 DIAGNOSIS — H348132 Central retinal vein occlusion, bilateral, stable: Secondary | ICD-10-CM | POA: Diagnosis not present

## 2015-07-21 NOTE — Patient Instructions (Addendum)
Recommend increase your Low dose Aspirin 81 mg from 1 tablet to 2 tablets/day  ++++++++++++++++++++++++++++++++++++++++ Central Retinal Vein Occlusion Central retinal vein occlusion (CRVO) is a blockage in the main blood vessel that carries blood away from the retina. The retina is the layer of nerve cells in the back of the eye that senses light and sends signals to the brain for vision. When you have CRVO, a blood clot forms inside your central retinal vein. This can happen suddenly or gradually. The clot causes blood and fluid to leak out of the central retinal vein. The blood and fluid collect in the central area of your retina (macula). This causes vision loss or blurred vision in the affected eye. The blurriness or loss of vision may be complete or partial. Partial loss may get worse over hours or days. CAUSES This condition is caused by a blood clot that forms inside the central retinal vein. RISK FACTORS The main risk factor for this condition is having a blood vessel disease that causes narrowing of the arteries (atherosclerosis). Atherosclerosis can slow blood flow and contribute to clot formation. CRVO is also more likely to develop in:  People who are age 61 or older.  People who have any of the following medical conditions:  High blood pressure.  Diabetes.  Increased pressure inside the eye (glaucoma).  Increased blood clotting. SYMPTOMS The main symptom of this condition is a sudden and painless vision loss or blurring in one eye. Other symptoms include:  Tiny spots or clumps that move across your vision (floaters).  Pain or pressure in your eye. This occurs in very bad cases. DIAGNOSIS This condition is usually diagnosed by a health care provider who specializes in eye diseases (ophthalmologist). The ophthalmologist will do a complete eye exam. You may have tests, such as:  A vision test.  An exam to check your retina after getting eye drops to open the pupil  (slit-lamp exam).  An imaging study of your retinal blood flow after dye has been injected into your bloodstream (fluorescein angiogram).  A measurement of the pressure inside your eye. You may also have tests to help find the cause of CRVO and to rule out other conditions. These may include blood tests to check for:  Diabetes.  High levels of fat or cholesterol in your blood.  Abnormal blood clotting. TREATMENT There is no cure for CRVO. Retinal damage cannot be repaired. Some vision may come back over time, but it rarely returns to normal. Treatment aims to help with complications of CRVO and to prevent future episodes. This may include:  Medicines that are injected into your eye to seal off leaking blood vessels and to prevent further vision loss.  Laser eye surgery to reduce fluid leaking into your macula.  Laser eye surgery to keep new blood vessels from growing inside your eye. Those blood vessels can make your vision worse and increase your risk for glaucoma. HOME CARE INSTRUCTIONS  Take medicines only as directed by your health care provider.  Keep all follow-up visits as directed by your health care provider. This is important. PREVENTION You may be able to prevent this condition if you follow these instructions:  Work with your health care providers to reduce your risk factors.  Do not use any tobacco products, including cigarettes, chewing tobacco, or electronic cigarettes. If you need help quitting, ask your health care provider.  Maintain a healthy weight.  Exercise regularly. SEEK MEDICAL CARE IF:  You have any  changes in your vision. SEEK IMMEDIATE MEDICAL CARE IF:  You have a sudden loss of vision. +++++++++++++++++++++++++++++++++++++++++++++++++++++++++++++++++++++++++++++++++ Carotid Artery Disease The carotid arteries are the two main arteries on either side of the neck that supply blood to the brain. Carotid artery disease, also called carotid artery  stenosis, is the narrowing or blockage of one or both carotid arteries. Carotid artery disease increases your risk for a stroke or a transient ischemic attack (TIA). A TIA is an episode in which a waxy, fatty substance that accumulates within the artery (plaque) blocks blood flow to the brain. A TIA is considered a "warning stroke."  CAUSES   Buildup of plaque inside the carotid arteries (atherosclerosis) (common).  A weakened outpouching in an artery (aneurysm).  Inflammation of the carotid artery (arteritis).  A fibrous growth within the carotid artery (fibromuscular dysplasia).  Tissue death within the carotid artery due to radiation treatment (post-radiation necrosis).  Decreased blood flow due to spasms of the carotid artery (vasospasm).  Separation of the walls of the carotid artery (carotid dissection). RISK FACTORS  High cholesterol (dyslipidemia).   High blood pressure (hypertension).   Smoking.   Obesity.   Diabetes.   Family history of cardiovascular disease.   Inactivity or lack of regular exercise.   Being female. Men have an increased risk of developing atherosclerosis earlier in life than women.  SYMPTOMS  Carotid artery disease does not cause symptoms. DIAGNOSIS Diagnosis of carotid artery disease may include:   A physical exam. Your health care provider may hear an abnormal sound (bruit) when listening to the carotid arteries.   Specific tests that look at the blood flow in the carotid arteries. These tests include:   Carotid artery ultrasonography.   Carotid or cerebral angiography.   Computerized tomographic angiography (CTA).   Magnetic resonance angiography (MRA).  TREATMENT  Treatment of carotid artery disease can include a combination of treatments. Treatment options include:  Surgery. You may have:   A carotid endarterectomy. This is a surgery to remove the blockages in the carotid arteries.   A carotid angioplasty with  stenting. This is a nonsurgical interventional procedure. A wire mesh (stent) is used to widen the blocked carotid arteries.   Medicines to control blood pressure, cholesterol, and reduce blood clotting (antiplatelet therapy).   Adjusting your diet.   Lifestyle changes such as:   Quitting smoking.   Exercising as tolerated or as directed by your health care provider.   Controlling and maintaining a good blood pressure.   Keeping cholesterol levels under control.  HOME CARE INSTRUCTIONS   Take medicines only as directed by your health care provider. Make sure you understand all your medicine instructions. Do not stop your medicines without talking to your health care provider.   Follow your health care provider's diet instructions. It is important to eat a healthy diet that is low in saturated fats and includes plenty of fresh fruits, vegetables, and lean meats. High-fat, high-sodium foods as well as foods that are fried, overly processed, or have poor nutritional value should be avoided.  Maintain a healthy weight.   Stay physically active. It is recommended that you get at least 30 minutes of activity every day.   Do not use any tobacco products including cigarettes, chewing tobacco, or electronic cigarettes. If you need help quitting, ask your health care provider.  Limit alcohol use to:   No more than 2 drinks per day for men.   No more than 1 drink per day for nonpregnant  women.   Do not use illegal drugs.   Keep all follow-up visits as directed by your health care provider.  SEEK IMMEDIATE MEDICAL CARE IF:  You develop TIA or stroke symptoms. These include:   Sudden weakness or numbness on one side of the body, such as in the face, arm, or leg.   Sudden confusion.   Trouble speaking (aphasia) or understanding.   Sudden trouble seeing out of one or both eyes.   Sudden trouble walking.   Dizziness or feeling like you might faint.   Loss of  balance or coordination.   Sudden severe headache with no known cause.   Sudden trouble swallowing (dysphagia).  If you have any of these symptoms, call your local emergency services (911 in [place] Do not drive yourself to the clinic or hospital. This is a medical emergency.

## 2015-07-22 ENCOUNTER — Encounter: Payer: Self-pay | Admitting: Internal Medicine

## 2015-07-22 LAB — SEDIMENTATION RATE: Sed Rate: 4 mm/hr (ref 0–30)

## 2015-07-22 NOTE — Progress Notes (Signed)
Subjective:    Patient ID: Rebecca Graves, female    DOB: 1941/02/18, 75 y.o.   MRN: [id number]  HPI   Patient presents by referral from Dr Tama High at the [facility] on [address] for conjoint evaluation with apparent recent dx of suspected L Central Retinal Vein Occlusion. Patient denies any visual sx's as changes of retinal hemorrhages were initially noted by her Optometrist Dr Peter Garter who referred her to Dr Manuella Ghazi.   Medication Sig  . ALPRAZolam (XANAX) 1 MG tablet Take 1 tablet (1 mg total) by mouth at bedtime as needed. for sleep  . B Complex-C (SUPER B COMPLEX PO) Take 1 tablet by mouth daily.  . bisoprolol-hydrochlorothiazide (ZIAC) 10-6.25 MG tablet TAKE 1 TABLET EVERY DAY FOR BLOOD PRESSURE  . Cholecalciferol (VITAMIN D-3) 5000 UNITS TABS Take 1 tablet by mouth daily.  Marland Kitchen estradiol (ESTRACE) 0.5 MG tablet TAKE 1/2 TABLET ON MONDAY, WEDNESDAY AND FRIDAY AND TAKE 1 TABLET THE REST OF THE WEEK (NEED TO WEAN DOWN)  . FLUZONE HIGH-DOSE 0.5 ML SUSY ADMINISTERED BY PHARMACIST  . furosemide (LASIX) 40 MG tablet TAKE 1 TABLET EVERY DAY  FOR  BLOOD  PRESSURE/FLUID  . gabapentin (NEURONTIN) 300 MG capsule TAKE 1 CAPSULE THREE TIMES DAILY  FOR  NEUROPATHY  PAIN  . levothyroxine (SYNTHROID, LEVOTHROID) 100 MCG tablet TAKE 1 TABLET EVERY DAY  BEFORE  BREAKFAST  . LUMIGAN 0.01 % SOLN Place 1 drop into both eyes at bedtime.   . Magnesium 500 MG CAPS Take 2 capsules by mouth daily.   . meloxicam (MOBIC) 15 MG tablet TAKE 1 TABLET EVERY DAY  AFTER  A  MEAL  FOR  ARTHRITIS  PAIN -CONTACT DR OFFICE FOR APPOINTMENT  . omeprazole (PRILOSEC) 40 MG capsule TAKE 1 CAPSULE EVERY DAY  FOR  ACID  REFLUX  . Red Yeast Rice 600 MG CAPS Take 1 capsule by mouth daily.  Marland Kitchen rOPINIRole (REQUIP) 1 MG tablet TAKE 1 TABLET THREE TIMES DAILY AS NEEDED  FOR  RESTLESS  LEGS  . timolol (TIMOPTIC) 0.5 % ophthalmic solution 1 drop 2 (two) times daily.  . traMADol (ULTRAM) 50 MG tablet Take 1 tablet (50 mg total) by  mouth every 6 (six) hours as needed.  . Cyanocobalamin (VITAMIN B-12) 5000 MCG SUBL Place 1 tablet under the tongue daily. Reported on 07/21/2015   No Known Allergies   Past Medical History  Diagnosis Date  . Arthritis   . Glaucoma   . Hypothyroidism   . Hypertension   . Hyperlipidemia   . GERD (gastroesophageal reflux disease)   . Prediabetes   . Diverticulosis   . Colon polyps     hyperplastic  . Anal fissure   . Hemorrhoids    Review of Systems  10 point systems review negative except as above.    Objective:   Physical Exam  BP 130/80 mmHg  Pulse 55  Resp 16  Ht 5\' 8"  (1.727 m)  Wt 223 lb 12.8 oz (101.515 kg)  BMI 34.04 kg/m2  SpO2 96%  HEENT - Eac's patent. TM's Nl. EOM's full. PERRLA.Noted mild Cataracts ou. Few small H & E in the Left temporal fundus and a & of mild papilledema on the left. NasoOroPharynx clear. Neck - supple. Nl Thyroid. Carotids 2+ & No bruits, nodes, JVD Chest - Clear. Cor - Nl HS. RRR w/o sig MGR.   MS- FROM w/o deformities. Gait Nl. Neuro - No obvious Cr N abnormalities. Sensory, motor and Cerebellar functions appear Nl  w/o focal abnormalities.     Assessment & Plan:   1. Retinal vein occlusion, central, left  - VAS US CAROTID; Future - Sedimentation rate

## 2015-07-29 ENCOUNTER — Other Ambulatory Visit: Payer: Self-pay | Admitting: Internal Medicine

## 2015-08-03 ENCOUNTER — Other Ambulatory Visit: Payer: Self-pay | Admitting: Internal Medicine

## 2015-08-19 ENCOUNTER — Other Ambulatory Visit: Payer: Self-pay | Admitting: Internal Medicine

## 2015-08-19 ENCOUNTER — Ambulatory Visit (HOSPITAL_COMMUNITY)
Admission: RE | Admit: 2015-08-19 | Discharge: 2015-08-19 | Disposition: A | Discharge status: Home / Self Care | Payer: Commercial Managed Care - HMO | Source: Ambulatory Visit | Attending: Internal Medicine | Admitting: Internal Medicine

## 2015-08-19 DIAGNOSIS — I6523 Occlusion and stenosis of bilateral carotid arteries: Secondary | ICD-10-CM | POA: Diagnosis not present

## 2015-08-19 DIAGNOSIS — H348122 Central retinal vein occlusion, left eye, stable: Secondary | ICD-10-CM | POA: Insufficient documentation

## 2015-08-19 DIAGNOSIS — I10 Essential (primary) hypertension: Secondary | ICD-10-CM | POA: Diagnosis not present

## 2015-08-19 DIAGNOSIS — R7303 Prediabetes: Secondary | ICD-10-CM | POA: Diagnosis not present

## 2015-08-19 DIAGNOSIS — K219 Gastro-esophageal reflux disease without esophagitis: Secondary | ICD-10-CM | POA: Diagnosis not present

## 2015-08-19 DIAGNOSIS — H34812 Central retinal vein occlusion, left eye: Secondary | ICD-10-CM

## 2015-08-19 DIAGNOSIS — E785 Hyperlipidemia, unspecified: Secondary | ICD-10-CM | POA: Diagnosis not present

## 2015-08-21 ENCOUNTER — Ambulatory Visit
Admission: RE | Admit: 2015-08-21 | Discharge: 2015-08-21 | Disposition: A | Discharge status: Home / Self Care | Payer: Commercial Managed Care - HMO | Source: Ambulatory Visit | Attending: Physician Assistant | Admitting: Physician Assistant

## 2015-08-21 DIAGNOSIS — E2839 Other primary ovarian failure: Secondary | ICD-10-CM

## 2015-08-21 DIAGNOSIS — Z1231 Encounter for screening mammogram for malignant neoplasm of breast: Secondary | ICD-10-CM | POA: Diagnosis not present

## 2015-08-21 DIAGNOSIS — Z780 Asymptomatic menopausal state: Secondary | ICD-10-CM | POA: Diagnosis not present

## 2015-08-21 DIAGNOSIS — Z13820 Encounter for screening for osteoporosis: Secondary | ICD-10-CM | POA: Diagnosis not present

## 2015-08-24 ENCOUNTER — Ambulatory Visit (INDEPENDENT_AMBULATORY_CARE_PROVIDER_SITE_OTHER): Payer: Self-pay | Admitting: Internal Medicine

## 2015-08-24 ENCOUNTER — Encounter: Payer: Self-pay | Admitting: Internal Medicine

## 2015-08-24 VITALS — BP 138/80 | HR 60 | Temp 97.2°F | Resp 16 | Ht 67.0 in | Wt 225.6 lb

## 2015-08-24 DIAGNOSIS — E039 Hypothyroidism, unspecified: Secondary | ICD-10-CM

## 2015-08-24 DIAGNOSIS — E1122 Type 2 diabetes mellitus with diabetic chronic kidney disease: Secondary | ICD-10-CM | POA: Diagnosis not present

## 2015-08-24 DIAGNOSIS — Z79899 Other long term (current) drug therapy: Secondary | ICD-10-CM

## 2015-08-24 DIAGNOSIS — N182 Chronic kidney disease, stage 2 (mild): Secondary | ICD-10-CM

## 2015-08-24 DIAGNOSIS — I10 Essential (primary) hypertension: Secondary | ICD-10-CM | POA: Diagnosis not present

## 2015-08-24 DIAGNOSIS — Z136 Encounter for screening for cardiovascular disorders: Secondary | ICD-10-CM

## 2015-08-24 DIAGNOSIS — E1140 Type 2 diabetes mellitus with diabetic neuropathy, unspecified: Secondary | ICD-10-CM

## 2015-08-24 DIAGNOSIS — K219 Gastro-esophageal reflux disease without esophagitis: Secondary | ICD-10-CM

## 2015-08-24 DIAGNOSIS — E559 Vitamin D deficiency, unspecified: Secondary | ICD-10-CM | POA: Diagnosis not present

## 2015-08-24 DIAGNOSIS — Z1212 Encounter for screening for malignant neoplasm of rectum: Secondary | ICD-10-CM

## 2015-08-24 DIAGNOSIS — R6889 Other general symptoms and signs: Secondary | ICD-10-CM | POA: Diagnosis not present

## 2015-08-24 DIAGNOSIS — E785 Hyperlipidemia, unspecified: Secondary | ICD-10-CM | POA: Diagnosis not present

## 2015-08-24 DIAGNOSIS — Z0001 Encounter for general adult medical examination with abnormal findings: Secondary | ICD-10-CM

## 2015-08-24 NOTE — Progress Notes (Signed)
Patient ID: Rebecca Graves, female   DOB: 02/02/1941, 75 y.o.   MRN: [id number]  [facility]                   Unk Pinto, M.D.    Uvaldo Bristle. Silverio Lay, P.A.-C      Starlyn Skeans, P.A.-C   [facility]                [address] [place] [id number] Telephone [phone] Telefax [phone]  ______________________________________________________________________  Annual Screening/Preventative Visit And Comprehensive Evaluation &  Examination     This very nice 75 y.o. [name] presents for a Wellness/Preventative Visit & comprehensive evaluation and management of multiple medical co-morbidities.  Patient has been followed for HTN, preDM, Hyperlipidemia and Vitamin D Deficiency. Patient has c/o  LBP and knee pains for which she has been seen by Dr Mayer Camel.       HTN predates since 1997.  Patient's BP has been controlled at home and patient denies any cardiac symptoms as chest pain, palpitations, shortness of breath, dizziness or ankle swelling. Today's BP: is 138/80 mmHg      Patient's hyperlipidemia is controlled with diet and medications. Patient denies myalgias or other medication SE's. Last lipids were at goal with  Cholesterol 187; HDL 84; LDL 75; Triglycerides 141 on  04/21/2015.       Patient has T2_NIDDM w/ CKD 2 and Peripheral Sensory Neuropathy predating since  2013  And she has attempted to manage this with diet.   She  denies reactive hypoglycemic symptoms, visual blurring, diabetic polys, or paresthesias.  Last A1c was  Not at goal with A1c 6.5% on 04/21/2015.    Patient has been on Thyroid replacement since circa 1988 discovered when she was preop for a TAH/BSO consequent of which she developed a surgical menopause.  Finally, patient has history of Vitamin D Deficiency of "73" in 2008 and last Vitamin D was  60 on 04/21/2015.    Medication Sig  . ALPRAZolam (XANAX) 1 MG tablet Take 1 tablet  (1 mg total) by mouth at bedtime as needed. for sleep  . aspirin 81 MG tablet Take 81 mg by mouth daily.  . SUPER B COMPLEX  Take 1 tablet by mouth daily.  . bisoprolol-hctz (ZIAC) 10-6.25  TAKE 1 TABLET EVERY DAY FOR BLOOD PRESSURE  . VITAMIN D 5000 UNITS TABS Take 1 tablet by mouth daily.  Marland Kitchen estradiol (ESTRACE) 0.5 MG tablet TAKE 1/2 TAB 3 x/wk on MWF AND  1 TAB x 4 days  . furosemide (LASIX) 40 MG tablet TAKE 1 TABLET EVERY DAY FOR BLOOD PRESSURE/FLUID  . gabapentin  300 MG capsule TAKE 1 CAPSULE THREE TIMES DAILY  FOR  NEUROPATHY  PAIN  . levothyroxine  100 MCG tablet TAKE 1 TABLET EVERY DAY  BEFORE  BREAKFAST  . LUMIGAN 0.01 % SOLN Place 1 drop into both eyes at bedtime.   . Magnesium 500 MG CAPS Take 2 capsules by mouth daily.   . meloxicam (MOBIC) 15 MG tablet TAKE 1 TABLET EVERY DAY  AFTER  A  MEAL  FOR  ARTHRITIS  PAIN -CONTACT DR OFFICE FOR APPOINTMENT  . omeprazole  40 MG capsule TAKE 1 CAPSULE EVERY DAY  FOR  ACID  REFLUX  . Red Yeast Rice 600 MG CAPS Take 1 capsule by mouth daily.  Marland Kitchen rOPINIRole (  REQUIP) 1 MG tablet TAKE 1 TABLET THREE TIMES DAILY AS NEEDED  FOR  RESTLESS  LEGS  . TIMOPTIC) 0.5 % ophth soln 1 drop 2 (two) times daily.  . traMADol (ULTRAM) 50 MG tablet Take 1 tablet (50 mg total) by mouth every 6 (six) hours as needed.   No Known Allergies  Past Medical History  Diagnosis Date  . Arthritis   . Glaucoma   . Hypothyroidism   . Hypertension   . Hyperlipidemia   . GERD (gastroesophageal reflux disease)   . Prediabetes   . Diverticulosis   . Colon polyps     hyperplastic  . Anal fissure   . Hemorrhoids    Health Maintenance  Topic Date Due  . URINE MICROALBUMIN  07/03/2015  . ZOSTAVAX  12/22/2016 (Originally 12/09/2000)  . INFLUENZA VACCINE  09/29/2015  . HEMOGLOBIN A1C  10/19/2015  . OPHTHALMOLOGY EXAM  01/15/2016  . FOOT EXAM  04/20/2016  . MAMMOGRAM  07/14/2016  . TETANUS/TDAP  04/25/2017  . COLONOSCOPY  11/29/2017  . DEXA SCAN  Completed  .  PNA vac Low Risk Adult  Completed   Immunization History  Administered Date(s) Administered  . Influenza-Unspecified 12/09/2013  . Pneumococcal Conjugate-13 04/21/2015  . Pneumococcal Polysaccharide-23 05/29/2008  . Td 04/26/2007   Past Surgical History  Procedure Laterality Date  . Abdominal hysterectomy    . Appendectomy    . Joint replacement  2007    rt hip replacement  . Bladder suspension    . Knee arthroscopy  with partial lat meniscectomy, Debridement Kerin Salen, MD 03/28/2011   Family History  Problem Relation Age of Onset  . Colon cancer Neg Hx   . Rectal cancer Neg Hx   . Stomach cancer Neg Hx   . Esophageal cancer Other     nefew- age 7   Social History  Substance Use Topics  . Smoking status: Former Smoker    Quit date: 03/22/1996  . Smokeless tobacco: Never Used  . Alcohol Use: 1.0 oz/week    2 drink(s) per week     Comment: occ    ROS Constitutional: Denies fever, chills, weight loss/gain, headaches, insomnia,  night sweats, and change in appetite. Does c/o fatigue. Eyes: Denies redness, blurred vision, diplopia, discharge, itchy, watery eyes.  ENT: Denies discharge, congestion, post nasal drip, epistaxis, sore throat, earache, hearing loss, dental pain, Tinnitus, Vertigo, Sinus pain, snoring.  Cardio: Denies chest pain, palpitations, irregular heartbeat, syncope, dyspnea, diaphoresis, orthopnea, PND, claudication, edema Respiratory: denies cough, dyspnea, DOE, pleurisy, hoarseness, laryngitis, wheezing.  Gastrointestinal: Denies dysphagia, heartburn, reflux, water brash, pain, cramps, nausea, vomiting, bloating, diarrhea, constipation, hematemesis, melena, hematochezia, jaundice, hemorrhoids Genitourinary: Denies dysuria, frequency, urgency, nocturia, hesitancy, discharge, hematuria, flank pain Breast: Breast lumps, nipple discharge, bleeding.  Musculoskeletal: Denies arthralgia, myalgia, stiffness, Jt. Swelling, pain, limp, and strain/sprain. Denies  falls. Skin: Denies puritis, rash, hives, warts, acne, eczema, changing in skin lesion Neuro: No weakness, tremor, incoordination, spasms, paresthesia, pain Psychiatric: Denies confusion, memory loss, sensory loss. Denies Depression. Endocrine: Denies change in weight, skin, hair change, nocturia, and paresthesia, diabetic polys, visual blurring, hyper / hypo glycemic episodes.  Heme/Lymph: No excessive bleeding, bruising, enlarged lymph nodes.  Physical Exam  BP 138/80    Pulse 60  Temp 97.2 F  Resp 16  Ht 5\' 7"   Wt 225 lb 9.6 oz     BMI 35.33   General Appearance: Over nourished and in no apparent distress.  Eyes: PERRLA, EOMs, conjunctiva no swelling or  erythema, normal fundi and vessels. Sinuses: No frontal/maxillary tenderness ENT/Mouth: EACs patent / TMs  nl. Nares clear without erythema, swelling, mucoid exudates. Oral hygiene is good. No erythema, swelling, or exudate. Tongue normal, non-obstructing. Tonsils not swollen or erythematous. Hearing normal.  Neck: Supple, thyroid normal. No bruits, nodes or JVD. Respiratory: Respiratory effort normal.  BS equal and clear bilateral without rales, rhonci, wheezing or stridor. Cardio: Heart sounds are normal with regular rate and rhythm and there is a soft Gr 2/4 SEM maximal at the Ao area. Peripheral pulses are normal and equal bilaterally without edema. No aortic or femoral bruits. Chest: symmetric with normal excursions and percussion. Breasts: Symmetric, without lumps, nipple discharge, retractions, or fibrocystic changes.  Abdomen:  Rotund , soft with bowel sounds active. Nontender, no guarding, rebound, hernias, masses, or organomegaly.  Lymphatics: Non tender without lymphadenopathy.  Musculoskeletal: Full ROM all peripheral extremities, joint stability, 5/5 strength, and normal gait. Skin: Warm and dry without rashes, lesions, cyanosis, clubbing or  ecchymosis.  Neuro: Cranial nerves intact, reflexes equal bilaterally. Normal  muscle tone, no cerebellar symptoms. Sensation intact to touch and Vibratory , but decreased from the distal shins in a stocking fashion to the toes bilaterally by Monofilament testing.  Pysch: Alert and oriented X 3, normal affect, Insight and Judgment appropriate.   Assessment and Plan 1. Annual Preventative Screening Examination  - Microalbumin / creatinine urine ratio - EKG 12-Lead - [place], RETROPERITNL ABD,  LTD - POC Hemoccult Bld/Stl - Urinalysis, Routine w reflex microscopic  - HM DIABETES FOOT EXAM - LOW EXTREMITY NEUR EXAM DOCUM - CBC with Differential/Platelet - BASIC METABOLIC PANEL WITH GFR - Hepatic function panel - Magnesium - Lipid panel - TSH - Hemoglobin A1c - Insulin, random - VITAMIN D 25 Hydroxy  2. Essential hypertension  - EKG 12-Lead - [place], RETROPERITNL ABD,  LTD - TSH  3. Hyperlipidemia  - Lipid panel - TSH  4. Type 2 diabetes mellitus with diabetic neuropathy, without long-term current use of insulin (HCC)  - Microalbumin / creatinine urine ratio - HM DIABETES FOOT EXAM - LOW EXTREMITY NEUR EXAM DOCUM - Hemoglobin A1c - Insulin, random  5. Vitamin D deficiency  - VITAMIN D 25 Hydroxy   6. Controlled type 2 diabetes mellitus with stage 2 chronic kidney disease, without long-term current use of insulin ([place])   7. Gastroesophageal reflux disease,   8. Hypothyroidism  - TSH  9. Screening for rectal cancer  - POC Hemoccult Bld/Stl   10. Medication management  - Urinalysis, Routine w reflex microscopic  - CBC with Differential/Platelet - BASIC METABOLIC PANEL WITH GFR - Hepatic function panel - Magnesium  11. Screening for AAA (aortic abdominal aneurysm)   12. Screening for ischemic heart disease   Continue prudent diet as discussed, weight control, BP monitoring, regular exercise, and medications. Discussed med's effects and SE's. Screening labs and tests as requested with regular follow-up as recommended. Over 40 minutes of  exam, counseling, chart review and high complex critical decision making was performed.

## 2015-08-24 NOTE — Patient Instructions (Addendum)

## 2015-08-26 ENCOUNTER — Other Ambulatory Visit: Payer: Self-pay | Admitting: *Deleted

## 2015-08-26 MED ORDER — ALPRAZOLAM 1 MG PO TABS: 1.0000 mg | ORAL_TABLET | Freq: Every evening | ORAL | Status: DC | PRN

## 2015-08-31 ENCOUNTER — Other Ambulatory Visit: Payer: Self-pay | Admitting: Internal Medicine

## 2015-09-04 DIAGNOSIS — H348122 Central retinal vein occlusion, left eye, stable: Secondary | ICD-10-CM | POA: Diagnosis not present

## 2015-09-04 DIAGNOSIS — H4710 Unspecified papilledema: Secondary | ICD-10-CM | POA: Diagnosis not present

## 2015-09-14 ENCOUNTER — Other Ambulatory Visit: Payer: Self-pay

## 2015-09-14 DIAGNOSIS — Z1212 Encounter for screening for malignant neoplasm of rectum: Secondary | ICD-10-CM

## 2015-09-14 DIAGNOSIS — Z0001 Encounter for general adult medical examination with abnormal findings: Secondary | ICD-10-CM

## 2015-09-14 LAB — POC HEMOCCULT BLD/STL (HOME/3-CARD/SCREEN)
FECAL OCCULT BLD: NEGATIVE
FECAL OCCULT BLD: NEGATIVE
FECAL OCCULT BLD: NEGATIVE

## 2015-10-23 DIAGNOSIS — H401132 Primary open-angle glaucoma, bilateral, moderate stage: Secondary | ICD-10-CM | POA: Diagnosis not present

## 2015-11-11 ENCOUNTER — Other Ambulatory Visit: Payer: Self-pay | Admitting: Internal Medicine

## 2015-11-19 DIAGNOSIS — H348122 Central retinal vein occlusion, left eye, stable: Secondary | ICD-10-CM | POA: Diagnosis not present

## 2015-12-02 ENCOUNTER — Encounter: Payer: Self-pay | Admitting: Internal Medicine

## 2015-12-02 ENCOUNTER — Ambulatory Visit (INDEPENDENT_AMBULATORY_CARE_PROVIDER_SITE_OTHER): Payer: Commercial Managed Care - HMO | Admitting: Internal Medicine

## 2015-12-02 VITALS — BP 128/76 | HR 60 | Temp 98.2°F | Resp 18 | Ht 67.0 in | Wt 225.0 lb

## 2015-12-02 DIAGNOSIS — E559 Vitamin D deficiency, unspecified: Secondary | ICD-10-CM | POA: Diagnosis not present

## 2015-12-02 DIAGNOSIS — Z79899 Other long term (current) drug therapy: Secondary | ICD-10-CM

## 2015-12-02 DIAGNOSIS — G629 Polyneuropathy, unspecified: Secondary | ICD-10-CM | POA: Diagnosis not present

## 2015-12-02 DIAGNOSIS — E1122 Type 2 diabetes mellitus with diabetic chronic kidney disease: Secondary | ICD-10-CM

## 2015-12-02 DIAGNOSIS — E039 Hypothyroidism, unspecified: Secondary | ICD-10-CM | POA: Diagnosis not present

## 2015-12-02 DIAGNOSIS — N182 Chronic kidney disease, stage 2 (mild): Secondary | ICD-10-CM | POA: Diagnosis not present

## 2015-12-02 DIAGNOSIS — E782 Mixed hyperlipidemia: Secondary | ICD-10-CM | POA: Diagnosis not present

## 2015-12-02 DIAGNOSIS — G608 Other hereditary and idiopathic neuropathies: Secondary | ICD-10-CM

## 2015-12-02 DIAGNOSIS — E1140 Type 2 diabetes mellitus with diabetic neuropathy, unspecified: Secondary | ICD-10-CM

## 2015-12-02 DIAGNOSIS — I10 Essential (primary) hypertension: Secondary | ICD-10-CM | POA: Diagnosis not present

## 2015-12-02 LAB — HEPATIC FUNCTION PANEL
ALBUMIN: 4.7 g/dL (ref 3.6–5.1)
ALK PHOS: 52 U/L (ref 33–130)
ALT: 14 U/L (ref 6–29)
AST: 16 U/L (ref 10–35)
BILIRUBIN DIRECT: 0.2 mg/dL (ref <=0.2)
BILIRUBIN INDIRECT: 0.7 mg/dL (ref 0.2–1.2)
BILIRUBIN TOTAL: 0.9 mg/dL (ref 0.2–1.2)
Total Protein: 7.8 g/dL (ref 6.1–8.1)

## 2015-12-02 LAB — BASIC METABOLIC PANEL WITH GFR
BUN: 17 mg/dL (ref 7–25)
CHLORIDE: 101 mmol/L (ref 98–110)
CO2: 30 mmol/L (ref 20–31)
Calcium: 10.3 mg/dL (ref 8.6–10.4)
Creat: 1.11 mg/dL — ABNORMAL HIGH (ref 0.60–0.93)
GFR, EST AFRICAN AMERICAN: 57 mL/min — AB (ref >=60)
GFR, EST NON AFRICAN AMERICAN: 49 mL/min — AB (ref >=60)
Glucose, Bld: 101 mg/dL — ABNORMAL HIGH (ref 65–99)
POTASSIUM: 4.2 mmol/L (ref 3.5–5.3)
SODIUM: 143 mmol/L (ref 135–146)

## 2015-12-02 LAB — CBC WITH DIFFERENTIAL/PLATELET
BASOS PCT: 0 %
Basophils Absolute: 0 cells/uL (ref 0–200)
EOS ABS: 96 {cells}/uL (ref 15–500)
EOS PCT: 2 %
HCT: 42.1 % (ref 35.0–45.0)
Hemoglobin: 14.5 g/dL (ref 11.7–15.5)
LYMPHS PCT: 47 %
Lymphs Abs: 2256 cells/uL (ref 850–3900)
MCH: 27.9 pg (ref 27.0–33.0)
MCHC: 34.4 g/dL (ref 32.0–36.0)
MCV: 81.1 fL (ref 80.0–100.0)
MONOS PCT: 10 %
MPV: 10 fL (ref 7.5–12.5)
Monocytes Absolute: 480 cells/uL (ref 200–950)
NEUTROS ABS: 1968 {cells}/uL (ref 1500–7800)
Neutrophils Relative %: 41 %
PLATELETS: 268 10*3/uL (ref 140–400)
RBC: 5.19 MIL/uL — AB (ref 3.80–5.10)
RDW: 14 % (ref 11.0–15.0)
WBC: 4.8 10*3/uL (ref 3.8–10.8)

## 2015-12-02 LAB — LIPID PANEL
CHOL/HDL RATIO: 2.1 ratio (ref <=5.0)
Cholesterol: 202 mg/dL — ABNORMAL HIGH (ref 125–200)
HDL: 98 mg/dL (ref >=46)
LDL Cholesterol: 80 mg/dL (ref <130)
Triglycerides: 121 mg/dL (ref <150)
VLDL: 24 mg/dL (ref <30)

## 2015-12-02 LAB — TSH: TSH: 4.11 mIU/L

## 2015-12-02 MED ORDER — TRAZODONE HCL 100 MG PO TABS: 100.0000 mg | ORAL_TABLET | Freq: Every day | ORAL | 0 refills | Status: DC

## 2015-12-02 NOTE — Progress Notes (Signed)
Assessment and Plan:  Hypertension:  -Continue medication,  -monitor blood pressure at home.  -Continue DASH diet.   -Reminder to go to the ER if any CP, SOB, nausea, dizziness, severe HA, changes vision/speech, left arm numbness and tingling, and jaw pain.  Cholesterol: -Continue diet and exercise.  -Check cholesterol.   Pre-diabetes: -Continue diet and exercise.  -Check A1C  Vitamin D Def: -check level -continue medications.   PVD -cont compression socks -cont lyrica prn -exercise and walk as much as possible  Arthritis 1st MCP -tylenol -stiff soled shoes  Continue diet and meds as discussed. Further disposition pending results of labs.  HPI 75 y.o. female  presents for 3 month follow up with hypertension, hyperlipidemia, prediabetes and vitamin D.   Her blood pressure has been controlled at home, today their BP is BP: 128/76.   She does workout. She denies chest pain, shortness of breath, dizziness.   She is on cholesterol medication and denies myalgias. Her cholesterol is at goal. The cholesterol last visit was:   Lab Results  Component Value Date   CHOL 187 04/21/2015   HDL 84 04/21/2015   LDLCALC 75 04/21/2015   TRIG 141 04/21/2015   CHOLHDL 2.2 04/21/2015     She has been working on diet and exercise for prediabetes, and denies foot ulcerations, hyperglycemia, hypoglycemia , increased appetite, nausea, paresthesia of the feet, polydipsia, polyuria, visual disturbances, vomiting and weight loss. Last A1C in the office was:  Lab Results  Component Value Date   HGBA1C 6.5 (H) 04/21/2015    Patient is on Vitamin D supplement.  Lab Results  Component Value Date   VD25OH 60 04/21/2015      She reports that she does have some issues with her varicose veins bothering her.  She reports that she is not great with taking her lyrica and restless legs syndrome.  She is not wearing her compression socks.  She reports that she also hasn't been walking much.  She does  tend to wear open back      Current Medications:  Current Outpatient Prescriptions on File Prior to Visit  Medication Sig Dispense Refill  . ALPRAZolam (XANAX) 1 MG tablet Take 1 tablet (1 mg total) by mouth at bedtime as needed. for sleep 90 tablet 1  . aspirin 81 MG tablet Take 81 mg by mouth daily.    . B Complex-C (SUPER B COMPLEX PO) Take 1 tablet by mouth daily.    . bisoprolol-hydrochlorothiazide (ZIAC) 10-6.25 MG tablet TAKE 1 TABLET EVERY DAY FOR BLOOD PRESSURE 90 tablet 1  . Cholecalciferol (VITAMIN D-3) 5000 UNITS TABS Take 1 tablet by mouth daily.    Marland Kitchen estradiol (ESTRACE) 0.5 MG tablet TAKE 1/2 TABLET ON MONDAY, WEDNESDAY AND FRIDAY AND TAKE 1 TABLET THE REST OF THE WEEK (NEED TO WEAN DOWN) 72 tablet 1  . furosemide (LASIX) 40 MG tablet TAKE 1 TABLET EVERY DAY FOR BLOOD PRESSURE/FLUID 90 tablet 1  . gabapentin (NEURONTIN) 300 MG capsule TAKE 1 CAPSULE THREE TIMES DAILY  FOR  NEUROPATHY  PAIN 270 capsule 1  . levothyroxine (SYNTHROID, LEVOTHROID) 100 MCG tablet TAKE 1 TABLET EVERY DAY  BEFORE  BREAKFAST 90 tablet 2  . LUMIGAN 0.01 % SOLN Place 1 drop into both eyes at bedtime.     . Magnesium 500 MG CAPS Take 2 capsules by mouth daily.     . meloxicam (MOBIC) 15 MG tablet TAKE 1 TABLET EVERY DAY  AFTER  A  MEAL  FOR  ARTHRITIS  PAIN -CONTACT DR OFFICE FOR APPOINTMENT 90 tablet 3  . omeprazole (PRILOSEC) 40 MG capsule TAKE 1 CAPSULE EVERY DAY  FOR  ACID  REFLUX 90 capsule 1  . Red Yeast Rice 600 MG CAPS Take 1 capsule by mouth daily.    Marland Kitchen rOPINIRole (REQUIP) 1 MG tablet TAKE 1 TABLET THREE TIMES DAILY AS NEEDED  FOR  RESTLESS  LEGS 270 tablet 3  . timolol (TIMOPTIC) 0.5 % ophthalmic solution 1 drop 2 (two) times daily.    . traMADol (ULTRAM) 50 MG tablet Take 1 tablet (50 mg total) by mouth every 6 (six) hours as needed. 360 tablet 1   No current facility-administered medications on file prior to visit.     Medical History:  Past Medical History:  Diagnosis Date  . Anal  fissure   . Arthritis   . Colon polyps    hyperplastic  . Diverticulosis   . GERD (gastroesophageal reflux disease)   . Glaucoma   . Hemorrhoids   . Hyperlipidemia   . Hypertension   . Hypothyroidism   . Prediabetes     Allergies: No Known Allergies   Review of Systems:  Review of Systems  Constitutional: Negative for chills, fever and malaise/fatigue.  HENT: Negative for congestion, ear pain and sore throat.   Eyes: Negative.   Respiratory: Negative for cough, shortness of breath and wheezing.   Cardiovascular: Negative for chest pain, palpitations and leg swelling.  Gastrointestinal: Negative for abdominal pain, blood in stool, constipation, diarrhea, heartburn and melena.  Genitourinary: Negative.   Skin: Negative.   Neurological: Negative for dizziness, sensory change, loss of consciousness and headaches.  Psychiatric/Behavioral: Negative for depression. The patient is not nervous/anxious and does not have insomnia.     Family history- Review and unchanged  Social history- Review and unchanged  Physical Exam: BP 128/76   Pulse 60   Temp 98.2 F (36.8 C) (Temporal)   Resp 18   Ht 5\' 7"  (1.702 m)   Wt 225 lb (102.1 kg)   BMI 35.24 kg/m  Wt Readings from Last 3 Encounters:  12/02/15 225 lb (102.1 kg)  08/24/15 225 lb 9.6 oz (102.3 kg)  07/21/15 223 lb 12.8 oz (101.5 kg)    General Appearance: Well nourished well developed, in no apparent distress. Eyes: PERRLA, EOMs, conjunctiva no swelling or erythema ENT/Mouth: Ear canals normal without obstruction, swelling, erythma, discharge.  TMs normal bilaterally.  Oropharynx moist, clear, without exudate, or postoropharyngeal swelling. Neck: Supple, thyroid normal,no cervical adenopathy  Respiratory: Respiratory effort normal, Breath sounds clear A&P without rhonchi, wheeze, or rale.  No retractions, no accessory usage. Cardio: RRR with no MRGs. Brisk peripheral pulses without edema.  Abdomen: Soft, + BS,  Non tender,  no guarding, rebound, hernias, masses. Musculoskeletal: Full ROM, 5/5 strength, Normal gait Skin: Warm, dry without rashes, lesions, ecchymosis.  Neuro: Awake and oriented X 3, Cranial nerves intact. Normal muscle tone, no cerebellar symptoms. Psych: Normal affect, Insight and Judgment appropriate.    Starlyn Skeans, PA-C 3:00 PM [facility]

## 2015-12-02 NOTE — Patient Instructions (Signed)
Varicose Veins Varicose veins are veins that have become enlarged and twisted. CAUSES This condition is the result of valves in the veins not working properly. Valves in the veins help return blood from the leg to the heart. When your calf muscles squeeze, the blood moves up your leg then the valves close and this continues until the blood gets back to your heart.  If these valves are damaged, blood flows backwards and backs up into the veins in the leg near the skin OR if your are sitting/standing for a long time without using your calf muscles the blood will back up into the veins in your legs. This causes the veins to become larger. People who are on their feet a lot, sit a lot without walking (like on a plane, at a desk, or in a car), who are pregnant, or who are overweight are more likely to develop varicose veins. SYMPTOMS   Bulging, twisted-appearing, bluish veins, most commonly found on the legs.  Leg pain or a feeling of heaviness. These symptoms may be worse at the end of the day.  Leg swelling.  Skin color changes. DIAGNOSIS  Varicose veins can usually be diagnosed with an exam of your legs by your caregiver. He or she may recommend an ultrasound of your leg veins. TREATMENT  Most varicose veins can be treated at home.However, other treatments are available for people who have persistent symptoms or who want to treat the cosmetic appearance of the varicose veins. But this is only cosmetic and they will return if not properly treated. These include:  Laser treatment of very small varicose veins.  Medicine that is shot (injected) into the vein. This medicine hardens the walls of the vein and closes off the vein. This treatment is called sclerotherapy. Afterwards, you may need to wear clothing or bandages that apply pressure.  Surgery. HOME CARE INSTRUCTIONS   Do not stand or sit in one position for long periods of time. Do not sit with your legs crossed. Rest with your legs raised  during the day.  Your legs have to be higher than your heart so that gravity will force the valves to open, so please really elevate your legs.   Wear elastic stockings or support hose. Do not wear other tight, encircling garments around the legs, pelvis, or waist.  [employer]  has a wide variety of well priced compression stockings. [address], [place] [address] [phone]  OR THERE ARE COPPER INFUSED COMPRESSION SOCKS AT [facility] OR [facility]  Walk as much as possible to increase blood flow.  Raise the foot of your bed at night with 2-inch blocks.  If you get a cut in the skin over the vein and the vein bleeds, lie down with your leg raised and press on it with a clean cloth until the bleeding stops. Then place a bandage (dressing) on the cut. See your caregiver if it continues to bleed or needs stitches. SEEK MEDICAL CARE IF:   The skin around your ankle starts to break down.  You have pain, redness, tenderness, or hard swelling developing in your leg over a vein.  You are uncomfortable due to leg pain. Document Released: 11/24/2004 Document Revised: 05/09/2011 Document Reviewed: 04/12/2010 [employer] Patient Information 2014 [employer].   

## 2015-12-03 LAB — HEMOGLOBIN A1C
HEMOGLOBIN A1C: 6.2 % — AB (ref <5.7)
MEAN PLASMA GLUCOSE: 131 mg/dL

## 2016-01-25 DIAGNOSIS — H401132 Primary open-angle glaucoma, bilateral, moderate stage: Secondary | ICD-10-CM | POA: Diagnosis not present

## 2016-02-01 ENCOUNTER — Other Ambulatory Visit: Payer: Self-pay | Admitting: Internal Medicine

## 2016-02-15 ENCOUNTER — Other Ambulatory Visit: Payer: Self-pay | Admitting: Internal Medicine

## 2016-03-16 ENCOUNTER — Ambulatory Visit: Payer: Self-pay | Admitting: Internal Medicine

## 2016-03-23 ENCOUNTER — Other Ambulatory Visit: Payer: Self-pay | Admitting: Internal Medicine

## 2016-03-23 NOTE — Telephone Encounter (Signed)
Please call Alpraz  

## 2016-03-24 ENCOUNTER — Other Ambulatory Visit: Payer: Self-pay | Admitting: *Deleted

## 2016-03-24 MED ORDER — ALPRAZOLAM 1 MG PO TABS: 1.0000 mg | ORAL_TABLET | Freq: Every evening | ORAL | 1 refills | Status: DC | PRN

## 2016-04-05 ENCOUNTER — Ambulatory Visit: Payer: Self-pay | Admitting: Internal Medicine

## 2016-04-07 ENCOUNTER — Ambulatory Visit (INDEPENDENT_AMBULATORY_CARE_PROVIDER_SITE_OTHER): Payer: Medicare HMO | Admitting: Internal Medicine

## 2016-04-07 VITALS — BP 124/66 | HR 52 | Temp 97.3°F | Resp 16 | Ht 67.0 in | Wt 223.6 lb

## 2016-04-07 DIAGNOSIS — E782 Mixed hyperlipidemia: Secondary | ICD-10-CM

## 2016-04-07 DIAGNOSIS — E1122 Type 2 diabetes mellitus with diabetic chronic kidney disease: Secondary | ICD-10-CM

## 2016-04-07 DIAGNOSIS — N182 Chronic kidney disease, stage 2 (mild): Secondary | ICD-10-CM | POA: Diagnosis not present

## 2016-04-07 DIAGNOSIS — E039 Hypothyroidism, unspecified: Secondary | ICD-10-CM | POA: Diagnosis not present

## 2016-04-07 DIAGNOSIS — K219 Gastro-esophageal reflux disease without esophagitis: Secondary | ICD-10-CM | POA: Diagnosis not present

## 2016-04-07 DIAGNOSIS — I10 Essential (primary) hypertension: Secondary | ICD-10-CM | POA: Diagnosis not present

## 2016-04-07 DIAGNOSIS — Z79899 Other long term (current) drug therapy: Secondary | ICD-10-CM | POA: Diagnosis not present

## 2016-04-07 DIAGNOSIS — E559 Vitamin D deficiency, unspecified: Secondary | ICD-10-CM

## 2016-04-07 LAB — LIPID PANEL
CHOL/HDL RATIO: 2.7 ratio (ref <5.0)
Cholesterol: 199 mg/dL (ref <200)
HDL: 75 mg/dL (ref >50)
LDL CALC: 98 mg/dL (ref <100)
TRIGLYCERIDES: 131 mg/dL (ref <150)
VLDL: 26 mg/dL (ref <30)

## 2016-04-07 LAB — BASIC METABOLIC PANEL WITH GFR
BUN: 16 mg/dL (ref 7–25)
CHLORIDE: 102 mmol/L (ref 98–110)
CO2: 28 mmol/L (ref 20–31)
Calcium: 10.5 mg/dL — ABNORMAL HIGH (ref 8.6–10.4)
Creat: 1.13 mg/dL — ABNORMAL HIGH (ref 0.60–0.93)
GFR, EST AFRICAN AMERICAN: 55 mL/min — AB (ref >=60)
GFR, EST NON AFRICAN AMERICAN: 48 mL/min — AB (ref >=60)
Glucose, Bld: 149 mg/dL — ABNORMAL HIGH (ref 65–99)
Potassium: 4.8 mmol/L (ref 3.5–5.3)
SODIUM: 143 mmol/L (ref 135–146)

## 2016-04-07 LAB — HEPATIC FUNCTION PANEL
ALBUMIN: 4.6 g/dL (ref 3.6–5.1)
ALK PHOS: 51 U/L (ref 33–130)
ALT: 16 U/L (ref 6–29)
AST: 18 U/L (ref 10–35)
BILIRUBIN DIRECT: 0.1 mg/dL (ref <=0.2)
BILIRUBIN INDIRECT: 0.5 mg/dL (ref 0.2–1.2)
BILIRUBIN TOTAL: 0.6 mg/dL (ref 0.2–1.2)
Total Protein: 7.4 g/dL (ref 6.1–8.1)

## 2016-04-07 LAB — CBC WITH DIFFERENTIAL/PLATELET
BASOS PCT: 1 %
Basophils Absolute: 33 cells/uL (ref 0–200)
EOS ABS: 99 {cells}/uL (ref 15–500)
Eosinophils Relative: 3 %
HCT: 41.8 % (ref 35.0–45.0)
Hemoglobin: 14 g/dL (ref 11.7–15.5)
LYMPHS PCT: 49 %
Lymphs Abs: 1617 cells/uL (ref 850–3900)
MCH: 27.9 pg (ref 27.0–33.0)
MCHC: 33.5 g/dL (ref 32.0–36.0)
MCV: 83.3 fL (ref 80.0–100.0)
MONOS PCT: 9 %
MPV: 10 fL (ref 7.5–12.5)
Monocytes Absolute: 297 cells/uL (ref 200–950)
NEUTROS ABS: 1254 {cells}/uL — AB (ref 1500–7800)
Neutrophils Relative %: 38 %
PLATELETS: 235 10*3/uL (ref 140–400)
RBC: 5.02 MIL/uL (ref 3.80–5.10)
RDW: 14.3 % (ref 11.0–15.0)
WBC: 3.3 10*3/uL — AB (ref 3.8–10.8)

## 2016-04-07 LAB — MAGNESIUM: Magnesium: 1.9 mg/dL (ref 1.5–2.5)

## 2016-04-07 LAB — TSH: TSH: 1.3 m[IU]/L

## 2016-04-07 NOTE — Patient Instructions (Signed)

## 2016-04-07 NOTE — Progress Notes (Signed)
[facility] Unk Pinto, M.D.        Uvaldo Bristle. Silverio Lay, P.A.-C       Starlyn Skeans, P.A.-C  [facility]                [address] [place] [id number] Telephone [phone] Telefax [phone] ______________________________________________________________________     This very nice 76 y.o. DBF presents for 6 month follow up with Hypertension, Hyperlipidemia, Pre-Diabetes and Vitamin D Deficiency.      Patient is treated for HTN (1997) & BP has been controlled at home. Today's BP is at goal - 124/66. Patient has had no complaints of any cardiac type chest pain, palpitations, dyspnea/orthopnea/PND, dizziness, claudication, or dependent edema.     Hyperlipidemia is controlled with diet & meds. Patient denies myalgias or other med SE's. Last Lipids were at goal: Lab Results  Component Value Date   CHOL 202 (H) 12/02/2015   HDL 98 12/02/2015   LDLCALC 80 12/02/2015   TRIG 121 12/02/2015   CHOLHDL 2.1 12/02/2015      Also, the patient has history of Morbid Obesity (BMI 35+) and consequent  T2_NIDDM (2013) w/CKD2 and peripheral sensory Neuropathy and has had no symptoms of reactive hypoglycemia, diabetic polys or visual blurring, but does have occasional paresthesias of the distal LE's. Last A1c was not at goal: Lab Results  Component Value Date   HGBA1C 6.2 (H) 12/02/2015      Patient has been on Thyroid replacement since 1988.  Further, the patient also has history of Vitamin D Deficiency ("26" in 2008) and supplements vitamin D without any suspected side-effects. Last vitamin D was at goal:  Lab Results  Component Value Date   VD25OH 60 04/21/2015   Current Outpatient Prescriptions on File Prior to Visit  Medication Sig  . ALPRAZolam (XANAX) 1 MG tablet Take 1 tablet (1 mg total) by mouth at bedtime as needed. for sleep  . aspirin 81 MG tablet Take 81 mg by mouth 2 (two) times daily.    . B Complex-C (SUPER B COMPLEX PO) Take 1 tablet by mouth daily.  . bisoprolol-hydrochlorothiazide (ZIAC) 10-6.25 MG tablet TAKE 1 TABLET EVERY DAY FOR BLOOD PRESSURE  . Cholecalciferol (VITAMIN D-3) 5000 UNITS TABS Take 1 tablet by mouth daily.  Marland Kitchen estradiol (ESTRACE) 0.5 MG tablet TAKE 1/2 TABLET ON MONDAY, WEDNESDAY AND FRIDAY AND TAKE 1 TABLET THE REST OF THE WEEK (NEED TO WEAN DOWN) (Patient taking differently: takes 1 tablet daily.)  . furosemide (LASIX) 40 MG tablet TAKE 1 TABLET EVERY DAY FOR BLOOD PRESSURE/FLUID  . gabapentin (NEURONTIN) 300 MG capsule TAKE 1 CAPSULE THREE TIMES DAILY  FOR  NEUROPATHY  PAIN  . levothyroxine (SYNTHROID, LEVOTHROID) 100 MCG tablet TAKE 1 TABLET EVERY DAY  BEFORE  BREAKFAST  . LUMIGAN 0.01 % SOLN Place 1 drop into both eyes at bedtime.   . Magnesium 500 MG CAPS Take 2 capsules by mouth daily.   . meloxicam (MOBIC) 15 MG tablet TAKE 1 TABLET EVERY DAY  AFTER  A  MEAL  FOR  ARTHRITIS  PAIN -CONTACT DR OFFICE FOR APPOINTMENT  . omeprazole (PRILOSEC) 40 MG capsule TAKE 1 CAPSULE EVERY DAY  FOR  ACID  REFLUX  . Red Yeast Rice 600 MG CAPS Take 1 capsule by mouth daily.  Marland Kitchen rOPINIRole (REQUIP) 1 MG tablet TAKE 1 TABLET THREE TIMES DAILY AS  NEEDED  FOR  RESTLESS  LEGS  . traMADol (ULTRAM) 50 MG tablet Take 1 tablet (50 mg total) by mouth every 6 (six) hours as needed.  . traZODone (DESYREL) 100 MG tablet Take 1 tablet (100 mg total) by mouth at bedtime.   No current facility-administered medications on file prior to visit.    No Known Allergies PMHx:   Past Medical History:  Diagnosis Date  . Anal fissure   . Arthritis   . Colon polyps    hyperplastic  . Diverticulosis   . GERD (gastroesophageal reflux disease)   . Glaucoma   . Hemorrhoids   . Hyperlipidemia   . Hypertension   . Hypothyroidism   . Prediabetes    Immunization History  Administered Date(s) Administered  . Influenza-Unspecified 12/09/2013, 11/14/2015  . Pneumococcal Conjugate-13  04/21/2015  . Pneumococcal Polysaccharide-23 05/29/2008  . Td 04/26/2007   Past Surgical History:  Procedure Laterality Date  . ABDOMINAL HYSTERECTOMY    . APPENDECTOMY    . BLADDER SUSPENSION    . JOINT REPLACEMENT  2007   rt hip replacement  . KNEE ARTHROSCOPY  03/28/2011   Procedure: ARTHROSCOPY KNEE;  Surgeon: Kerin Salen, MD;  Location: [facility];  Service: Orthopedics;  Laterality: Right;  with partial lateral meniscectomy, Debridement of Chondromalacia   FHx:    Reviewed / unchanged  SHx:    Reviewed / unchanged  Systems Review:  Constitutional: Denies fever, chills, wt changes, headaches, insomnia, fatigue, night sweats, change in appetite. Eyes: Denies redness, blurred vision, diplopia, discharge, itchy, watery eyes.  ENT: Denies discharge, congestion, post nasal drip, epistaxis, sore throat, earache, hearing loss, dental pain, tinnitus, vertigo, sinus pain, snoring.  CV: Denies chest pain, palpitations, irregular heartbeat, syncope, dyspnea, diaphoresis, orthopnea, PND, claudication or edema. Respiratory: denies cough, dyspnea, DOE, pleurisy, hoarseness, laryngitis, wheezing.  Gastrointestinal: Denies dysphagia, odynophagia, heartburn, reflux, water brash, abdominal pain or cramps, nausea, vomiting, bloating, diarrhea, constipation, hematemesis, melena, hematochezia  or hemorrhoids. Genitourinary: Denies dysuria, frequency, urgency, nocturia, hesitancy, discharge, hematuria or flank pain. Musculoskeletal: Denies arthralgias, myalgias, stiffness, jt. swelling, pain, limping or strain/sprain.  Skin: Denies pruritus, rash, hives, warts, acne, eczema or change in skin lesion(s). Neuro: No weakness, tremor, incoordination, spasms, paresthesia or pain. Psychiatric: Denies confusion, memory loss or sensory loss. Endo: Denies change in weight, skin or hair change.  Heme/Lymph: No excessive bleeding, bruising or enlarged lymph nodes.  Physical Exam  BP 124/66    Pulse (!) 52   Temp 97.3 F (36.3 C)   Resp 16   Ht 5\' 7"  (1.702 m)   Wt 223 lb 9.6 oz (101.4 kg)   BMI 35.02 kg/m   Appears Over nourished and in no distress.  Eyes: PERRLA, EOMs, conjunctiva no swelling or erythema. Sinuses: No frontal/maxillary tenderness ENT/Mouth: EAC's clear, TM's nl w/o erythema, bulging. Nares clear w/o erythema, swelling, exudates. Oropharynx clear without erythema or exudates. Oral hygiene is good. Tongue normal, non obstructing. Hearing intact.  Neck: Supple. Thyroid nl. Car 2+/2+ without bruits, nodes or JVD. Chest: Respirations nl with BS clear & equal w/o rales, rhonchi, wheezing or stridor.  Cor: Heart sounds normal w/ regular rate and rhythm without sig. murmurs, gallops, clicks, or rubs. Peripheral pulses normal and equal  without edema.  Abdomen: Soft & bowel sounds normal. Non-tender w/o guarding, rebound, hernias, masses, or organomegaly.  Lymphatics: Unremarkable.  Musculoskeletal: Full ROM all peripheral extremities, joint stability, 5/5 strength, and normal gait.  Skin: Warm, dry without exposed rashes, lesions  or ecchymosis apparent.  Neuro: Cranial nerves intact, reflexes equal bilaterally. Sensory-motor testing grossly intact. Tendon reflexes grossly intact.  Pysch: Alert & oriented x 3.  Insight and judgement nl & appropriate. No ideations.  Assessment and Plan:  1. Essential hypertension  - Continue medication, monitor blood pressure at home.  - Continue DASH diet. Reminder to go to the ER if any CP,  SOB, nausea, dizziness, severe HA, changes vision/speech,  left arm numbness and tingling and jaw pain.   - CBC with Differential/Platelet - BASIC METABOLIC PANEL WITH GFR  2. Mixed hyperlipidemia  - Continue diet/meds, exercise,& lifestyle modifications.  - Continue monitor periodic cholesterol/liver & renal functions    - Hepatic function panel - Lipid panel - TSH - Hemoglobin A1c - Insulin, random  3. T2_NIDDM w/CKD2   ([place])  - Continue diet, exercise, lifestyle modifications.  - Monitor appropriate labs. - VITAMIN D 25 Hydroxy  4. Vitamin D deficiency  - Continue supplementation.  - VITAMIN D 25 Hydroxy  5. Hypothyroidism  - TSH  6. Gastroesophageal reflux disease   7. Medication management  - CBC with Differential/Platelet - BASIC METABOLIC PANEL WITH GFR - Hepatic function panel - Magnesium - Lipid panel       Recommended regular exercise, BP monitoring, weight control, and discussed med and SE's. Recommended labs to assess and monitor clinical status. Further disposition pending results of labs. Over 30 minutes of exam, counseling, chart review was performed

## 2016-04-08 LAB — HEMOGLOBIN A1C
Hgb A1c MFr Bld: 6.2 % — ABNORMAL HIGH (ref <5.7)
Mean Plasma Glucose: 131 mg/dL

## 2016-04-08 LAB — INSULIN, RANDOM: INSULIN: 79.6 u[IU]/mL — AB (ref 2.0–19.6)

## 2016-04-08 LAB — VITAMIN D 25 HYDROXY (VIT D DEFICIENCY, FRACTURES): Vit D, 25-Hydroxy: 88 ng/mL (ref 30–100)

## 2016-04-09 ENCOUNTER — Encounter: Payer: Self-pay | Admitting: Internal Medicine

## 2016-04-29 DIAGNOSIS — H401132 Primary open-angle glaucoma, bilateral, moderate stage: Secondary | ICD-10-CM | POA: Diagnosis not present

## 2016-07-07 NOTE — Progress Notes (Signed)
MEDICARE ANNUAL WELLNESS VISIT AND FOLLOW UP  Assessment:   Essential hypertension - continue medications, DASH diet, exercise and monitor at home. Call if greater than 130/80. -     CBC with Differential/Platelet -     BASIC METABOLIC PANEL WITH GFR -     Hepatic function panel -     TSH  Gastroesophageal reflux disease, esophagitis presence not specified Continue PPI/H2 blocker, diet discussed  Type 2 diabetes mellitus with diabetic neuropathy, without long-term current use of insulin ([place]) Discussed general issues about diabetes pathophysiology and management., Educational material distributed., Suggested low cholesterol diet., Encouraged aerobic exercise., Discussed foot care., Reminded to get yearly retinal exam. -     Hemoglobin A1c  Controlled type 2 diabetes mellitus with stage 2 chronic kidney disease, without long-term current use of insulin ([place]) Discussed general issues about diabetes pathophysiology and management., Educational material distributed., Suggested low cholesterol diet., Encouraged aerobic exercise., Discussed foot care., Reminded to get yearly retinal exam. -     Hemoglobin A1c  Hypothyroidism, unspecified type Hypothyroidism-check TSH level, continue medications the same, reminded to take on an empty stomach 30-7mins before food.  -     Lipid panel  Peripheral sensory neuropathy -     ALPRAZolam (XANAX) 1 MG tablet; Take 1 tablet (1 mg total) by mouth at bedtime as needed. for sleep -     gabapentin (NEURONTIN) 300 MG capsule; TAKE 1 CAPSULE THREE TIMES DAILY  FOR  NEUROPATHY  PAIN  Osteoarthritis, unspecified osteoarthritis type, unspecified site  Mixed hyperlipidemia -continue medications, check lipids, decrease fatty foods, increase activity.   Morbid obesity (BMI 33.40) - long discussion about weight loss, diet, and exercise  Vitamin D deficiency Continue supplement  Medication management -     Magnesium  Glaucoma, unspecified glaucoma type,  unspecified laterality Continue eye doctor follow up  BMI 33.0-33.9,adult - long discussion about weight loss, diet, and exercise -recommended diet heavy in fruits and veggies and low in animal meats, cheeses, and dairy products  Estrogen deficiency Continue for now, may try to get off of during the winter when she is back on gabpatentin  Encounter for Medicare annual wellness exam   Plan:   During the course of the visit the patient was educated and counseled about appropriate screening and preventive services including:    Pneumococcal vaccine   Influenza vaccine  Td vaccine  Screening electrocardiogram  Screening mammography  Bone densitometry screening  Colorectal cancer screening  Diabetes screening  Glaucoma screening  Nutrition counseling   Advanced directives: given info/requested   Subjective:   Rebecca Graves is a 76 y.o. female who presents for Medicare Annual Wellness Visit and 3 month follow up on hypertension, prediabetes, hyperlipidemia, vitamin D def.   Her blood pressure has been controlled at home, today their BP is BP: 124/88 She does workout. She denies chest pain, shortness of breath, dizziness.  She is not on cholesterol medication and denies myalgias. Her cholesterol is at goal. The cholesterol last visit was:   Lab Results  Component Value Date   CHOL 199 04/07/2016   HDL 75 04/07/2016   LDLCALC 98 04/07/2016   TRIG 131 04/07/2016   CHOLHDL 2.7 04/07/2016   She has been working on diet and exercise for prediabetes, last visit she was in the DM range, admits to drinking soda, she is not on ASA and she is not on ACE, and denies paresthesia of the feet, polydipsia and polyuria. Last A1C in the office was:  Lab Results  Component Value Date   HGBA1C 6.2 (H) 04/07/2016   She has stopped her restless leg med, stopped mobic, and states she always drink a lot of water.  Lab Results  Component Value Date   GFRAA 55 (L) 04/07/2016    Patient is on Vitamin D supplement. Lab Results  Component Value Date   VD25OH 11 04/07/2016     She is on thyroid medication. Her medication was changed last visit, on 1/2  pill on F and 1 pill the other days. Patient denies nervousness, palpitations and weight changes.  Lab Results  Component Value Date   TSH 1.30 04/07/2016  .  She is on estrace and she is on 2 bASA, normal MGM in June. BMI is Body mass index is 34.61 kg/m., she is working on diet and exercise. Wt Readings from Last 3 Encounters:  07/08/16 221 lb (100.2 kg)  04/07/16 223 lb 9.6 oz (101.4 kg)  12/02/15 225 lb (102.1 kg)     Names of Other Physician/Practitioners you currently use: 1. [facility]- here for primary care 2. Dr. Peter Garter, eye doctor,has appointment coming up, visits every 3 months for glacoma, Jan 2018 3. Can't remember name, dentist, last visit 3 years ago Patient Care Team: Unk Pinto, MD as PCP - General (Internal Medicine) Camillo Flaming, [place] as Referring Physician (Optometry) Suella Broad, MD as Consulting Physician (Physical Medicine and Rehabilitation) Ladene Artist, MD as Consulting Physician (Gastroenterology) Myrlene Broker, MD as Attending Physician (Urology)  Medication Review Current Outpatient Prescriptions on File Prior to Visit  Medication Sig Dispense Refill  . ACCU-CHEK AVIVA PLUS test strip     . ALPRAZolam (XANAX) 1 MG tablet Take 1 tablet (1 mg total) by mouth at bedtime as needed. for sleep 90 tablet 1  . aspirin 81 MG tablet Take 81 mg by mouth 2 (two) times daily.     . ASSURE COMFORT LANCETS 30G MISC     . B Complex-C (SUPER B COMPLEX PO) Take 1 tablet by mouth daily.    . bisoprolol-hydrochlorothiazide (ZIAC) 10-6.25 MG tablet TAKE 1 TABLET EVERY DAY FOR BLOOD PRESSURE 90 tablet 1  . Blood Glucose Calibration (ACCU-CHEK AVIVA) SOLN     . Cholecalciferol (VITAMIN D-3) 5000 UNITS TABS Take 1 tablet by mouth daily.    Marland Kitchen  estradiol (ESTRACE) 0.5 MG tablet TAKE 1/2 TABLET ON MONDAY, WEDNESDAY AND FRIDAY AND TAKE 1 TABLET THE REST OF THE WEEK (NEED TO WEAN DOWN) (Patient taking differently: takes 1 tablet daily.) 72 tablet 1  . furosemide (LASIX) 40 MG tablet TAKE 1 TABLET EVERY DAY FOR BLOOD PRESSURE/FLUID 90 tablet 1  . Lancet Devices (ADJUSTABLE LANCING DEVICE) MISC     . latanoprost (XALATAN) 0.005 % ophthalmic solution     . levothyroxine (SYNTHROID, LEVOTHROID) 100 MCG tablet TAKE 1 TABLET EVERY DAY  BEFORE  BREAKFAST 90 tablet 2  . LUMIGAN 0.01 % SOLN Place 1 drop into both eyes at bedtime.     . Magnesium 500 MG CAPS Take 2 capsules by mouth daily.     Marland Kitchen omeprazole (PRILOSEC) 40 MG capsule TAKE 1 CAPSULE EVERY DAY  FOR  ACID  REFLUX 90 capsule 1  . Red Yeast Rice 600 MG CAPS Take 1 capsule by mouth daily.    . timolol (TIMOPTIC) 0.5 % ophthalmic solution     . traMADol (ULTRAM) 50 MG tablet Take 1 tablet (50 mg total) by mouth every 6 (six) hours  as needed. 360 tablet 1   No current facility-administered medications on file prior to visit.     Current Problems (verified) Patient Active Problem List   Diagnosis Date Noted  . T2_DM w/ Periph Sensory Neuropathy 01/18/2015  . T2_DM w/CKD 2 (GFR 69 ml/min)  (HCC) 01/18/2015  . BMI 33.0-33.9,adult 01/15/2015  . Morbid obesity (BMI 33.40) 07/03/2014  . Peripheral sensory neuropathy 07/03/2014  . Vitamin D deficiency 03/31/2013  . Medication management 03/31/2013  . DJD   . Glaucoma   . Hypothyroidism   . Hypertension   . Hyperlipidemia   . GERD (gastroesophageal reflux disease)   . Tear of lateral meniscus of knee, current 03/27/2011    Screening Tests Immunization History  Administered Date(s) Administered  . Influenza-Unspecified 12/09/2013, 11/14/2015  . Pneumococcal Conjugate-13 04/21/2015  . Pneumococcal Polysaccharide-23 05/29/2008  . Td 04/26/2007    Preventative care: Last colonoscopy: Dr. Fuller Plan on 11/2012 due 2019 Last mammogram:  07/2015 Last pap smear/pelvic exam: remote, declines DEXA: 2017 US carotid 2015  Prior vaccinations: TD or Tdap: 2009  Influenza: 2017 Pneumococcal: 2010 Prevnar 13: 2017 Shingles/Zostavax: declines due to price  Allergies No Known Allergies  SURGICAL HISTORY She  has a past surgical history that includes Abdominal hysterectomy; Appendectomy; Joint replacement (2007); Bladder suspension; and Knee arthroscopy (03/28/2011). FAMILY HISTORY Her family history includes Esophageal cancer in her other. SOCIAL HISTORY She  reports that she quit smoking about 20 years ago. She has never used smokeless tobacco. She reports that she drinks about 1.0 oz of alcohol per week . She reports that she does not use drugs.  MEDICARE WELLNESS OBJECTIVES: Physical activity: Current Exercise Habits: Structured exercise class, Type of exercise: walking;strength training/weights, Time (Minutes): 30, Frequency (Times/Week): 4, Weekly Exercise (Minutes/Week): 120, Intensity: Mild Cardiac risk factors: Cardiac Risk Factors include: advanced age (>62men, >29 women);dyslipidemia;hypertension;obesity (BMI >30kg/m2) Depression/mood screen:   Depression screen [facility] 2/9 07/08/2016  Decreased Interest 0  Down, Depressed, Hopeless 0  PHQ - 2 Score 0    ADLs:  In your present state of health, do you have any difficulty performing the following activities: 07/08/2016 04/09/2016  Hearing? N N  Vision? N N  Difficulty concentrating or making decisions? N N  Walking or climbing stairs? N N  Dressing or bathing? N N  Doing errands, shopping? N N  Some recent data might be hidden     Cognitive Testing  Alert? Yes  Normal Appearance?Yes  Oriented to person? Yes  Place? Yes   Time? Yes  Recall of three objects?  Yes  Can perform simple calculations? Yes  Displays appropriate judgment?Yes  Can read the correct time from a watch face?Yes  EOL planning: Does Patient Have a Medical Advance Directive?: Yes Type of  Advance Directive: Healthcare Power of Attorney, Living will [place] in Chart?: No - copy requested   Objective:   Blood pressure 124/88, pulse 85, temperature 97.5 F (36.4 C), resp. rate 14, height 5\' 7"  (1.702 m), weight 221 lb (100.2 kg), SpO2 97 %. Body mass index is 34.61 kg/m.  General appearance: alert, no distress, WD/WN,  female HEENT: normocephalic, sclerae anicteric, TMs pearly, nares patent, no discharge or erythema, pharynx normal Oral cavity: MMM, no lesions Neck: supple, no lymphadenopathy, no thyromegaly, no masses Heart: RRR, normal S1, S2, no murmurs Lungs: CTA bilaterally, no wheezes, rhonchi, or rales Abdomen: +bs, soft, non tender, non distended, no masses, no hepatomegaly, no splenomegaly Musculoskeletal: nontender, no swelling, no obvious deformity Extremities:  no edema, no cyanosis, no clubbing Pulses: 2+ symmetric, upper and lower extremities, normal cap refill Neurological: alert, oriented x 3, CN2-12 intact, strength normal upper extremities and lower extremities, sensation normal throughout, DTRs 2+ throughout, no cerebellar signs, gait normal Psychiatric: normal affect, behavior normal, pleasant  Breast: defer Gyn: defer Rectal: defer  Medicare Attestation I have personally reviewed: The patient's medical and social history Their use of alcohol, tobacco or illicit drugs Their current medications and supplements The patient's functional ability including ADLs,fall risks, home safety risks, cognitive, and hearing and visual impairment Diet and physical activities Evidence for depression or mood disorders  The patient's weight, height, BMI, and visual acuity have been recorded in the chart.  I have made referrals, counseling, and provided education to the patient based on review of the above and I have provided the patient with a written personalized care plan for preventive services.     Vicie Mutters, PA-C   07/08/2016

## 2016-07-08 ENCOUNTER — Encounter: Payer: Self-pay | Admitting: Physician Assistant

## 2016-07-08 ENCOUNTER — Ambulatory Visit (INDEPENDENT_AMBULATORY_CARE_PROVIDER_SITE_OTHER): Payer: Medicare HMO | Admitting: Physician Assistant

## 2016-07-08 ENCOUNTER — Ambulatory Visit: Payer: Self-pay | Admitting: Internal Medicine

## 2016-07-08 VITALS — BP 124/88 | HR 85 | Temp 97.5°F | Resp 14 | Ht 67.0 in | Wt 221.0 lb

## 2016-07-08 DIAGNOSIS — E1140 Type 2 diabetes mellitus with diabetic neuropathy, unspecified: Secondary | ICD-10-CM

## 2016-07-08 DIAGNOSIS — E559 Vitamin D deficiency, unspecified: Secondary | ICD-10-CM | POA: Diagnosis not present

## 2016-07-08 DIAGNOSIS — Z0000 Encounter for general adult medical examination without abnormal findings: Secondary | ICD-10-CM

## 2016-07-08 DIAGNOSIS — M1990 Unspecified osteoarthritis, unspecified site: Secondary | ICD-10-CM | POA: Diagnosis not present

## 2016-07-08 DIAGNOSIS — K219 Gastro-esophageal reflux disease without esophagitis: Secondary | ICD-10-CM | POA: Diagnosis not present

## 2016-07-08 DIAGNOSIS — G608 Other hereditary and idiopathic neuropathies: Secondary | ICD-10-CM

## 2016-07-08 DIAGNOSIS — E782 Mixed hyperlipidemia: Secondary | ICD-10-CM | POA: Diagnosis not present

## 2016-07-08 DIAGNOSIS — I10 Essential (primary) hypertension: Secondary | ICD-10-CM

## 2016-07-08 DIAGNOSIS — G629 Polyneuropathy, unspecified: Secondary | ICD-10-CM

## 2016-07-08 DIAGNOSIS — E039 Hypothyroidism, unspecified: Secondary | ICD-10-CM

## 2016-07-08 DIAGNOSIS — H409 Unspecified glaucoma: Secondary | ICD-10-CM | POA: Diagnosis not present

## 2016-07-08 DIAGNOSIS — E1122 Type 2 diabetes mellitus with diabetic chronic kidney disease: Secondary | ICD-10-CM | POA: Diagnosis not present

## 2016-07-08 DIAGNOSIS — Z0001 Encounter for general adult medical examination with abnormal findings: Secondary | ICD-10-CM

## 2016-07-08 DIAGNOSIS — R6889 Other general symptoms and signs: Secondary | ICD-10-CM | POA: Diagnosis not present

## 2016-07-08 DIAGNOSIS — Z6833 Body mass index (BMI) 33.0-33.9, adult: Secondary | ICD-10-CM

## 2016-07-08 DIAGNOSIS — N182 Chronic kidney disease, stage 2 (mild): Secondary | ICD-10-CM | POA: Diagnosis not present

## 2016-07-08 DIAGNOSIS — Z79899 Other long term (current) drug therapy: Secondary | ICD-10-CM | POA: Diagnosis not present

## 2016-07-08 DIAGNOSIS — E2839 Other primary ovarian failure: Secondary | ICD-10-CM

## 2016-07-08 LAB — HEPATIC FUNCTION PANEL
ALBUMIN: 4.7 g/dL (ref 3.6–5.1)
ALK PHOS: 50 U/L (ref 33–130)
ALT: 13 U/L (ref 6–29)
AST: 16 U/L (ref 10–35)
BILIRUBIN INDIRECT: 0.6 mg/dL (ref 0.2–1.2)
BILIRUBIN TOTAL: 0.8 mg/dL (ref 0.2–1.2)
Bilirubin, Direct: 0.2 mg/dL (ref <=0.2)
Total Protein: 7.4 g/dL (ref 6.1–8.1)

## 2016-07-08 LAB — CBC WITH DIFFERENTIAL/PLATELET
BASOS PCT: 1 %
Basophils Absolute: 42 cells/uL (ref 0–200)
EOS ABS: 126 {cells}/uL (ref 15–500)
Eosinophils Relative: 3 %
HEMATOCRIT: 43 % (ref 35.0–45.0)
Hemoglobin: 14.5 g/dL (ref 11.7–15.5)
Lymphocytes Relative: 57 %
Lymphs Abs: 2394 cells/uL (ref 850–3900)
MCH: 28.1 pg (ref 27.0–33.0)
MCHC: 33.7 g/dL (ref 32.0–36.0)
MCV: 83.3 fL (ref 80.0–100.0)
MONO ABS: 420 {cells}/uL (ref 200–950)
MONOS PCT: 10 %
MPV: 9.5 fL (ref 7.5–12.5)
NEUTROS ABS: 1218 {cells}/uL — AB (ref 1500–7800)
Neutrophils Relative %: 29 %
PLATELETS: 234 10*3/uL (ref 140–400)
RBC: 5.16 MIL/uL — AB (ref 3.80–5.10)
RDW: 14.4 % (ref 11.0–15.0)
WBC: 4.2 10*3/uL (ref 3.8–10.8)

## 2016-07-08 LAB — LIPID PANEL
Cholesterol: 200 mg/dL — ABNORMAL HIGH (ref <200)
HDL: 86 mg/dL (ref >50)
LDL Cholesterol: 96 mg/dL (ref <100)
Total CHOL/HDL Ratio: 2.3 Ratio (ref <5.0)
Triglycerides: 90 mg/dL (ref <150)
VLDL: 18 mg/dL (ref <30)

## 2016-07-08 LAB — BASIC METABOLIC PANEL WITH GFR
BUN: 12 mg/dL (ref 7–25)
CHLORIDE: 100 mmol/L (ref 98–110)
CO2: 29 mmol/L (ref 20–31)
Calcium: 10.3 mg/dL (ref 8.6–10.4)
Creat: 0.98 mg/dL — ABNORMAL HIGH (ref 0.60–0.93)
GFR, EST AFRICAN AMERICAN: 65 mL/min (ref >=60)
GFR, EST NON AFRICAN AMERICAN: 57 mL/min — AB (ref >=60)
Glucose, Bld: 118 mg/dL — ABNORMAL HIGH (ref 65–99)
POTASSIUM: 4.2 mmol/L (ref 3.5–5.3)
Sodium: 141 mmol/L (ref 135–146)

## 2016-07-08 LAB — TSH: TSH: 0.92 mIU/L

## 2016-07-08 MED ORDER — GABAPENTIN 300 MG PO CAPS: ORAL_CAPSULE | ORAL | 1 refills | Status: DC

## 2016-07-08 MED ORDER — ALPRAZOLAM 1 MG PO TABS: 1.0000 mg | ORAL_TABLET | Freq: Every evening | ORAL | 1 refills | Status: DC | PRN

## 2016-07-08 NOTE — Patient Instructions (Addendum)
Drink 80-100 oz of water a day  Start back on the gabapentin for your legs and back Gabapentin is for nerve pain. It can make you sleepy so we suggest trying it at night first and please plan to not drive or do anything strenuous. Also please do not take this medication with alcohol.  Start out 1 pill at night before bed, can increase to 2 pills at night before bed. Please call the office if you have any side effects.   Can take 3 pills a day however you would like  Some examples: - 1 breakfast, lunch, bedtime. - 1 at breakfast, 2 at bed time   Take 1 pill each bedtime for one week, then one pill twice daily for one week, then one pill 3 times a day thereafter. Common side effects are sleepiness, concentration problems, dizziness, swelling.   How should I use this medicine? Take this medicine by mouth with a glass of water. Follow the directions on the prescription label. You can take this medicine with or without food. Take your doses at regular intervals. Do not take your medicine more often than directed. Do not stop taking except on your doctor's advice.  What if I miss a dose? If you miss a dose, take it as soon as you can. If it is almost time for your next dose, take only that dose. Do not take double or extra doses.  What should I watch for while using this medicine? Tell your doctor or healthcare professional if your symptoms do not start to get better or if they get worse.   You may get drowsy or dizzy. Do not drive, use machinery, or do anything that needs mental alertness until you know how this medicine affects you. Do not stand or sit up quickly, especially if you are an older patient. This reduces the risk of dizzy or fainting spells. Alcohol may interfere with the effect of this medicine. Avoid alcoholic drinks. If you have a heart condition, like congestive heart failure, and notice that you are retaining water and have swelling in your hands or feet, contact your health care  provider immediately.  What side effects may I notice from receiving this medicine? Side effects that you should report to your doctor or health care professional as soon as possible and are very rare: -allergic reactions like skin rash, itching or hives, swelling of the face, lips, or tongue -breathing problems -changes in vision -jerking or unusual movements of any part of your body -suicidal thoughts or other mood changes -swelling of the ankles, feet, hands -unusual bruising or bleeding  Side effects that usually do not require medical attention (Report these to your doctor or health care professional if they continue or are bothersome.): -dizziness -drowsiness -dry mouth -nausea -tremors      GETTING OFF OF PPI's    Nexium/protonix/prilosec/Omeprazole/Dexilant/Aciphex are called PPI's, they are great at healing your stomach but should only be taken for a short period of time.     Recent studies have shown that taken for a long time they  can increase the risk of osteoporosis (weakening of your bones), pneumonia, low magnesium, restless legs, Cdiff (infection that causes diarrhea), DEMENTIA and most recently kidney damage / disease / insufficiency.     Due to this information we want to try to stop the PPI but if you try to stop it abruptly this can cause rebound acid and worsening symptoms.   So this is how we want you to get  off the PPI:  - Start taking the nexium/protonix/prilosec/PPI  every other day with  Zantac 150 or 300mg  (ranitidine) 2 x a day for 2-4 weeks - some people stay on this dosage and can not taper off further. Our main goal is to limit the dosage and amount you are taking so if you need to stay on this dose.   - then decrease the PPI to every 3 days while taking the zantac (ranitidine) 300mg  twice a day the other  days for 2-4  Weeks  - then you can try the zantac (ranitidine) 300mg  once at night or up to 2 x day as needed.  - you can continue on this  once at night or stop all together  - Avoid alcohol, spicy foods, NSAIDS (aleve, ibuprofen) at this time. See foods below.   +++++++++++++++++++++++++++++++++++++++++++  Food Choices for Gastroesophageal Reflux Disease  When you have gastroesophageal reflux disease (GERD), the foods you eat and your eating habits are very important. Choosing the right foods can help ease the discomfort of GERD. WHAT GENERAL GUIDELINES DO I NEED TO FOLLOW?  Choose fruits, vegetables, whole grains, low-fat dairy products, and low-fat meat, fish, and poultry.  Limit fats such as oils, salad dressings, butter, nuts, and avocado.  Keep a food diary to identify foods that cause symptoms.  Avoid foods that cause reflux. These may be different for different people.  Eat frequent small meals instead of three large meals each day.  Eat your meals slowly, in a relaxed setting.  Limit fried foods.  Cook foods using methods other than frying.  Avoid drinking alcohol.  Avoid drinking large amounts of liquids with your meals.  Avoid bending over or lying down until 2-3 hours after eating.   WHAT FOODS ARE NOT RECOMMENDED? The following are some foods and drinks that may worsen your symptoms:  Vegetables Tomatoes. Tomato juice. Tomato and spaghetti sauce. Chili peppers. Onion and garlic. Horseradish. Fruits Oranges, grapefruit, and lemon (fruit and juice). Meats High-fat meats, fish, and poultry. This includes hot dogs, ribs, ham, sausage, salami, and bacon. Dairy Whole milk and chocolate milk. Sour cream. Cream. Butter. Ice cream. Cream cheese.  Beverages Coffee and tea, with or without caffeine. Carbonated beverages or energy drinks. Condiments Hot sauce. Barbecue sauce.  Sweets/Desserts Chocolate and cocoa. Donuts. Peppermint and spearmint. Fats and Oils High-fat foods, including [place] fries and potato chips. Other Vinegar. Strong spices, such as black pepper, white pepper, red pepper,  cayenne, curry powder, cloves, ginger, and chili powder. Nexium/protonix/prilosec are called PPI's, they are great at healing your stomach but should only be taken for a short period of time.

## 2016-07-09 LAB — MAGNESIUM: Magnesium: 2 mg/dL (ref 1.5–2.5)

## 2016-07-09 LAB — HEMOGLOBIN A1C
HEMOGLOBIN A1C: 6.2 % — AB (ref <5.7)
Mean Plasma Glucose: 131 mg/dL

## 2016-07-12 NOTE — Progress Notes (Signed)
LVM for pt to return office call for LAB results.

## 2016-07-14 NOTE — Progress Notes (Signed)
LVM for pt to return office call for LAB results.

## 2016-07-18 ENCOUNTER — Other Ambulatory Visit: Payer: Self-pay | Admitting: Internal Medicine

## 2016-07-18 DIAGNOSIS — H2513 Age-related nuclear cataract, bilateral: Secondary | ICD-10-CM | POA: Diagnosis not present

## 2016-07-18 DIAGNOSIS — Z1231 Encounter for screening mammogram for malignant neoplasm of breast: Secondary | ICD-10-CM

## 2016-07-18 DIAGNOSIS — H35032 Hypertensive retinopathy, left eye: Secondary | ICD-10-CM | POA: Diagnosis not present

## 2016-07-18 DIAGNOSIS — H401132 Primary open-angle glaucoma, bilateral, moderate stage: Secondary | ICD-10-CM | POA: Diagnosis not present

## 2016-07-18 DIAGNOSIS — H3582 Retinal ischemia: Secondary | ICD-10-CM | POA: Diagnosis not present

## 2016-07-18 DIAGNOSIS — H25013 Cortical age-related cataract, bilateral: Secondary | ICD-10-CM | POA: Diagnosis not present

## 2016-07-19 NOTE — Progress Notes (Signed)
LVM for pt to return office call for LAB results. Lab letter w/ explanation of lab results were mailed to pt.

## 2016-08-04 DIAGNOSIS — H34212 Partial retinal artery occlusion, left eye: Secondary | ICD-10-CM | POA: Diagnosis not present

## 2016-08-04 DIAGNOSIS — H35033 Hypertensive retinopathy, bilateral: Secondary | ICD-10-CM | POA: Diagnosis not present

## 2016-08-04 DIAGNOSIS — H43813 Vitreous degeneration, bilateral: Secondary | ICD-10-CM | POA: Diagnosis not present

## 2016-08-04 DIAGNOSIS — H348122 Central retinal vein occlusion, left eye, stable: Secondary | ICD-10-CM | POA: Diagnosis not present

## 2016-08-11 ENCOUNTER — Other Ambulatory Visit: Payer: Self-pay | Admitting: Internal Medicine

## 2016-08-22 ENCOUNTER — Ambulatory Visit
Admission: RE | Admit: 2016-08-22 | Discharge: 2016-08-22 | Disposition: A | Discharge status: Home / Self Care | Payer: Commercial Managed Care - HMO | Source: Ambulatory Visit | Attending: Internal Medicine | Admitting: Internal Medicine

## 2016-08-22 DIAGNOSIS — Z1231 Encounter for screening mammogram for malignant neoplasm of breast: Secondary | ICD-10-CM | POA: Diagnosis not present

## 2016-08-30 ENCOUNTER — Other Ambulatory Visit: Payer: Self-pay | Admitting: Internal Medicine

## 2016-09-22 ENCOUNTER — Encounter: Payer: Self-pay | Admitting: Internal Medicine

## 2016-10-28 ENCOUNTER — Ambulatory Visit (INDEPENDENT_AMBULATORY_CARE_PROVIDER_SITE_OTHER): Payer: Medicare HMO | Admitting: Internal Medicine

## 2016-10-28 ENCOUNTER — Encounter: Payer: Self-pay | Admitting: Internal Medicine

## 2016-10-28 VITALS — BP 132/78 | HR 60 | Temp 97.5°F | Resp 16 | Ht 67.0 in | Wt 223.2 lb

## 2016-10-28 DIAGNOSIS — R6889 Other general symptoms and signs: Secondary | ICD-10-CM | POA: Diagnosis not present

## 2016-10-28 DIAGNOSIS — E1122 Type 2 diabetes mellitus with diabetic chronic kidney disease: Secondary | ICD-10-CM | POA: Diagnosis not present

## 2016-10-28 DIAGNOSIS — I10 Essential (primary) hypertension: Secondary | ICD-10-CM | POA: Diagnosis not present

## 2016-10-28 DIAGNOSIS — E039 Hypothyroidism, unspecified: Secondary | ICD-10-CM | POA: Diagnosis not present

## 2016-10-28 DIAGNOSIS — Z0001 Encounter for general adult medical examination with abnormal findings: Secondary | ICD-10-CM

## 2016-10-28 DIAGNOSIS — Z136 Encounter for screening for cardiovascular disorders: Secondary | ICD-10-CM | POA: Diagnosis not present

## 2016-10-28 DIAGNOSIS — E1140 Type 2 diabetes mellitus with diabetic neuropathy, unspecified: Secondary | ICD-10-CM

## 2016-10-28 DIAGNOSIS — Z1212 Encounter for screening for malignant neoplasm of rectum: Secondary | ICD-10-CM

## 2016-10-28 DIAGNOSIS — M109 Gout, unspecified: Secondary | ICD-10-CM | POA: Diagnosis not present

## 2016-10-28 DIAGNOSIS — E559 Vitamin D deficiency, unspecified: Secondary | ICD-10-CM

## 2016-10-28 DIAGNOSIS — E782 Mixed hyperlipidemia: Secondary | ICD-10-CM

## 2016-10-28 DIAGNOSIS — K219 Gastro-esophageal reflux disease without esophagitis: Secondary | ICD-10-CM

## 2016-10-28 DIAGNOSIS — Z79899 Other long term (current) drug therapy: Secondary | ICD-10-CM | POA: Diagnosis not present

## 2016-10-28 DIAGNOSIS — N182 Chronic kidney disease, stage 2 (mild): Secondary | ICD-10-CM | POA: Diagnosis not present

## 2016-10-28 LAB — URIC ACID: URIC ACID, SERUM: 6.7 mg/dL (ref 2.5–7.0)

## 2016-10-28 MED ORDER — ALPRAZOLAM 1 MG PO TABS: 1.0000 mg | ORAL_TABLET | Freq: Every evening | ORAL | 1 refills | Status: DC | PRN

## 2016-10-28 NOTE — Patient Instructions (Signed)

## 2016-10-28 NOTE — Progress Notes (Signed)
[facility] Unk Pinto, M.D.      Rebecca Graves. Silverio Lay, P.A.-[facility]                [address] [place], [place] [address] Telephone [phone] Telefax [phone]  Annual Screening/Preventative Visit & Comprehensive Evaluation &  Examination     This very nice 76 y.o. DBF presents for a Screening/Preventative Visit & comprehensive evaluation and management of multiple medical co-morbidities.  Patient has been followed for HTN, T2_NIDDM , Hyperlipidemia and Vitamin D Deficiency.      HTN predates since 1997. Patient's BP has been controlled at home and patient denies any cardiac symptoms as chest pain, palpitations, shortness of breath, dizziness or ankle swelling. Today's BPis at goal - 132/78      Patient's hyperlipidemia is controlled with diet and medications. Patient denies myalgias or other medication SE's. Last lipids were at goal: Lab Results  Component Value Date   CHOL 200 (H) 07/08/2016   HDL 86 07/08/2016   LDLCALC 96 07/08/2016   TRIG 90 07/08/2016   CHOLHDL 2.3 07/08/2016      Patient has Morbid Obesity (BMI 35+) and T2_NIDDM predating since 2013 w/CKD2 & periph sensory neuropathy and patient denies reactive hypoglycemic symptoms, visual blurring, diabetic polys, but does have paresthesias of the distal LE's.  Patient is attempting to control Last A1c was not at goal: Lab Results  Component Value Date   HGBA1C 6.2 (H) 07/08/2016      Patient has been on Thyroid Replacement circa 1988. Finally, patient has history of Vitamin D Deficiency of "17" in 2008 and last Vitamin D was at goal: Lab Results  Component Value Date   VD25OH 88 04/07/2016   Current Outpatient Prescriptions on File Prior to Visit  Medication Sig  . ACCU-CHEK AVIVA PLUS test strip   . ALPRAZolam (XANAX) 1 MG tablet Take 1 tablet (1 mg total) by mouth at bedtime as needed. for sleep  . aspirin 81 MG tablet  Take 81 mg by mouth 2 (two) times daily.   . ASSURE COMFORT LANCETS 30G MISC   . B Complex-C (SUPER B COMPLEX PO) Take 1 tablet by mouth daily.  . bisoprolol-hydrochlorothiazide (ZIAC) 10-6.25 MG tablet TAKE 1 TABLET EVERY DAY FOR BLOOD PRESSURE  . Blood Glucose Calibration (ACCU-CHEK AVIVA) SOLN   . Cholecalciferol (VITAMIN D-3) 5000 UNITS TABS Take 1 tablet by mouth daily.  Marland Kitchen estradiol (ESTRACE) 0.5 MG tablet TAKE 1/2 TABLET ON MONDAY, WEDNESDAY AND FRIDAY AND TAKE 1 TABLET THE REST OF THE WEEK (NEED TO WEAN DOWN)  . furosemide (LASIX) 40 MG tablet TAKE 1 TABLET EVERY DAY FOR BLOOD PRESSURE/FLUID  . gabapentin (NEURONTIN) 300 MG capsule TAKE 1 CAPSULE THREE TIMES DAILY  FOR  NEUROPATHY  PAIN  . Lancet Devices (ADJUSTABLE LANCING DEVICE) MISC   . levothyroxine (SYNTHROID, LEVOTHROID) 100 MCG tablet TAKE 1 TABLET EVERY DAY  BEFORE  BREAKFAST  . LUMIGAN 0.01 % SOLN Place 1 drop into both eyes at bedtime.   . Magnesium 500 MG CAPS Take 2 capsules by mouth daily.   . Red Yeast Rice 600 MG CAPS Take 1 capsule by mouth daily.  . timolol (TIMOPTIC) 0.5 % ophthalmic solution   . traMADol (ULTRAM) 50 MG tablet Take 1 tablet (50 mg total) by mouth every 6 (six) hours as needed.   No current facility-administered medications on  file prior to visit.    No Known Allergies Past Medical History:  Diagnosis Date  . Anal fissure   . Arthritis   . Colon polyps    hyperplastic  . Diverticulosis   . GERD (gastroesophageal reflux disease)   . Glaucoma   . Hemorrhoids   . Hyperlipidemia   . Hypertension   . Hypothyroidism   . Prediabetes    Health Maintenance  Topic Date Due  . URINE MICROALBUMIN  07/03/2015  . FOOT EXAM  08/23/2016  . INFLUENZA VACCINE  09/28/2016  . HEMOGLOBIN A1C  01/08/2017  . OPHTHALMOLOGY EXAM  03/23/2017  . TETANUS/TDAP  04/25/2017  . COLONOSCOPY  11/29/2017  . DEXA SCAN  Completed  . PNA vac Low Risk Adult  Completed   Immunization History  Administered Date(s)  Administered  . Influenza-Unspecified 12/09/2013, 11/14/2015  . Pneumococcal Conjugate-13 04/21/2015  . Pneumococcal Polysaccharide-23 05/29/2008  . Td 04/26/2007   Past Surgical History:  Procedure Laterality Date  . ABDOMINAL HYSTERECTOMY    . APPENDECTOMY    . BLADDER SUSPENSION    . JOINT REPLACEMENT  2007   rt hip replacement  . KNEE ARTHROSCOPY  03/28/2011   Procedure: ARTHROSCOPY KNEE;  Surgeon: Kerin Salen, MD;  Location: [facility];  Service: Orthopedics;  Laterality: Right;  with partial lateral meniscectomy, Debridement of Chondromalacia   Family History  Problem Relation Age of Onset  . Esophageal cancer Other        nefew- age 1  . Colon cancer Neg Hx   . Rectal cancer Neg Hx   . Stomach cancer Neg Hx   . Breast cancer Neg Hx    Social History  Substance Use Topics  . Smoking status: Former Smoker    Quit date: 03/22/1996  . Smokeless tobacco: Never Used  . Alcohol use 1.0 oz/week    2 drink(s) per week     Comment: occ    ROS Constitutional: Denies fever, chills, weight loss/gain, headaches, insomnia,  night sweats, and change in appetite. Does c/o fatigue. Eyes: Denies redness, blurred vision, diplopia, discharge, itchy, watery eyes.  ENT: Denies discharge, congestion, post nasal drip, epistaxis, sore throat, earache, hearing loss, dental pain, Tinnitus, Vertigo, Sinus pain, snoring.  Cardio: Denies chest pain, palpitations, irregular heartbeat, syncope, dyspnea, diaphoresis, orthopnea, PND, claudication, edema Respiratory: denies cough, dyspnea, DOE, pleurisy, hoarseness, laryngitis, wheezing.  Gastrointestinal: Denies dysphagia, heartburn, reflux, water brash, pain, cramps, nausea, vomiting, bloating, diarrhea, constipation, hematemesis, melena, hematochezia, jaundice, hemorrhoids Genitourinary: Denies dysuria, frequency, urgency, nocturia, hesitancy, discharge, hematuria, flank pain Breast: Breast lumps, nipple discharge, bleeding.   Musculoskeletal: Denies arthralgia, myalgia, stiffness, Jt. Swelling, pain, limp, and strain/sprain. Denies falls. Skin: Denies puritis, rash, hives, warts, acne, eczema, changing in skin lesion Neuro: No weakness, tremor, incoordination, spasms, paresthesia, pain Psychiatric: Denies confusion, memory loss, sensory loss. Denies Depression. Endocrine: Denies change in weight, skin, hair change, nocturia, and paresthesia, diabetic polys, visual blurring, hyper / hypo glycemic episodes.  Heme/Lymph: No excessive bleeding, bruising, enlarged lymph nodes.  Physical Exam  BP 132/78   Pulse 60   Temp (!) 97.5 F (36.4 C)   Resp 16   Ht 5\' 7"  (1.702 m)   Wt 223 lb 3.2 oz (101.2 kg)   BMI 34.96 kg/m   General Appearance: Well nourished, well groomed and in no apparent distress.  Eyes: PERRLA, EOMs, conjunctiva no swelling or erythema, normal fundi and vessels. Sinuses: No frontal/maxillary tenderness ENT/Mouth: EACs patent / TMs  nl. Nares clear  without erythema, swelling, mucoid exudates. Oral hygiene is good. No erythema, swelling, or exudate. Tongue normal, non-obstructing. Tonsils not swollen or erythematous. Hearing normal.  Neck: Supple, thyroid normal. No bruits, nodes or JVD. Respiratory: Respiratory effort normal.  BS equal and clear bilateral without rales, rhonci, wheezing or stridor. Cardio: Heart sounds are normal with regular rate and rhythm and no murmurs, rubs or gallops. Peripheral pulses are normal and equal bilaterally without edema. No aortic or femoral bruits. Chest: symmetric with normal excursions and percussion. Breasts: Symmetric, without lumps, nipple discharge, retractions, or fibrocystic changes.  Abdomen: Flat, soft with bowel sounds active. Nontender, no guarding, rebound, hernias, masses, or organomegaly.  Lymphatics: Non tender without lymphadenopathy.  Genitourinary:  Musculoskeletal: Full ROM all peripheral extremities, joint stability, 5/5 strength, and  normal gait. Skl tender over MTP jt's.  Skin: Warm and dry without rashes, lesions, cyanosis, clubbing or  ecchymosis.  Neuro: Cranial nerves intact, reflexes equal bilaterally. Normal muscle tone, no cerebellar symptoms. Sensation intact to touch, vibratory and Monofilament to the toes bilaterally. Pysch: Alert and oriented X 3, normal affect, Insight and Judgment appropriate.   Assessment and Plan  1. Annual Preventative Screening Examination  2. Essential hypertension  - EKG 12-Lead - Microalbumin / creatinine urine ratio - Urinalysis w microscopic + reflex cultur - CBC with Differential/Platelet - BASIC METABOLIC PANEL WITH GFR - Magnesium - TSH  3. Hyperlipidemia, mixed  - EKG 12-Lead - Hepatic function panel - Lipid panel  4. Controlled type 2 diabetes mellitus with stage 2 chronic kidney disease, without long-term current use of insulin (HCC)  - EKG 12-Lead - Microalbumin / creatinine urine ratio - Urinalysis w microscopic + reflex cultur - Hemoglobin A1c - Insulin, fasting  5. Vitamin D deficiency  - VITAMIN D 25 Hydroxy   6. Hypothyroidism  - TSH  7. Type 2 diabetes mellitus with diabetic neuropathy, without long-term current use of insulin (HCC)  - encouraged better diet and exercise - EKG 12-Lead - HM DIABETES FOOT EXAM - LOW EXTREMITY NEUR EXAM DOCUM - Hemoglobin A1c - Insulin, fasting  8. Gastroesophageal reflux disease, esophagitis presence not specified  9. Screening for rectal cancer  - POC Hemoccult Bld/Stl   10. Screening for ischemic heart disease  - EKG 12-Lead  11. Morbid obesity ([place])  12. Medication management  - Microalbumin / creatinine urine ratio - Urinalysis w microscopic + reflex cultur - CBC with Differential/Platelet - BASIC METABOLIC PANEL WITH GFR - Hepatic function panel - Magnesium - Lipid panel - TSH - Hemoglobin A1c - Insulin, fasting - VITAMIN D 25 Hydroxy   13. Acute gout involving toe of left foot  -  Uric acid      Patient was counseled in prudent diet to achieve/maintain BMI less than 25 for weight control, BP monitoring, regular exercise and medications. Discussed med's effects and SE's. Screening labs and tests as requested with regular follow-up as recommended. Over 40 minutes of exam, counseling, chart review and high complex critical decision making was performed.

## 2016-10-29 LAB — CBC WITH DIFFERENTIAL/PLATELET
Basophils Absolute: 51 cells/uL (ref 0–200)
Basophils Relative: 1.3 %
EOS PCT: 4.1 %
Eosinophils Absolute: 160 cells/uL (ref 15–500)
HEMATOCRIT: 41 % (ref 35.0–45.0)
HEMOGLOBIN: 13.9 g/dL (ref 11.7–15.5)
LYMPHS ABS: 1868 {cells}/uL (ref 850–3900)
MCH: 27.9 pg (ref 27.0–33.0)
MCHC: 33.9 g/dL (ref 32.0–36.0)
MCV: 82.2 fL (ref 80.0–100.0)
MPV: 10.6 fL (ref 7.5–12.5)
Monocytes Relative: 11.7 %
NEUTROS ABS: 1365 {cells}/uL — AB (ref 1500–7800)
Neutrophils Relative %: 35 %
Platelets: 270 10*3/uL (ref 140–400)
RBC: 4.99 10*6/uL (ref 3.80–5.10)
RDW: 13.5 % (ref 11.0–15.0)
Total Lymphocyte: 47.9 %
WBC: 3.9 10*3/uL (ref 3.8–10.8)
WBCMIX: 456 {cells}/uL (ref 200–950)

## 2016-10-29 LAB — NO CULTURE INDICATED

## 2016-10-29 LAB — URINALYSIS W MICROSCOPIC + REFLEX CULTURE
BACTERIA UA: NONE SEEN /HPF
Bilirubin Urine: NEGATIVE
Glucose, UA: NEGATIVE
HGB URINE DIPSTICK: NEGATIVE
HYALINE CAST: NONE SEEN /LPF
Ketones, ur: NEGATIVE
Leukocyte Esterase: NEGATIVE
Nitrites, Initial: NEGATIVE
PROTEIN: NEGATIVE
RBC / HPF: NONE SEEN /HPF (ref 0–2)
Specific Gravity, Urine: 1.008 (ref 1.001–1.03)
Squamous Epithelial / LPF: NONE SEEN /HPF (ref <=5)
WBC UA: NONE SEEN /HPF (ref 0–5)
pH: 5.5 (ref 5.0–8.0)

## 2016-10-29 LAB — HEPATIC FUNCTION PANEL
AG RATIO: 1.7 (calc) (ref 1.0–2.5)
ALBUMIN MSPROF: 4.4 g/dL (ref 3.6–5.1)
ALKALINE PHOSPHATASE (APISO): 51 U/L (ref 33–130)
ALT: 13 U/L (ref 6–29)
AST: 15 U/L (ref 10–35)
Bilirubin, Direct: 0.2 mg/dL (ref 0.0–0.2)
Globulin: 2.6 g/dL (calc) (ref 1.9–3.7)
Indirect Bilirubin: 0.6 mg/dL (calc) (ref 0.2–1.2)
TOTAL PROTEIN: 7 g/dL (ref 6.1–8.1)
Total Bilirubin: 0.8 mg/dL (ref 0.2–1.2)

## 2016-10-29 LAB — BASIC METABOLIC PANEL WITH GFR
BUN/Creatinine Ratio: 12 (calc) (ref 6–22)
BUN: 14 mg/dL (ref 7–25)
CO2: 28 mmol/L (ref 20–32)
CREATININE: 1.16 mg/dL — AB (ref 0.60–0.93)
Calcium: 10 mg/dL (ref 8.6–10.4)
Chloride: 101 mmol/L (ref 98–110)
GFR, Est African American: 53 mL/min/{1.73_m2} — ABNORMAL LOW (ref >=60)
GFR, Est Non African American: 46 mL/min/{1.73_m2} — ABNORMAL LOW (ref >=60)
GLUCOSE: 126 mg/dL — AB (ref 65–99)
Potassium: 3.5 mmol/L (ref 3.5–5.3)
SODIUM: 140 mmol/L (ref 135–146)

## 2016-10-29 LAB — LIPID PANEL
CHOL/HDL RATIO: 2.2 (calc) (ref <5.0)
CHOLESTEROL: 179 mg/dL (ref <200)
HDL: 80 mg/dL (ref >50)
LDL Cholesterol (Calc): 82 mg/dL (calc)
Non-HDL Cholesterol (Calc): 99 mg/dL (calc) (ref <130)
Triglycerides: 89 mg/dL (ref <150)

## 2016-10-29 LAB — VITAMIN D 25 HYDROXY (VIT D DEFICIENCY, FRACTURES): Vit D, 25-Hydroxy: 84 ng/mL (ref 30–100)

## 2016-10-29 LAB — HEMOGLOBIN A1C
EAG (MMOL/L): 7.4 (calc)
HEMOGLOBIN A1C: 6.3 %{Hb} — AB (ref <5.7)
MEAN PLASMA GLUCOSE: 134 (calc)

## 2016-10-29 LAB — MICROALBUMIN / CREATININE URINE RATIO
Creatinine, Urine: 25 mg/dL (ref 20–320)
Microalb, Ur: <0.2 mg/dL

## 2016-10-29 LAB — MAGNESIUM: Magnesium: 2 mg/dL (ref 1.5–2.5)

## 2016-10-29 LAB — TSH: TSH: 3.69 mIU/L (ref 0.40–4.50)

## 2016-11-01 LAB — INSULIN, FASTING: Insulin: 12.6 u[IU]/mL (ref 2.0–19.6)

## 2016-11-08 DIAGNOSIS — H401132 Primary open-angle glaucoma, bilateral, moderate stage: Secondary | ICD-10-CM | POA: Diagnosis not present

## 2016-11-13 ENCOUNTER — Other Ambulatory Visit: Payer: Self-pay | Admitting: Internal Medicine

## 2016-11-17 ENCOUNTER — Other Ambulatory Visit: Payer: Self-pay | Admitting: Internal Medicine

## 2016-12-14 ENCOUNTER — Other Ambulatory Visit: Payer: Self-pay

## 2016-12-14 DIAGNOSIS — Z1212 Encounter for screening for malignant neoplasm of rectum: Secondary | ICD-10-CM

## 2016-12-14 LAB — POC HEMOCCULT BLD/STL (HOME/3-CARD/SCREEN)
Card #2 Fecal Occult Blod, POC: NEGATIVE
FECAL OCCULT BLD: NEGATIVE
FECAL OCCULT BLD: NEGATIVE

## 2017-01-17 ENCOUNTER — Other Ambulatory Visit: Payer: Self-pay | Admitting: Internal Medicine

## 2017-02-07 ENCOUNTER — Ambulatory Visit: Payer: Self-pay | Admitting: Adult Health

## 2017-04-10 NOTE — Progress Notes (Signed)
FOLLOW UP  Assessment and Plan:   Hypertension Well controlled with current medications  Monitor blood pressure at home; patient to call if consistently greater than 130/80 Continue DASH diet.   Reminder to go to the ER if any CP, SOB, nausea, dizziness, severe HA, changes vision/speech, left arm numbness and tingling and jaw pain.  Cholesterol Currently at goal; continue with lifestyle and red rice yeast supplement Continue low cholesterol diet and exercise.  Check lipid panel.   T2DM controlled with CKD 2 Discussed risks of elevated glucose - recent A1Cs in prediabetic range by lifestyle modification Continue diet and exercise.  Has had eye exam; report requested today  Perform daily foot/skin check, notify office of any concerning changes.  Check A1C  Obesity with co morbidities Long discussion about weight loss, diet, and exercise Recommended diet heavy in fruits and veggies and low in animal meats, cheeses, and dairy products, appropriate calorie intake Discussed ideal weight for height - she would like to lose 40 lb, next visit goal 215 lb Will follow up in 3 months  Vitamin D Def At goal at last visit; continue supplementation to maintain goal of 70-100 Defer Vit D level  Insomnia - good sleep hygiene discussed, increase day time activity, take gabapentin at night and helps reduce use of xanax.   Continue diet and meds as discussed. Further disposition pending results of labs. Discussed med's effects and SE's.   Over 30 minutes of exam, counseling, chart review, and critical decision making was performed.   Future Appointments  Date Time Provider [place]  07/17/2017 11:00 AM Unk Pinto, MD [facility] None  12/04/2017 10:00 AM Unk Pinto, MD [facility] None    ----------------------------------------------------------------------------------------------------------------------  HPI 77 y.o. female  presents for 3 month follow up on  hypertension, cholesterol, T2 diabetes controlled by lifestyle, CKD 2, obesity and vitamin D deficiency.   She is currently prescribed xanax 1 mg daily for sleep; she reports she has not taken ultram in several months. Will discontinue today. She reports she uses xanax 1-2 times a week for sleep if she has had several nights in a row with difficulty falling asleep.   BMI is Body mass index is 34.46 kg/m., she has not been working on diet and exercise. Wt Readings from Last 3 Encounters:  04/11/17 220 lb (99.8 kg)  10/28/16 223 lb 3.2 oz (101.2 kg)  07/08/16 221 lb (100.2 kg)   Her blood pressure has been controlled at home, today their BP is BP: 110/72  She does not workout. She denies chest pain, shortness of breath, dizziness.   She is not on cholesterol medication (on red rice yeast supplement) and denies myalgias. Her cholesterol is at goal. The cholesterol last visit was:   Lab Results  Component Value Date   CHOL 179 10/28/2016   HDL 80 10/28/2016   LDLCALC 96 07/08/2016   TRIG 89 10/28/2016   CHOLHDL 2.2 10/28/2016    She has not been working on diet and exercise for prediabetes, and denies increased appetite, nausea,  polydipsia, polyuria, visual disturbances, vomiting and weight loss. She does have neuropathy of bilateral feet notably at night, takes gabapentin and topical OTC creams for this and reports is managed well at this time. Last A1C in the office was:  Lab Results  Component Value Date   HGBA1C 6.3 (H) 10/28/2016   Patient is on Vitamin D supplement and at goal at last check:    Lab Results  Component Value Date   VD25OH  84 10/28/2016        Current Medications:  Current Outpatient Medications on File Prior to Visit  Medication Sig  . ACCU-CHEK AVIVA PLUS test strip   . ALPRAZolam (XANAX) 1 MG tablet Take 1 tablet (1 mg total) by mouth at bedtime as needed. for sleep  . aspirin 81 MG tablet Take 81 mg by mouth daily.   . ASSURE COMFORT LANCETS 30G MISC    . bisoprolol-hydrochlorothiazide (ZIAC) 10-6.25 MG tablet TAKE 1 TABLET EVERY DAY FOR BLOOD PRESSURE  . Blood Glucose Calibration (ACCU-CHEK AVIVA) SOLN   . Cholecalciferol (VITAMIN D-3) 5000 UNITS TABS Take 1 tablet by mouth daily.  Marland Kitchen estradiol (ESTRACE) 0.5 MG tablet TAKE 1/2 TABLET ON MONDAY, WEDNESDAY AND FRIDAY AND TAKE 1 TABLET THE REST OF THE WEEK (NEED TO WEAN DOWN)  . furosemide (LASIX) 40 MG tablet TAKE 1 TABLET EVERY DAY FOR BLOOD PRESSURE/FLUID  . gabapentin (NEURONTIN) 300 MG capsule TAKE 1 CAPSULE THREE TIMES DAILY  FOR  NEUROPATHY  PAIN  . Lancet Devices (ADJUSTABLE LANCING DEVICE) MISC   . levothyroxine (SYNTHROID, LEVOTHROID) 100 MCG tablet TAKE 1 TABLET EVERY DAY  BEFORE  BREAKFAST  . LUMIGAN 0.01 % SOLN Place 1 drop into both eyes at bedtime.   . Magnesium 500 MG CAPS Take 2 capsules by mouth daily.   . ranitidine (ZANTAC) 150 MG tablet Take 150 mg by mouth 2 (two) times daily. Takes 2 tablets in the AM and 1 in the PM, if needed.  . Red Yeast Rice 600 MG CAPS Take 1 capsule by mouth daily.  . timolol (TIMOPTIC) 0.5 % ophthalmic solution   . B Complex-C (SUPER B COMPLEX PO) Take 1 tablet by mouth daily.   No current facility-administered medications on file prior to visit.      Allergies: No Known Allergies   Medical History:  Past Medical History:  Diagnosis Date  . Anal fissure   . Arthritis   . Colon polyps    hyperplastic  . Diverticulosis   . GERD (gastroesophageal reflux disease)   . Glaucoma   . Hemorrhoids   . Hyperlipidemia   . Hypertension   . Hypothyroidism   . Prediabetes    Family history- Reviewed and unchanged Social history- Reviewed and unchanged   Review of Systems:  Review of Systems  Constitutional: Negative for malaise/fatigue and weight loss.  HENT: Negative for hearing loss and tinnitus.   Eyes: Negative for blurred vision and double vision.  Respiratory: Negative for cough, shortness of breath and wheezing.   Cardiovascular:  Negative for chest pain, palpitations, orthopnea, claudication and leg swelling.  Gastrointestinal: Negative for abdominal pain, blood in stool, constipation, diarrhea, heartburn, melena, nausea and vomiting.  Genitourinary: Negative.   Musculoskeletal: Negative for joint pain and myalgias.  Skin: Negative for rash.  Neurological: Negative for dizziness, tingling, sensory change, weakness and headaches.  Endo/Heme/Allergies: Negative for polydipsia.  Psychiatric/Behavioral: Negative.   All other systems reviewed and are negative.   Physical Exam: BP 110/72   Pulse (!) 56   Temp 97.7 F (36.5 C)   Ht 5\' 7"  (1.702 m)   Wt 220 lb (99.8 kg)   SpO2 98%   BMI 34.46 kg/m  Wt Readings from Last 3 Encounters:  04/11/17 220 lb (99.8 kg)  10/28/16 223 lb 3.2 oz (101.2 kg)  07/08/16 221 lb (100.2 kg)   General Appearance: Well nourished, in no apparent distress. Eyes: PERRLA, EOMs, conjunctiva no swelling or erythema Sinuses: No Frontal/maxillary tenderness ENT/Mouth:  Ext aud canals clear, TMs without erythema, bulging. No erythema, swelling, or exudate on post pharynx.  Tonsils not swollen or erythematous. Hearing normal.  Neck: Supple, thyroid normal.  Respiratory: Respiratory effort normal, BS equal bilaterally without rales, rhonchi, wheezing or stridor.  Cardio: RRR with no MRGs. Brisk peripheral pulses without edema.  Abdomen: Soft, + BS.  Non tender, no guarding, rebound, hernias, masses. Lymphatics: Non tender without lymphadenopathy.  Musculoskeletal: Full ROM, 5/5 strength, Normal gait Skin: Warm, dry without rashes, lesions, ecchymosis.  Neuro: Cranial nerves intact. No cerebellar symptoms.  Psych: Awake and oriented X 3, normal affect, Insight and Judgment appropriate.    Izora Ribas, NP 11:22 AM [facility]

## 2017-04-11 ENCOUNTER — Ambulatory Visit (INDEPENDENT_AMBULATORY_CARE_PROVIDER_SITE_OTHER): Payer: Medicare HMO | Admitting: Adult Health

## 2017-04-11 ENCOUNTER — Encounter: Payer: Self-pay | Admitting: Adult Health

## 2017-04-11 VITALS — BP 110/72 | HR 56 | Temp 97.7°F | Ht 67.0 in | Wt 220.0 lb

## 2017-04-11 DIAGNOSIS — E669 Obesity, unspecified: Secondary | ICD-10-CM | POA: Diagnosis not present

## 2017-04-11 DIAGNOSIS — E782 Mixed hyperlipidemia: Secondary | ICD-10-CM

## 2017-04-11 DIAGNOSIS — E1122 Type 2 diabetes mellitus with diabetic chronic kidney disease: Secondary | ICD-10-CM | POA: Diagnosis not present

## 2017-04-11 DIAGNOSIS — G4700 Insomnia, unspecified: Secondary | ICD-10-CM | POA: Diagnosis not present

## 2017-04-11 DIAGNOSIS — N182 Chronic kidney disease, stage 2 (mild): Secondary | ICD-10-CM | POA: Diagnosis not present

## 2017-04-11 DIAGNOSIS — Z79899 Other long term (current) drug therapy: Secondary | ICD-10-CM | POA: Diagnosis not present

## 2017-04-11 DIAGNOSIS — I10 Essential (primary) hypertension: Secondary | ICD-10-CM

## 2017-04-11 DIAGNOSIS — E559 Vitamin D deficiency, unspecified: Secondary | ICD-10-CM

## 2017-04-11 NOTE — Patient Instructions (Signed)
Aim for 7+ servings of fruits and vegetables daily  80+ fluid ounces of water or unsweet tea for healthy kidneys  Limit to 1 drink of alcohol per day  Limit animal fats in diet for cholesterol and heart health - choose grass fed whenever available  Aim for low stress - take time to unwind and care for your mental health  Aim for 150 min of moderate intensity exercise weekly for heart health, and weights twice weekly for bone health  Aim for 7-9 hours of sleep daily  Ways to cut 100 calories  1. Eat your eggs with hot sauce OR salsa instead of cheese.  Eggs are great for breakfast, but many people consider eggs and cheese to be BFFs. Instead of cheese-1 oz. of cheddar has 114 calories-top your eggs with hot sauce, which contains no calories and helps with satiety and metabolism. Salsa is also a great option!!  2. Top your toast, waffles or pancakes with mashed berries instead of jelly or syrup. Half a cup of berries-fresh, frozen or thawed-has about 40 calories, compared with 2 tbsp. of maple syrup or jelly, which both have about 100 calories. The berries will also give you a good punch of fiber, which helps keep you full and satisfied and won't spike blood sugar quickly like the jelly or syrup. 3. Swap the non-fat latte for black coffee with a splash of half-and-half. Contrary to its name, that non-fat latte has 130 calories and a startling 19g of carbohydrates per 16 oz. serving. Replacing that 'light' drinkable dessert with a black coffee with a splash of half-and-half saves you more than 100 calories per 16 oz. serving. 4. Sprinkle salads with freeze-dried raspberries instead of dried cranberries. If you want a sweet addition to your nutritious salad, stay away from dried cranberries. They have a whopping 130 calories per  cup and 30g carbohydrates. Instead, sprinkle freeze-dried raspberries guilt-free and save more than 100 calories per  cup serving, adding 3g of belly-filling fiber. 5.  Go for mustard in place of mayo on your sandwich. Mustard can add really nice flavor to any sandwich, and there are tons of varieties, from spicy to honey. A serving of mayo is 95 calories, versus 10 calories in a serving of mustard. 6. Choose a DIY salad dressing instead of the store-bought kind. Mix Dijon or whole grain mustard with low-fat Kefir or red wine vinegar and garlic. 7. Use hummus as a spread instead of a dip. Use hummus as a spread on a high-fiber cracker or tortilla with a sandwich and save on calories without sacrificing taste. 8. Pick just one salad "accessory." Salad isn't automatically a calorie winner. It's easy to over-accessorize with toppings. Instead of topping your salad with nuts, avocado and cranberries (all three will clock in at 313 calories), just pick one. The next day, choose a different accessory, which will also keep your salad interesting. You don't wear all your jewelry every day, right? 9. Ditch the white pasta in favor of spaghetti squash. One cup of cooked spaghetti squash has about 40 calories, compared with traditional spaghetti, which comes with more than 200. Spaghetti squash is also nutrient-dense. It's a good source of fiber and Vitamins A and C, and it can be eaten just like you would eat pasta-with a great tomato sauce and [place] meatballs or with pesto, tofu and spinach, for example. 10. Dress up your chili, soups and stews with non-fat Greek yogurt instead of sour cream. Just a 'dollop' of sour cream  can set you back 115 calories and a whopping 12g of fat-seven of which are of the artery-clogging variety. Added bonus: [place] yogurt is packed with muscle-building protein, calcium and B Vitamins. 11. Mash cauliflower instead of mashed potatoes. One cup of traditional mashed potatoes-in all their creamy goodness-has more than 200 calories, compared to mashed cauliflower, which you can typically eat for less than 100 calories per 1 cup serving. Cauliflower is  a great source of the antioxidant indole-3-carbinol (I3C), which may help reduce the risk of some cancers, like breast cancer. 12. Ditch the ice cream sundae in favor of a [place] yogurt parfait. Instead of a cup of ice cream or fro-yo for dessert, try 1 cup of nonfat Greek yogurt topped with fresh berries and a sprinkle of cacao nibs. Both toppings are packed with antioxidants, which can help reduce cellular inflammation and oxidative damage. And the comparison is a no-brainer: One cup of ice cream has about 275 calories; one cup of frozen yogurt has about 230; and a cup of Greek yogurt has just 130, plus twice the protein, so you're less likely to return to the freezer for a second helping. 13. Put olive oil in a spray container instead of using it directly from the bottle. Each tablespoon of olive oil is 120 calories and 15g of fat. Use a mister instead of pouring it straight into the pan or onto a salad. This allows for portion control and will save you more than 100 calories. 14. When baking, substitute canned pumpkin for butter or oil. Canned pumpkin-not pumpkin pie mix-is loaded with Vitamin A, which is important for skin and eye health, as well as immunity. And the comparisons are pretty crazy:  cup of canned pumpkin has about 40 calories, compared to butter or oil, which has more than 800 calories. Yes, 800 calories. Applesauce and mashed banana can also serve as good substitutions for butter or oil, usually in a 1:1 ratio. 15. Top casseroles with high-fiber cereal instead of breadcrumbs. Breadcrumbs are typically made with white bread, while breakfast cereals contain 5-9g of fiber per serving. Not only will you save more than 150 calories per  cup serving, the swap will also keep you more full and you'll get a metabolism boost from the added fiber. 16. Snack on pistachios instead of macadamia nuts. Believe it or not, you get the same amount of calories from 35 pistachios (100 calories) as you  would from only five macadamia nuts. 17. Chow down on kale chips rather than potato chips. This is my favorite 'don't knock it 'till you try it' swap. Kale chips are so easy to make at home, and you can spice them up with a little grated parmesan or chili powder. Plus, they're a mere fraction of the calories of potato chips, but with the same crunch factor we crave so often. 18. Add seltzer and some fruit slices to your cocktail instead of soda or fruit juice. One cup of soda or fruit juice can pack on as much as 140 calories. Instead, use seltzer and fruit slices. The fruit provides valuable phytochemicals, such as flavonoids and anthocyanins, which help to combat cancer and stave off the aging process.

## 2017-04-12 LAB — BASIC METABOLIC PANEL WITH GFR
BUN / CREAT RATIO: 15 (calc) (ref 6–22)
BUN: 17 mg/dL (ref 7–25)
CO2: 31 mmol/L (ref 20–32)
CREATININE: 1.16 mg/dL — AB (ref 0.60–0.93)
Calcium: 11.3 mg/dL — ABNORMAL HIGH (ref 8.6–10.4)
Chloride: 99 mmol/L (ref 98–110)
GFR, EST AFRICAN AMERICAN: 53 mL/min/{1.73_m2} — AB (ref >=60)
GFR, EST NON AFRICAN AMERICAN: 46 mL/min/{1.73_m2} — AB (ref >=60)
Glucose, Bld: 137 mg/dL — ABNORMAL HIGH (ref 65–99)
Potassium: 4.2 mmol/L (ref 3.5–5.3)
SODIUM: 140 mmol/L (ref 135–146)

## 2017-04-12 LAB — HEPATIC FUNCTION PANEL
AG RATIO: 1.4 (calc) (ref 1.0–2.5)
ALKALINE PHOSPHATASE (APISO): 56 U/L (ref 33–130)
ALT: 12 U/L (ref 6–29)
AST: 16 U/L (ref 10–35)
Albumin: 4.6 g/dL (ref 3.6–5.1)
BILIRUBIN INDIRECT: 0.8 mg/dL (ref 0.2–1.2)
BILIRUBIN TOTAL: 1 mg/dL (ref 0.2–1.2)
Bilirubin, Direct: 0.2 mg/dL (ref 0.0–0.2)
GLOBULIN: 3.2 g/dL (ref 1.9–3.7)
Total Protein: 7.8 g/dL (ref 6.1–8.1)

## 2017-04-12 LAB — LIPID PANEL
CHOL/HDL RATIO: 2.8 (calc) (ref <5.0)
Cholesterol: 219 mg/dL — ABNORMAL HIGH (ref <200)
HDL: 77 mg/dL (ref >50)
LDL Cholesterol (Calc): 111 mg/dL (calc) — ABNORMAL HIGH
NON-HDL CHOLESTEROL (CALC): 142 mg/dL — AB (ref <130)
Triglycerides: 185 mg/dL — ABNORMAL HIGH (ref <150)

## 2017-04-12 LAB — CBC WITH DIFFERENTIAL/PLATELET
BASOS ABS: 41 {cells}/uL (ref 0–200)
Basophils Relative: 0.9 %
EOS ABS: 140 {cells}/uL (ref 15–500)
Eosinophils Relative: 3.1 %
HEMATOCRIT: 43.4 % (ref 35.0–45.0)
Hemoglobin: 15 g/dL (ref 11.7–15.5)
Lymphs Abs: 1823 cells/uL (ref 850–3900)
MCH: 27.9 pg (ref 27.0–33.0)
MCHC: 34.6 g/dL (ref 32.0–36.0)
MCV: 80.8 fL (ref 80.0–100.0)
MONOS PCT: 11.3 %
MPV: 10.4 fL (ref 7.5–12.5)
NEUTROS ABS: 1989 {cells}/uL (ref 1500–7800)
Neutrophils Relative %: 44.2 %
Platelets: 245 10*3/uL (ref 140–400)
RBC: 5.37 10*6/uL — ABNORMAL HIGH (ref 3.80–5.10)
RDW: 13.2 % (ref 11.0–15.0)
Total Lymphocyte: 40.5 %
WBC: 4.5 10*3/uL (ref 3.8–10.8)
WBCMIX: 509 {cells}/uL (ref 200–950)

## 2017-04-12 LAB — HEMOGLOBIN A1C
EAG (MMOL/L): 7.6 (calc)
HEMOGLOBIN A1C: 6.4 %{Hb} — AB (ref <5.7)
MEAN PLASMA GLUCOSE: 137 (calc)

## 2017-04-12 LAB — TSH: TSH: 2.2 mIU/L (ref 0.40–4.50)

## 2017-04-13 LAB — HM DIABETES EYE EXAM

## 2017-04-27 ENCOUNTER — Other Ambulatory Visit: Payer: Self-pay | Admitting: Physician Assistant

## 2017-04-27 ENCOUNTER — Other Ambulatory Visit: Payer: Self-pay | Admitting: Internal Medicine

## 2017-05-17 ENCOUNTER — Ambulatory Visit: Payer: Self-pay | Admitting: Internal Medicine

## 2017-06-24 ENCOUNTER — Other Ambulatory Visit: Payer: Self-pay | Admitting: Internal Medicine

## 2017-07-17 ENCOUNTER — Encounter: Payer: Self-pay | Admitting: Internal Medicine

## 2017-07-17 ENCOUNTER — Ambulatory Visit (INDEPENDENT_AMBULATORY_CARE_PROVIDER_SITE_OTHER): Payer: Medicare HMO | Admitting: Internal Medicine

## 2017-07-17 VITALS — BP 102/64 | HR 64 | Temp 97.1°F | Resp 16 | Ht 67.0 in | Wt 225.6 lb

## 2017-07-17 DIAGNOSIS — M25511 Pain in right shoulder: Secondary | ICD-10-CM

## 2017-07-17 DIAGNOSIS — Z79899 Other long term (current) drug therapy: Secondary | ICD-10-CM

## 2017-07-17 DIAGNOSIS — G8929 Other chronic pain: Secondary | ICD-10-CM | POA: Diagnosis not present

## 2017-07-17 DIAGNOSIS — I10 Essential (primary) hypertension: Secondary | ICD-10-CM | POA: Diagnosis not present

## 2017-07-17 DIAGNOSIS — E039 Hypothyroidism, unspecified: Secondary | ICD-10-CM

## 2017-07-17 DIAGNOSIS — E559 Vitamin D deficiency, unspecified: Secondary | ICD-10-CM | POA: Diagnosis not present

## 2017-07-17 DIAGNOSIS — E782 Mixed hyperlipidemia: Secondary | ICD-10-CM | POA: Diagnosis not present

## 2017-07-17 DIAGNOSIS — N182 Chronic kidney disease, stage 2 (mild): Secondary | ICD-10-CM

## 2017-07-17 DIAGNOSIS — M25561 Pain in right knee: Secondary | ICD-10-CM | POA: Diagnosis not present

## 2017-07-17 DIAGNOSIS — E1122 Type 2 diabetes mellitus with diabetic chronic kidney disease: Secondary | ICD-10-CM | POA: Diagnosis not present

## 2017-07-17 NOTE — Progress Notes (Signed)
This very nice 77 y.o. DBF presents for 6 month follow up with HTN, HLD, T2_DM and Vitamin D Deficiency.      Today, she also relates Rt knee pains x 1 year, Rt shoulder pains x 2-3 months and c/o pain & swelling of bilat dorsal mid feet. She had a normal Uric acid 09/2016. She has hx/o Rt THR in 2008 by Dr Mayer Camel .      Patient is treated for HTN (1997)  & BP has been controlled at home. Today's BP is at goal - 102/64. Patient has had no complaints of any cardiac type chest pain, palpitations, dyspnea / orthopnea / PND, dizziness, claudication, or dependent edema.     Hyperlipidemia is controlled with diet & meds. Patient denies myalgias or other med SE's. Last Lipids were not at goal: Lab Results  Component Value Date   CHOL 219 (H) 04/11/2017   HDL 77 04/11/2017   LDLCALC 111 (H) 04/11/2017   TRIG 185 (H) 04/11/2017   CHOLHDL 2.8 04/11/2017      Also, the patient has history of Morbid Obesity (BMI 35+) and T2_NIDDM (2013) and she has Peripheral sensory neuropathy with painful dysthesias of her LE's.  She denies symptoms of reactive hypoglycemia, diabetic polys  or visual blurring.  Last A1c was not at goal: Lab Results  Component Value Date   HGBA1C 6.4 (H) 04/11/2017      Patient has been on Thyroid Replacement since 1988.            Further, the patient also has history of Vitamin D Deficiency ("26"/2008)  and supplements vitamin D without any suspected side-effects. Last vitamin D was at goal:  Lab Results  Component Value Date   VD25OH 84 10/28/2016   Current Outpatient Medications on File Prior to Visit  Medication Sig  . ACCU-CHEK AVIVA PLUS test strip   . ALPRAZolam (XANAX) 1 MG tablet TAKE 1 TABLET AT BEDTIME AS NEEDED FOR SLEEP  . aspirin 81 MG tablet Take 81 mg by mouth daily.   . ASSURE COMFORT LANCETS 30G MISC   . B Complex-C (SUPER B COMPLEX PO) Take 1 tablet by mouth daily.  . bisoprolol-hydrochlorothiazide (ZIAC) 10-6.25 MG tablet TAKE 1 TABLET EVERY DAY FOR  BLOOD PRESSURE  . Blood Glucose Calibration (ACCU-CHEK AVIVA) SOLN   . Cholecalciferol (VITAMIN D-3) 5000 UNITS TABS Take 1 tablet by mouth daily.  Marland Kitchen estradiol (ESTRACE) 0.5 MG tablet TAKE 1/2 TABLET ON MONDAY, WEDNESDAY AND FRIDAY AND TAKE 1 TABLET THE REST OF THE WEEK (NEED TO WEAN DOWN) (Patient taking differently: No sig reported)  . furosemide (LASIX) 40 MG tablet TAKE 1 TABLET EVERY DAY FOR BLOOD PRESSURE/FLUID  . gabapentin (NEURONTIN) 300 MG capsule TAKE 1 CAPSULE THREE TIMES DAILY  FOR  NEUROPATHY  PAIN  . Lancet Devices (ADJUSTABLE LANCING DEVICE) MISC   . latanoprost (XALATAN) 0.005 % ophthalmic solution Place 1 drop into the right eye daily.  Marland Kitchen levothyroxine (SYNTHROID, LEVOTHROID) 100 MCG tablet TAKE 1 TABLET EVERY DAY  BEFORE  BREAKFAST  . Magnesium 500 MG CAPS Take 2 capsules by mouth daily.   . ranitidine (ZANTAC) 150 MG tablet Take 150 mg by mouth 2 (two) times daily. Takes 2 tablets in the AM and 1 in the PM, if needed.  . Red Yeast Rice 600 MG CAPS Take 1 capsule by mouth daily.  . timolol (TIMOPTIC) 0.5 % ophthalmic solution Place 1 drop into the right eye 2 (two) times daily.  No current facility-administered medications on file prior to visit.    No Known Allergies PMHx:   Past Medical History:  Diagnosis Date  . Anal fissure   . Arthritis   . Colon polyps    hyperplastic  . Diverticulosis   . GERD (gastroesophageal reflux disease)   . Glaucoma   . Hemorrhoids   . Hyperlipidemia   . Hypertension   . Hypothyroidism   . Prediabetes    Immunization History  Administered Date(s) Administered  . Influenza-Unspecified 12/09/2013, 11/14/2015, 11/23/2016  . Pneumococcal Conjugate-13 04/21/2015  . Pneumococcal Polysaccharide-23 05/29/2008  . Td 04/26/2007   Past Surgical History:  Procedure Laterality Date  . ABDOMINAL HYSTERECTOMY    . APPENDECTOMY    . BLADDER SUSPENSION    . JOINT REPLACEMENT  2007   rt hip replacement  . KNEE ARTHROSCOPY  03/28/2011    Procedure: ARTHROSCOPY KNEE;  Surgeon: Kerin Salen, MD;  Location: [place];  Service: Orthopedics;  Laterality: Right;  with partial lateral meniscectomy, Debridement of Chondromalacia   FHx:    Reviewed / unchanged  SHx:    Reviewed / unchanged  Systems Review:  Constitutional: Denies fever, chills, wt changes, headaches, insomnia, fatigue, night sweats, change in appetite. Eyes: Denies redness, blurred vision, diplopia, discharge, itchy, watery eyes.  ENT: Denies discharge, congestion, post nasal drip, epistaxis, sore throat, earache, hearing loss, dental pain, tinnitus, vertigo, sinus pain, snoring.  CV: Denies chest pain, palpitations, irregular heartbeat, syncope, dyspnea, diaphoresis, orthopnea, PND, claudication or edema. Respiratory: denies cough, dyspnea, DOE, pleurisy, hoarseness, laryngitis, wheezing.  Gastrointestinal: Denies dysphagia, odynophagia, heartburn, reflux, water brash, abdominal pain or cramps, nausea, vomiting, bloating, diarrhea, constipation, hematemesis, melena, hematochezia  or hemorrhoids. Genitourinary: Denies dysuria, frequency, urgency, nocturia, hesitancy, discharge, hematuria or flank pain. Musculoskeletal: Denies arthralgias, myalgias, stiffness, jt. swelling, pain, limping or strain/sprain.  Skin: Denies pruritus, rash, hives, warts, acne, eczema or change in skin lesion(s). Neuro: No weakness, tremor, incoordination, spasms, paresthesia or pain. Psychiatric: Denies confusion, memory loss or sensory loss. Endo: Denies change in weight, skin or hair change.  Heme/Lymph: No excessive bleeding, bruising or enlarged lymph nodes.  Physical Exam  BP 102/64   Pulse 64   Temp (!) 97.1 F (36.2 C)   Resp 16   Ht 5\' 7"  (1.702 m)   Wt 225 lb 9.6 oz (102.3 kg)   BMI 35.33 kg/m   Appears  well nourished, well groomed  and in no distress.  Eyes: PERRLA, EOMs, conjunctiva no swelling or erythema. Sinuses: No frontal/maxillary  tenderness ENT/Mouth: EAC's clear, TM's nl w/o erythema, bulging. Nares clear w/o erythema, swelling, exudates. Oropharynx clear without erythema or exudates. Oral hygiene is good. Tongue normal, non obstructing. Hearing intact.  Neck: Supple. Thyroid not palpable. Car 2+/2+ without bruits, nodes or JVD. Chest: Respirations nl with BS clear & equal w/o rales, rhonchi, wheezing or stridor.  Cor: Heart sounds normal w/ regular rate and rhythm without sig. murmurs, gallops, clicks or rubs. Peripheral pulses normal and equal  With 1+ edema.  Abdomen: Soft & bowel sounds normal. Non-tender w/o guarding, rebound, hernias, masses or organomegaly.  Lymphatics: Unremarkable.  Musculoskeletal: Sl decreased int/ext rotation of the Rt shoulder from pain. (+) crepitus of the Rt Knee and she has a normal gait.  Skin: Warm, dry without exposed rashes, lesions or ecchymosis apparent.  Neuro: Cranial nerves intact, reflexes equal bilaterally. Sensory-motor testing grossly intact. Tendon reflexes grossly intact.  Pysch: Alert & oriented x 3.  Insight and judgement  nl & appropriate. No ideations.  Assessment and Plan:  1. Essential hypertension  - Continue medication, monitor blood pressure at home.  - Continue DASH diet.  Reminder to go to the ER if any CP,  SOB, nausea, dizziness, severe HA, changes vision/speech.  - CBC with Differential/Platelet - COMPLETE METABOLIC PANEL WITH GFR - Magnesium - TSH  2. Hyperlipidemia, mixed  - Continue diet/meds, exercise,& lifestyle modifications.  - Continue monitor periodic cholesterol/liver & renal functions   - Lipid panel - TSH  3. Controlled type 2 diabetes mellitus with stage 2 chronic kidney disease, without long-term current use of insulin (HCC)  - Continue diet, exercise, lifestyle modifications.  - Monitor appropriate labs.  - Hemoglobin A1c - Insulin, random  4. Vitamin D deficiency  - Continue supplementation.   - VITAMIN D 25  Hydroxyl  5. Hypothyroidism  - TSH  6. Medication management  - CBC with Differential/Platelet - COMPLETE METABOLIC PANEL WITH GFR - Magnesium - Lipid panel - TSH - Hemoglobin A1c - Insulin, random - VITAMIN D 25 Hydroxyl        Discussed  regular exercise, BP monitoring, weight control to achieve/maintain BMI less than 25 and discussed med and SE's. Recommended labs to assess and monitor clinical status with further disposition pending results of labs. Over 30 minutes of exam, counseling, chart review was performed.

## 2017-07-17 NOTE — Patient Instructions (Signed)

## 2017-07-18 LAB — VITAMIN D 25 HYDROXY (VIT D DEFICIENCY, FRACTURES): Vit D, 25-Hydroxy: 74 ng/mL (ref 30–100)

## 2017-07-18 LAB — HEMOGLOBIN A1C
HEMOGLOBIN A1C: 6.3 %{Hb} — AB (ref <5.7)
Mean Plasma Glucose: 134 (calc)
eAG (mmol/L): 7.4 (calc)

## 2017-07-18 LAB — CBC WITH DIFFERENTIAL/PLATELET
BASOS PCT: 1.1 %
Basophils Absolute: 40 cells/uL (ref 0–200)
EOS ABS: 119 {cells}/uL (ref 15–500)
Eosinophils Relative: 3.3 %
HCT: 40.1 % (ref 35.0–45.0)
HEMOGLOBIN: 14 g/dL (ref 11.7–15.5)
Lymphs Abs: 1822 cells/uL (ref 850–3900)
MCH: 28.2 pg (ref 27.0–33.0)
MCHC: 34.9 g/dL (ref 32.0–36.0)
MCV: 80.7 fL (ref 80.0–100.0)
MONOS PCT: 11 %
MPV: 10 fL (ref 7.5–12.5)
NEUTROS ABS: 1224 {cells}/uL — AB (ref 1500–7800)
Neutrophils Relative %: 34 %
Platelets: 231 10*3/uL (ref 140–400)
RBC: 4.97 10*6/uL (ref 3.80–5.10)
RDW: 13.6 % (ref 11.0–15.0)
Total Lymphocyte: 50.6 %
WBC: 3.6 10*3/uL — ABNORMAL LOW (ref 3.8–10.8)
WBCMIX: 396 {cells}/uL (ref 200–950)

## 2017-07-18 LAB — COMPLETE METABOLIC PANEL WITH GFR
AG RATIO: 1.5 (calc) (ref 1.0–2.5)
ALT: 12 U/L (ref 6–29)
AST: 14 U/L (ref 10–35)
Albumin: 4.3 g/dL (ref 3.6–5.1)
Alkaline phosphatase (APISO): 49 U/L (ref 33–130)
BUN/Creatinine Ratio: 14 (calc) (ref 6–22)
BUN: 13 mg/dL (ref 7–25)
CALCIUM: 10.1 mg/dL (ref 8.6–10.4)
CHLORIDE: 103 mmol/L (ref 98–110)
CO2: 29 mmol/L (ref 20–32)
CREATININE: 0.94 mg/dL — AB (ref 0.60–0.93)
GFR, Est African American: 68 mL/min/{1.73_m2} (ref >=60)
GFR, Est Non African American: 59 mL/min/{1.73_m2} — ABNORMAL LOW (ref >=60)
GLOBULIN: 2.8 g/dL (ref 1.9–3.7)
GLUCOSE: 117 mg/dL — AB (ref 65–99)
POTASSIUM: 3.8 mmol/L (ref 3.5–5.3)
SODIUM: 140 mmol/L (ref 135–146)
TOTAL PROTEIN: 7.1 g/dL (ref 6.1–8.1)
Total Bilirubin: 0.8 mg/dL (ref 0.2–1.2)

## 2017-07-18 LAB — LIPID PANEL
CHOLESTEROL: 198 mg/dL (ref <200)
HDL: 69 mg/dL (ref >50)
LDL Cholesterol (Calc): 109 mg/dL (calc) — ABNORMAL HIGH
Non-HDL Cholesterol (Calc): 129 mg/dL (calc) (ref <130)
Total CHOL/HDL Ratio: 2.9 (calc) (ref <5.0)
Triglycerides: 100 mg/dL (ref <150)

## 2017-07-18 LAB — INSULIN, RANDOM: INSULIN: 21 u[IU]/mL — AB (ref 2.0–19.6)

## 2017-07-18 LAB — TSH: TSH: 2.2 m[IU]/L (ref 0.40–4.50)

## 2017-07-18 LAB — MAGNESIUM: Magnesium: 2.1 mg/dL (ref 1.5–2.5)

## 2017-07-20 ENCOUNTER — Other Ambulatory Visit: Payer: Self-pay | Admitting: Internal Medicine

## 2017-07-20 DIAGNOSIS — Z1231 Encounter for screening mammogram for malignant neoplasm of breast: Secondary | ICD-10-CM

## 2017-07-25 DIAGNOSIS — M1711 Unilateral primary osteoarthritis, right knee: Secondary | ICD-10-CM | POA: Diagnosis not present

## 2017-07-25 DIAGNOSIS — M25511 Pain in right shoulder: Secondary | ICD-10-CM | POA: Diagnosis not present

## 2017-08-12 DIAGNOSIS — I110 Hypertensive heart disease with heart failure: Secondary | ICD-10-CM | POA: Diagnosis not present

## 2017-08-12 DIAGNOSIS — E785 Hyperlipidemia, unspecified: Secondary | ICD-10-CM | POA: Diagnosis not present

## 2017-08-12 DIAGNOSIS — N183 Chronic kidney disease, stage 3 (moderate): Secondary | ICD-10-CM | POA: Diagnosis not present

## 2017-08-12 DIAGNOSIS — E1142 Type 2 diabetes mellitus with diabetic polyneuropathy: Secondary | ICD-10-CM | POA: Diagnosis not present

## 2017-08-12 DIAGNOSIS — K219 Gastro-esophageal reflux disease without esophagitis: Secondary | ICD-10-CM | POA: Diagnosis not present

## 2017-08-12 DIAGNOSIS — E1122 Type 2 diabetes mellitus with diabetic chronic kidney disease: Secondary | ICD-10-CM | POA: Diagnosis not present

## 2017-08-12 DIAGNOSIS — E1136 Type 2 diabetes mellitus with diabetic cataract: Secondary | ICD-10-CM | POA: Diagnosis not present

## 2017-08-12 DIAGNOSIS — I509 Heart failure, unspecified: Secondary | ICD-10-CM | POA: Diagnosis not present

## 2017-08-12 DIAGNOSIS — H409 Unspecified glaucoma: Secondary | ICD-10-CM | POA: Diagnosis not present

## 2017-08-12 DIAGNOSIS — Z6834 Body mass index (BMI) 34.0-34.9, adult: Secondary | ICD-10-CM | POA: Diagnosis not present

## 2017-08-12 DIAGNOSIS — E669 Obesity, unspecified: Secondary | ICD-10-CM | POA: Diagnosis not present

## 2017-08-23 ENCOUNTER — Ambulatory Visit: Payer: Commercial Managed Care - HMO

## 2017-08-23 ENCOUNTER — Ambulatory Visit
Admission: RE | Admit: 2017-08-23 | Discharge: 2017-08-23 | Disposition: A | Discharge status: Home / Self Care | Payer: Medicare HMO | Source: Ambulatory Visit | Attending: Internal Medicine | Admitting: Internal Medicine

## 2017-08-23 DIAGNOSIS — Z1231 Encounter for screening mammogram for malignant neoplasm of breast: Secondary | ICD-10-CM | POA: Diagnosis not present

## 2017-08-28 ENCOUNTER — Other Ambulatory Visit: Payer: Self-pay | Admitting: Internal Medicine

## 2017-10-25 ENCOUNTER — Other Ambulatory Visit: Payer: Self-pay | Admitting: Internal Medicine

## 2017-10-25 ENCOUNTER — Other Ambulatory Visit: Payer: Self-pay | Admitting: Physician Assistant

## 2017-10-26 DIAGNOSIS — G4700 Insomnia, unspecified: Secondary | ICD-10-CM | POA: Insufficient documentation

## 2017-10-26 NOTE — Progress Notes (Deleted)
FOLLOW UP  Assessment and Plan:   Hypertension Well controlled with current medications  Monitor blood pressure at home; patient to call if consistently greater than 130/80 Continue DASH diet.   Reminder to go to the ER if any CP, SOB, nausea, dizziness, severe HA, changes vision/speech, left arm numbness and tingling and jaw pain.  Cholesterol Currently at goal; continue with lifestyle and red rice yeast supplement Continue low cholesterol diet and exercise.  Check lipid panel.   T2DM controlled with CKD 2 Discussed risks of elevated glucose - recent A1Cs in prediabetic range by lifestyle modification Continue diet and exercise.  Has had eye exam; report requested today  Perform daily foot/skin check, notify office of any concerning changes.  Check A1C  Obesity with co morbidities Long discussion about weight loss, diet, and exercise Recommended diet heavy in fruits and veggies and low in animal meats, cheeses, and dairy products, appropriate calorie intake Discussed ideal weight for height - she would like to lose 40 lb, next visit goal 215 lb *** Will follow up in 3 months  Hypothyroidism continue medications the same pending lab results reminded to take on an empty stomach 30-35mins before food.  check TSH level  Vitamin D Def At goal at last visit; continue supplementation to maintain goal of 70-100 Defer Vit D level  Insomnia - good sleep hygiene discussed, increase day time activity, take gabapentin at night and helps reduce use of xanax.   Continue diet and meds as discussed. Further disposition pending results of labs. Discussed med's effects and SE's.   Over 30 minutes of exam, counseling, chart review, and critical decision making was performed.   Future Appointments  Date Time Provider [place]  10/27/2017 11:15 AM Liane Comber, NP [facility] None  02/15/2018 11:00 AM Unk Pinto, MD [facility] None     ----------------------------------------------------------------------------------------------------------------------  HPI 77 y.o. female  presents for 3 month follow up on hypertension, cholesterol, T2 diabetes controlled by lifestyle, CKD 2, hypothyroid, obesity and vitamin D deficiency.   She is currently prescribed xanax 1 mg daily for sleep;  She reports she uses xanax 1-2 times a week for sleep if she has had several nights in a row with difficulty falling asleep.   BMI is There is no height or weight on file to calculate BMI., she has not been working on diet and exercise. Wt Readings from Last 3 Encounters:  07/17/17 225 lb 9.6 oz (102.3 kg)  04/11/17 220 lb (99.8 kg)  10/28/16 223 lb 3.2 oz (101.2 kg)   Her blood pressure has been controlled at home, today their BP is    She does not workout. She denies chest pain, shortness of breath, dizziness.   She is not on cholesterol medication (on red rice yeast supplement) and denies myalgias. Her cholesterol is at goal. The cholesterol last visit was:   Lab Results  Component Value Date   CHOL 198 07/17/2017   HDL 69 07/17/2017   LDLCALC 109 (H) 07/17/2017   TRIG 100 07/17/2017   CHOLHDL 2.9 07/17/2017    She has not been working on diet and exercise for prediabetes/diet controlled T2DM, and denies increased appetite, nausea,  polydipsia, polyuria, visual disturbances, vomiting and weight loss. She does have neuropathy of bilateral feet notably at night, takes gabapentin and topical OTC creams for this and reports is managed well at this time. Last A1C in the office was:  Lab Results  Component Value Date   HGBA1C 6.3 (H) 07/17/2017   She  is on thyroid medication. Her medication was not changed last visit.   Lab Results  Component Value Date   TSH 2.20 07/17/2017   Patient is on Vitamin D supplement and at goal at last check:    Lab Results  Component Value Date   VD25OH 74 07/17/2017        Current Medications:   Current Outpatient Medications on File Prior to Visit  Medication Sig  . ACCU-CHEK AVIVA PLUS test strip   . ALPRAZolam (XANAX) 1 MG tablet TAKE 1 TABLET AT BEDTIME AS NEEDED FOR SLEEP  . aspirin 81 MG tablet Take 81 mg by mouth daily.   . ASSURE COMFORT LANCETS 30G MISC   . B Complex-C (SUPER B COMPLEX PO) Take 1 tablet by mouth daily.  . bisoprolol-hydrochlorothiazide (ZIAC) 10-6.25 MG tablet TAKE 1 TABLET EVERY DAY FOR BLOOD PRESSURE  . Blood Glucose Calibration (ACCU-CHEK AVIVA) SOLN   . Cholecalciferol (VITAMIN D-3) 5000 UNITS TABS Take 1 tablet by mouth daily.  Marland Kitchen estradiol (ESTRACE) 0.5 MG tablet TAKE 1/2 TABLET ON MONDAY, WEDNESDAY AND FRIDAY AND TAKE 1 TABLET THE REST OF THE WEEK (NEED TO WEAN DOWN)  . furosemide (LASIX) 40 MG tablet TAKE 1 TABLET EVERY DAY FOR BLOOD PRESSURE/FLUID  . gabapentin (NEURONTIN) 300 MG capsule TAKE 1 CAPSULE THREE TIMES DAILY  FOR  NEUROPATHY  PAIN  . Lancet Devices (ADJUSTABLE LANCING DEVICE) MISC   . latanoprost (XALATAN) 0.005 % ophthalmic solution Place 1 drop into the right eye daily.  Marland Kitchen levothyroxine (SYNTHROID, LEVOTHROID) 100 MCG tablet TAKE 1 TABLET EVERY DAY  BEFORE  BREAKFAST  . Magnesium 500 MG CAPS Take 2 capsules by mouth daily.   . ranitidine (ZANTAC) 150 MG tablet Take 150 mg by mouth 2 (two) times daily. Takes 2 tablets in the AM and 1 in the PM, if needed.  . Red Yeast Rice 600 MG CAPS Take 1 capsule by mouth daily.  . timolol (TIMOPTIC) 0.5 % ophthalmic solution Place 1 drop into the right eye 2 (two) times daily.    No current facility-administered medications on file prior to visit.      Allergies: No Known Allergies   Medical History:  Past Medical History:  Diagnosis Date  . Anal fissure   . Arthritis   . Colon polyps    hyperplastic  . Diverticulosis   . GERD (gastroesophageal reflux disease)   . Glaucoma   . Hemorrhoids   . Hyperlipidemia   . Hypertension   . Hypothyroidism   . Prediabetes    Family history-  Reviewed and unchanged Social history- Reviewed and unchanged   Review of Systems:  Review of Systems  Constitutional: Negative for malaise/fatigue and weight loss.  HENT: Negative for hearing loss and tinnitus.   Eyes: Negative for blurred vision and double vision.  Respiratory: Negative for cough, shortness of breath and wheezing.   Cardiovascular: Negative for chest pain, palpitations, orthopnea, claudication and leg swelling.  Gastrointestinal: Negative for abdominal pain, blood in stool, constipation, diarrhea, heartburn, melena, nausea and vomiting.  Genitourinary: Negative.   Musculoskeletal: Negative for joint pain and myalgias.  Skin: Negative for rash.  Neurological: Negative for dizziness, tingling, sensory change, weakness and headaches.  Endo/Heme/Allergies: Negative for polydipsia.  Psychiatric/Behavioral: Negative.   All other systems reviewed and are negative.   Physical Exam: There were no vitals taken for this visit. Wt Readings from Last 3 Encounters:  07/17/17 225 lb 9.6 oz (102.3 kg)  04/11/17 220 lb (99.8 kg)  10/28/16  223 lb 3.2 oz (101.2 kg)   General Appearance: Well nourished, in no apparent distress. Eyes: PERRLA, EOMs, conjunctiva no swelling or erythema Sinuses: No Frontal/maxillary tenderness ENT/Mouth: Ext aud canals clear, TMs without erythema, bulging. No erythema, swelling, or exudate on post pharynx.  Tonsils not swollen or erythematous. Hearing normal.  Neck: Supple, thyroid normal.  Respiratory: Respiratory effort normal, BS equal bilaterally without rales, rhonchi, wheezing or stridor.  Cardio: RRR with no MRGs. Brisk peripheral pulses without edema.  Abdomen: Soft, + BS.  Non tender, no guarding, rebound, hernias, masses. Lymphatics: Non tender without lymphadenopathy.  Musculoskeletal: Full ROM, 5/5 strength, Normal gait Skin: Warm, dry without rashes, lesions, ecchymosis.  Neuro: Cranial nerves intact. No cerebellar symptoms.  Psych:  Awake and oriented X 3, normal affect, Insight and Judgment appropriate.    Rebecca Ribas, NP 12:58 PM [facility]

## 2017-10-27 ENCOUNTER — Encounter: Payer: Self-pay | Admitting: Adult Health

## 2017-10-27 ENCOUNTER — Ambulatory Visit (INDEPENDENT_AMBULATORY_CARE_PROVIDER_SITE_OTHER): Payer: Medicare HMO | Admitting: Adult Health

## 2017-10-27 VITALS — BP 112/64 | HR 44 | Temp 97.5°F | Ht 67.0 in | Wt 223.8 lb

## 2017-10-27 DIAGNOSIS — Z86010 Personal history of colonic polyps: Secondary | ICD-10-CM | POA: Diagnosis not present

## 2017-10-27 DIAGNOSIS — E669 Obesity, unspecified: Secondary | ICD-10-CM

## 2017-10-27 DIAGNOSIS — K219 Gastro-esophageal reflux disease without esophagitis: Secondary | ICD-10-CM

## 2017-10-27 DIAGNOSIS — E1140 Type 2 diabetes mellitus with diabetic neuropathy, unspecified: Secondary | ICD-10-CM

## 2017-10-27 DIAGNOSIS — E039 Hypothyroidism, unspecified: Secondary | ICD-10-CM | POA: Diagnosis not present

## 2017-10-27 DIAGNOSIS — H409 Unspecified glaucoma: Secondary | ICD-10-CM

## 2017-10-27 DIAGNOSIS — R6889 Other general symptoms and signs: Secondary | ICD-10-CM | POA: Diagnosis not present

## 2017-10-27 DIAGNOSIS — Z79899 Other long term (current) drug therapy: Secondary | ICD-10-CM

## 2017-10-27 DIAGNOSIS — Z0001 Encounter for general adult medical examination with abnormal findings: Secondary | ICD-10-CM

## 2017-10-27 DIAGNOSIS — E782 Mixed hyperlipidemia: Secondary | ICD-10-CM

## 2017-10-27 DIAGNOSIS — Z1211 Encounter for screening for malignant neoplasm of colon: Secondary | ICD-10-CM

## 2017-10-27 DIAGNOSIS — I10 Essential (primary) hypertension: Secondary | ICD-10-CM

## 2017-10-27 DIAGNOSIS — M1990 Unspecified osteoarthritis, unspecified site: Secondary | ICD-10-CM

## 2017-10-27 DIAGNOSIS — E559 Vitamin D deficiency, unspecified: Secondary | ICD-10-CM

## 2017-10-27 DIAGNOSIS — E1122 Type 2 diabetes mellitus with diabetic chronic kidney disease: Secondary | ICD-10-CM | POA: Diagnosis not present

## 2017-10-27 DIAGNOSIS — N182 Chronic kidney disease, stage 2 (mild): Secondary | ICD-10-CM

## 2017-10-27 DIAGNOSIS — G4700 Insomnia, unspecified: Secondary | ICD-10-CM | POA: Diagnosis not present

## 2017-10-27 DIAGNOSIS — Z0000 Encounter for general adult medical examination without abnormal findings: Secondary | ICD-10-CM

## 2017-10-27 NOTE — Patient Instructions (Addendum)
Call insurance about tetanus shot coverage -   Recommend working hard on exercise and some weight loss prior to knee surgery, which will improve your chances of a successful surgery and recovery    Rebecca Graves , Thank you for taking time to come for your Medicare Wellness Visit. I appreciate your ongoing commitment to your health goals. Please review the following plan we discussed and let me know if I can assist you in the future.   These are the goals we discussed: Goals    . Exercise 3x per week     . Weight (lb) < 215 lb (97.5 kg)       This is a list of the screening recommended for you and due dates:  Health Maintenance  Topic Date Due  . Flu Shot  11/28/2017*  . Tetanus Vaccine  10/28/2018*  . Complete foot exam   10/28/2017  . Colon Cancer Screening  11/29/2017  . Hemoglobin A1C  01/17/2018  . Eye exam for diabetics  04/13/2018  . DEXA scan (bone density measurement)  Completed  . Pneumonia vaccines  Completed  *Topic was postponed. The date shown is not the original due date.    Know what a healthy weight is for you (roughly BMI <25) and aim to maintain this  Aim for 5-10 servings of fruits and vegetables daily  65-80+ fluid ounces of water or unsweet tea for healthy kidneys  Limit to max 1 drink of alcohol per day; avoid smoking/tobacco  Limit animal fats in diet for cholesterol and heart health - choose grass fed whenever available  Avoid highly processed foods, and foods high in saturated/trans fats  Aim for low stress - take time to unwind and care for your mental health  Aim for 150 min of moderate intensity exercise weekly for heart health, and weights twice weekly for bone health  Aim for 7-9 hours of sleep daily     When it comes to diets, agreement about the perfect plan isn't easy to find, even among the experts. Experts at the [employer] developed an idea known as the Healthy Eating Plate. Just imagine a plate divided into  logical, healthy portions.  The emphasis is on diet quality:  Load up on vegetables and fruits - one-half of your plate: Aim for color and variety, and remember that potatoes don't count.  Go for whole grains - one-quarter of your plate: Whole wheat, barley, wheat berries, quinoa, oats, brown rice, and foods made with them. If you want pasta, go with whole wheat pasta.  Protein power - one-quarter of your plate: Fish, chicken, beans, and nuts are all healthy, versatile protein sources. Limit red meat.  The diet, however, does go beyond the plate, offering a few other suggestions.  Use healthy plant oils, such as olive, canola, soy, corn, sunflower and peanut. Check the labels, and avoid partially hydrogenated oil, which have unhealthy trans fats.  If you're thirsty, drink water. Coffee and tea are good in moderation, but skip sugary drinks and limit milk and dairy products to one or two daily servings.  The type of carbohydrate in the diet is more important than the amount. Some sources of carbohydrates, such as vegetables, fruits, whole grains, and beans-are healthier than others.  Finally, stay active.

## 2017-10-27 NOTE — Progress Notes (Signed)
MEDICARE ANNUAL WELLNESS VISIT AND FOLLOW UP  Assessment:   Encounter for Medicare annual wellness exam  Essential hypertension - continue medications, DASH diet, exercise and monitor at home. Call if greater than 130/80. -     CBC with Differential/Platelet -     CMP/GFR -     TSH  Gastroesophageal reflux disease, esophagitis presence not specified Continue PPI/H2 blocker, diet discussed  Type 2 diabetes mellitus with diabetic neuropathy, without long-term current use of insulin ([place]) Discussed general issues about diabetes pathophysiology and management., Educational material distributed., Suggested low cholesterol diet., Encouraged aerobic exercise., Discussed foot care., Reminded to get yearly retinal exam. -     Hemoglobin A1c  Controlled type 2 diabetes mellitus with stage 2 chronic kidney disease, without long-term current use of insulin ([place]) Discussed general issues about diabetes pathophysiology and management., Educational material distributed., Suggested low cholesterol diet., Encouraged aerobic exercise., Discussed foot care., Reminded to get yearly retinal exam. -     Hemoglobin A1c  Hypothyroidism, unspecified type Hypothyroidism-check TSH level, continue medications the same, reminded to take on an empty stomach 30-65mins before food.  -     Lipid panel  Peripheral sensory neuropathy -     gabapentin (NEURONTIN) 300 MG capsule; TAKE 1 CAPSULE THREE TIMES DAILY  FOR  NEUROPATHY  PAIN  Osteoarthritis, unspecified osteoarthritis type, unspecified site Followed by Dr. Mayer Camel, discussing surgery   Mixed hyperlipidemia -continue medications, check lipids, decrease fatty foods, increase activity.   Morbid obesity (BMI 33.40) - long discussion about weight loss, diet, and exercise  Vitamin D deficiency Continue supplement  Medication management -     Magnesium  Glaucoma, unspecified glaucoma type, unspecified laterality Continue eye doctor follow up  Obesity -  long discussion about weight loss, diet, and exercise -recommended diet heavy in fruits and veggies and low in animal meats, cheeses, and dairy products - restart swimming regularly - weight loss recommended prior to knee surgery  Estrogen deficiency Reduce to 1/2 tab with goal to taper off this winter  R shoulder pain ? Impingement, possible rotator cuff pathology, continue with OTC analgesics and recommended she follow up with ortho  Future Appointments  Date Time Provider [place]  02/15/2018 11:00 AM Unk Pinto, MD [facility] None     Plan:   During the course of the visit the patient was educated and counseled about appropriate screening and preventive services including:    Pneumococcal vaccine   Influenza vaccine  Td vaccine  Screening electrocardiogram  Screening mammography  Bone densitometry screening  Colorectal cancer screening  Diabetes screening  Glaucoma screening  Nutrition counseling   Advanced directives: given info/requested   Subjective:   Rebecca Graves is a 77 y.o. female who presents for Medicare Annual Wellness Visit and 3 month follow up on hypertension, prediabetes, hyperlipidemia, vitamin D def.   She is established with Dr. Mayer Camel for right knee arthritis, discussing surgery; also having some R shoulder pain, worse at night, with raising above her head, with some radicular pain down arm in C5-C7 distribution, intermittent throbbing in first 3 digits; taking tylenol at night which is helpful.  S/p hysterectomy in 1980s. She is on estrace and she is on 2 bASA, normal MGM in 07/2017.   BMI is Body mass index is 35.05 kg/m., she has not been working on diet, stopped swimming after her niece passed away who she was swimming with.  Wt Readings from Last 3 Encounters:  10/27/17 223 lb 12.8 oz (101.5 kg)  07/17/17  225 lb 9.6 oz (102.3 kg)  04/11/17 220 lb (99.8 kg)   Her blood pressure has been controlled at home, today  their BP is BP: 112/64 She does workout. She denies chest pain, shortness of breath, dizziness.   She is not on cholesterol medication (on RYRS) and denies myalgias. Her cholesterol is at goal. The cholesterol last visit was:   Lab Results  Component Value Date   CHOL 198 07/17/2017   HDL 69 07/17/2017   LDLCALC 109 (H) 07/17/2017   TRIG 100 07/17/2017   CHOLHDL 2.9 07/17/2017   She has been working on diet and exercise for prediabetes, she has cut out soda, switched to diet tea, she is not on ASA and she is not on ACE, and denies paresthesia of the feet, polydipsia and polyuria. She does check fasting sugars, typically 100-130 with rare outliers ~150 and 90s. Last A1C in the office was:  Lab Results  Component Value Date   HGBA1C 6.3 (H) 07/17/2017   She has stopped her restless leg med, stopped mobic, and states she always drink a lot of water.  Lab Results  Component Value Date   GFRAA 68 07/17/2017   Patient is on Vitamin D supplement. Lab Results  Component Value Date   VD25OH 74 07/17/2017     She is on thyroid medication. Her medication was changed last visit, on 1/2  pill on F and 1 pill the other days. Patient denies nervousness, palpitations and weight changes.  Lab Results  Component Value Date   TSH 2.20 07/17/2017     Medication Review Current Outpatient Medications on File Prior to Visit  Medication Sig Dispense Refill  . ACCU-CHEK AVIVA PLUS test strip     . ALPRAZolam (XANAX) 1 MG tablet TAKE 1 TABLET AT BEDTIME AS NEEDED FOR SLEEP 30 tablet 2  . aspirin 81 MG tablet Take 81 mg by mouth daily.     . ASSURE COMFORT LANCETS 30G MISC     . B Complex-C (SUPER B COMPLEX PO) Take 1 tablet by mouth daily.    . bisoprolol-hydrochlorothiazide (ZIAC) 10-6.25 MG tablet TAKE 1 TABLET EVERY DAY FOR BLOOD PRESSURE 90 tablet 1  . Blood Glucose Calibration (ACCU-CHEK AVIVA) SOLN     . Cholecalciferol (VITAMIN D-3) 5000 UNITS TABS Take 1 tablet by mouth daily.    Marland Kitchen  estradiol (ESTRACE) 0.5 MG tablet TAKE 1/2 TABLET ON MONDAY, WEDNESDAY AND FRIDAY AND TAKE 1 TABLET THE REST OF THE WEEK (NEED TO WEAN DOWN) 72 tablet 0  . furosemide (LASIX) 40 MG tablet TAKE 1 TABLET EVERY DAY FOR BLOOD PRESSURE/FLUID 90 tablet 1  . gabapentin (NEURONTIN) 300 MG capsule TAKE 1 CAPSULE THREE TIMES DAILY  FOR  NEUROPATHY  PAIN 270 capsule 1  . [employer] (ADJUSTABLE LANCING DEVICE) MISC     . latanoprost (XALATAN) 0.005 % ophthalmic solution Place 1 drop into the right eye daily.    Marland Kitchen levothyroxine (SYNTHROID, LEVOTHROID) 100 MCG tablet TAKE 1 TABLET EVERY DAY  BEFORE  BREAKFAST 90 tablet 2  . Magnesium 500 MG CAPS Take 2 capsules by mouth daily.     . ranitidine (ZANTAC) 150 MG tablet Take 150 mg by mouth 2 (two) times daily. Takes 2 tablets in the AM and 1 in the PM, if needed.    . Red Yeast Rice 600 MG CAPS Take 1 capsule by mouth daily.    . timolol (TIMOPTIC) 0.5 % ophthalmic solution Place 1 drop into the right eye 2 (  two) times daily.      No current facility-administered medications on file prior to visit.     Current Problems (verified) Patient Active Problem List   Diagnosis Date Noted  . Insomnia 10/26/2017  . T2_DM w/ Periph Sensory Neuropathy 01/18/2015  . T2_DM w/CKD 2 (GFR 69 ml/min)  (HCC) 01/18/2015  . Obesity (BMI 30.0-34.9) 07/03/2014  . Vitamin D deficiency 03/31/2013  . Medication management 03/31/2013  . DJD   . Glaucoma   . Hypothyroidism   . Hypertension   . Hyperlipidemia   . GERD (gastroesophageal reflux disease)   . Tear of lateral meniscus of knee, current 03/27/2011    Screening Tests Immunization History  Administered Date(s) Administered  . Influenza-Unspecified 12/09/2013, 11/14/2015, 11/23/2016  . Pneumococcal Conjugate-13 04/21/2015  . Pneumococcal Polysaccharide-23 05/29/2008  . Td 04/26/2007    Preventative care: Last colonoscopy: Dr. Fuller Plan on 11/2012 due 2019 Last mammogram: 07/2017 Last pap smear/pelvic exam:  remote, declines DEXA: 2017, normal, declines today, will get later US carotid 2017  Prior vaccinations: TD or Tdap: 2009, declines today, will call insurance  Influenza: 2018 Pneumococcal: 2010 Prevnar 13: 2017 Shingles/Zostavax: declines due to price  Names of Other Physician/Practitioners you currently use: 1. [facility]- here for primary care 2. Dr. Peter Garter, eye doctor,has appointment coming up, visits every 6 months for glacoma, 2019 3. Can't remember name, dentist, last visit 2017  Patient Care Team: Unk Pinto, MD as PCP - General (Internal Medicine) Camillo Flaming, [place] as Referring Physician (Optometry) Suella Broad, MD as Consulting Physician (Physical Medicine and Rehabilitation) Ladene Artist, MD as Consulting Physician (Gastroenterology) Myrlene Broker, MD as Attending Physician (Urology)   Allergies No Known Allergies  SURGICAL HISTORY She  has a past surgical history that includes Abdominal hysterectomy; Appendectomy; Joint replacement (2007); Bladder suspension; and Knee arthroscopy (03/28/2011). FAMILY HISTORY Her family history includes Esophageal cancer in her other. SOCIAL HISTORY She  reports that she quit smoking about 21 years ago. She has never used smokeless tobacco. She reports that she drinks about 2.0 standard drinks of alcohol per week. She reports that she does not use drugs.  MEDICARE WELLNESS OBJECTIVES: Physical activity: Current Exercise Habits: Home exercise routine, Type of exercise: Other - see comments(was swimming but stopped, will restart), Exercise limited by: orthopedic condition(s) Cardiac risk factors: Cardiac Risk Factors include: dyslipidemia;advanced age (>32men, >60 women);diabetes mellitus;hypertension;obesity (BMI >30kg/m2);sedentary lifestyle;smoking/ tobacco exposure Depression/mood screen:   Depression screen [facility] 2/9 10/27/2017  Decreased Interest 0  Down, Depressed, Hopeless 0   PHQ - 2 Score 0    ADLs:  In your present state of health, do you have any difficulty performing the following activities: 10/27/2017 07/17/2017  Hearing? N N  Vision? N N  Difficulty concentrating or making decisions? N N  Walking or climbing stairs? N N  Dressing or bathing? N N  Doing errands, shopping? N N  Some recent data might be hidden     Cognitive Testing  Alert? Yes  Normal Appearance?Yes  Oriented to person? Yes  Place? Yes   Time? Yes  Recall of three objects?  Yes  Can perform simple calculations? Yes  Displays appropriate judgment?Yes  Can read the correct time from a watch face?Yes  EOL planning: Does Patient Have a Medical Advance Directive?: No Would patient like information on creating a medical advance directive?: Yes (MAU/Ambulatory/Procedural Areas - Information given)   Objective:   Blood pressure 112/64, pulse (!) 44, temperature (!) 97.5 F (36.4  C), height 5\' 7"  (1.702 m), weight 223 lb 12.8 oz (101.5 kg), SpO2 97 %. Body mass index is 35.05 kg/m.  General appearance: alert, no distress, WD/WN,  female HEENT: normocephalic, sclerae anicteric, TMs pearly, nares patent, no discharge or erythema, pharynx normal Oral cavity: MMM, no lesions Neck: supple, no lymphadenopathy, no thyromegaly, no masses Heart: RRR, normal S1, S2, no murmurs Lungs: CTA bilaterally, no wheezes, rhonchi, or rales Abdomen: +bs, soft, non tender, non distended, no masses, no hepatomegaly, no splenomegaly Musculoskeletal: nontender, mild effusion and bony enlargement to right knee, no obvious deformity to R shoulder but with + Neers, pain with shoulder range 90+ degrees, + weakness/pain with empty can test Extremities: no edema, no cyanosis, no clubbing Pulses: 2+ symmetric, upper and lower extremities, normal cap refill Neurological: alert, oriented x 3, CN2-12 intact, strength normal upper extremities and lower extremities, sensation normal throughout, DTRs 2+ throughout, no  cerebellar signs, gait normal Psychiatric: normal affect, behavior normal, pleasant  Breast: defer Gyn: defer Rectal: defer  Medicare Attestation I have personally reviewed: The patient's medical and social history Their use of alcohol, tobacco or illicit drugs Their current medications and supplements The patient's functional ability including ADLs,fall risks, home safety risks, cognitive, and hearing and visual impairment Diet and physical activities Evidence for depression or mood disorders  The patient's weight, height, BMI, and visual acuity have been recorded in the chart.  I have made referrals, counseling, and provided education to the patient based on review of the above and I have provided the patient with a written personalized care plan for preventive services.     Rebecca Ribas, NP   10/27/2017

## 2017-10-28 LAB — CBC WITH DIFFERENTIAL/PLATELET
BASOS ABS: 29 {cells}/uL (ref 0–200)
Basophils Relative: 0.7 %
EOS PCT: 2.4 %
Eosinophils Absolute: 98 cells/uL (ref 15–500)
HEMATOCRIT: 43.6 % (ref 35.0–45.0)
HEMOGLOBIN: 14.6 g/dL (ref 11.7–15.5)
Lymphs Abs: 1784 cells/uL (ref 850–3900)
MCH: 27 pg (ref 27.0–33.0)
MCHC: 33.5 g/dL (ref 32.0–36.0)
MCV: 80.6 fL (ref 80.0–100.0)
MPV: 10 fL (ref 7.5–12.5)
Monocytes Relative: 11 %
NEUTROS PCT: 42.4 %
Neutro Abs: 1738 cells/uL (ref 1500–7800)
Platelets: 262 10*3/uL (ref 140–400)
RBC: 5.41 10*6/uL — AB (ref 3.80–5.10)
RDW: 13.3 % (ref 11.0–15.0)
TOTAL LYMPHOCYTE: 43.5 %
WBC mixed population: 451 cells/uL (ref 200–950)
WBC: 4.1 10*3/uL (ref 3.8–10.8)

## 2017-10-28 LAB — COMPLETE METABOLIC PANEL WITH GFR
AG Ratio: 1.6 (calc) (ref 1.0–2.5)
ALBUMIN MSPROF: 4.5 g/dL (ref 3.6–5.1)
ALKALINE PHOSPHATASE (APISO): 54 U/L (ref 33–130)
ALT: 11 U/L (ref 6–29)
AST: 14 U/L (ref 10–35)
BUN / CREAT RATIO: 14 (calc) (ref 6–22)
BUN: 15 mg/dL (ref 7–25)
CO2: 28 mmol/L (ref 20–32)
CREATININE: 1.04 mg/dL — AB (ref 0.60–0.93)
Calcium: 10.7 mg/dL — ABNORMAL HIGH (ref 8.6–10.4)
Chloride: 100 mmol/L (ref 98–110)
GFR, EST AFRICAN AMERICAN: 60 mL/min/{1.73_m2} (ref >=60)
GFR, Est Non African American: 52 mL/min/{1.73_m2} — ABNORMAL LOW (ref >=60)
GLOBULIN: 2.8 g/dL (ref 1.9–3.7)
Glucose, Bld: 122 mg/dL — ABNORMAL HIGH (ref 65–99)
Potassium: 3.9 mmol/L (ref 3.5–5.3)
SODIUM: 139 mmol/L (ref 135–146)
TOTAL PROTEIN: 7.3 g/dL (ref 6.1–8.1)
Total Bilirubin: 0.8 mg/dL (ref 0.2–1.2)

## 2017-10-28 LAB — HEMOGLOBIN A1C
EAG (MMOL/L): 7.6 (calc)
Hgb A1c MFr Bld: 6.4 % of total Hgb — ABNORMAL HIGH (ref <5.7)
Mean Plasma Glucose: 137 (calc)

## 2017-10-28 LAB — LIPID PANEL
CHOLESTEROL: 221 mg/dL — AB (ref <200)
HDL: 71 mg/dL (ref >50)
LDL CHOLESTEROL (CALC): 129 mg/dL — AB
Non-HDL Cholesterol (Calc): 150 mg/dL (calc) — ABNORMAL HIGH (ref <130)
Total CHOL/HDL Ratio: 3.1 (calc) (ref <5.0)
Triglycerides: 104 mg/dL (ref <150)

## 2017-10-28 LAB — TSH: TSH: 4.17 m[IU]/L (ref 0.40–4.50)

## 2017-10-30 ENCOUNTER — Other Ambulatory Visit: Payer: Self-pay | Admitting: Internal Medicine

## 2017-11-01 ENCOUNTER — Encounter: Payer: Self-pay | Admitting: Gastroenterology

## 2017-11-15 DIAGNOSIS — G5601 Carpal tunnel syndrome, right upper limb: Secondary | ICD-10-CM | POA: Diagnosis not present

## 2017-11-15 DIAGNOSIS — M25511 Pain in right shoulder: Secondary | ICD-10-CM | POA: Diagnosis not present

## 2017-11-15 DIAGNOSIS — M1711 Unilateral primary osteoarthritis, right knee: Secondary | ICD-10-CM | POA: Diagnosis not present

## 2017-11-23 NOTE — Progress Notes (Signed)
Subjective:    Patient ID: Rebecca Graves, female    DOB: [birth year], 77 y.o.   MRN: [id number]  HPI   This very nice 77 yo DBF with hx/o HTN, HLD, T2_DM, Vit D Deficiency presents with c/o worsening dependent edema despite her current diuretic therapy. Apparently she was seen 10 days ago by her orthopedist and Rx'd Meloxicam. Then about 1 week ago , she noticed swelling of her feet and ankles and has gained about 10 # in the interim. ashe denies any CP, palpitations, DOE/Othhopnea or PND. No respiratory sx's.    Wt Readings from Last 2 Encounters:  11/24/17 230 lb (104.3 kg)  10/27/17 223 lb 12.8 oz (101.5 kg)    Medication Sig  . ALPRAZolam  1 MG tablet TAKE 1 TABLET AT BEDTIME AS NEEDED FOR SLEEP  . aspirin 81 MG tablet Take 81 mg by mouth daily.   . B Complex-C  Take 1 tablet by mouth daily.  . bisoprolol-hctz 10-6.25  TAKE 1 TABLET EVERY DAY FOR BLOOD PRESSURE  . VITAMIN D 5000 UNITS  Take 1 tablet by mouth daily.  Marland Kitchen estradiol 0.5 MG tablet Take1/2 Tablet MonWedFri  & 1 tablet TThSS (Need to wean down)  . furosemide  40 MG tablet TAKE 1 TABLET EVERY DAY FOR BLOOD PRESSURE/FLUID  . gabapentin  300 MG capsule TAKE 1 CAPSULE THREE TIMES DAILY  FOR  NEUROPATHY  PAIN  . XALATANophth soln Place 1 drop into the right eye daily.  . Levothyroxine 100 MCG  TAKE 1 TABLET EVERY DAY  BEFORE  BREAKFAST  . Magnesium 500 MG  Take 2 capsules by mouth daily.   . ranitidine  150 MG tablet Take 150 mg by mouth 2 (two) times daily. Takes 2 tablets in the AM and 1 in the PM, if needed.  . Red Yeast Rice 600 MG Take 1 capsule by mouth daily.  Marland Kitchen TIMOPTIC 0.5 % ophth soln Place 1 drop into the right eye 2 (two) times daily.    No Known Allergies   Past Medical History:  Diagnosis Date  . Anal fissure   . Arthritis   . Colon polyps    hyperplastic  . Diverticulosis   . GERD (gastroesophageal reflux disease)   . Glaucoma   . Hemorrhoids   . Hyperlipidemia   . Hypertension   . Hypothyroidism   .  Prediabetes       10 point systems review negative except as above.    Objective:   Physical Exam  BP (!) 142/66   Pulse 60   Temp (!) 97.3 F (36.3 C)   Resp 16   Ht 5\' 7"  (1.702 m)   Wt 232 lb (105.2 kg)   BMI 36.34 kg/m   HEENT - WNL. Neck - supple.  Chest - Clear equal BS. Cor - Nl HS. RRR w/o sig MGR. PP 1(+).  1-2 (+) ankle/pedal edema. MS- FROM w/o deformities.  Nl Rom of the Rt shoulder with tenderness  of the anterior jt line. Gait Nl. Neuro -  Nl w/o focal abnormalities.    Assessment & Plan:   1. Edema - Recommended stop Meloxicam and avoid other NSAID's as Ibuprofen & Naproxen - Advised to increase her Lasix to bid or up to 2 tabs bid til return to her baseline weight of 230#.   2. Acute pain of right shoulder  - predniSONE  20 MG tablet; 1 tab 3 x day for 3 days, then 1 tab 2 x day  for 3 days, then 1 tab 1 x day for 5 days  Dispense: 20 tablet; Refill: 0  3. Need for prophylactic vaccination and inoculation against influenza - Flu vaccine HIGH DOSE PF (Fluzone High dose)

## 2017-11-24 ENCOUNTER — Other Ambulatory Visit: Payer: Self-pay

## 2017-11-24 ENCOUNTER — Encounter: Payer: Self-pay | Admitting: Gastroenterology

## 2017-11-24 ENCOUNTER — Encounter: Payer: Self-pay | Admitting: Internal Medicine

## 2017-11-24 ENCOUNTER — Ambulatory Visit: Payer: Medicare HMO | Admitting: *Deleted

## 2017-11-24 ENCOUNTER — Ambulatory Visit (INDEPENDENT_AMBULATORY_CARE_PROVIDER_SITE_OTHER): Payer: Medicare HMO | Admitting: Internal Medicine

## 2017-11-24 VITALS — BP 142/66 | HR 60 | Temp 97.3°F | Resp 16 | Ht 67.0 in | Wt 232.0 lb

## 2017-11-24 VITALS — Ht 68.0 in | Wt 230.0 lb

## 2017-11-24 DIAGNOSIS — M25511 Pain in right shoulder: Secondary | ICD-10-CM | POA: Diagnosis not present

## 2017-11-24 DIAGNOSIS — Z23 Encounter for immunization: Secondary | ICD-10-CM | POA: Diagnosis not present

## 2017-11-24 DIAGNOSIS — Z86010 Personal history of colonic polyps: Secondary | ICD-10-CM

## 2017-11-24 DIAGNOSIS — R609 Edema, unspecified: Secondary | ICD-10-CM

## 2017-11-24 MED ORDER — SUPREP BOWEL PREP KIT 17.5-3.13-1.6 GM/177ML PO SOLN: 1.0000 | Freq: Once | ORAL | 0 refills | Status: AC

## 2017-11-24 MED ORDER — PREDNISONE 20 MG PO TABS: ORAL_TABLET | ORAL | 0 refills | Status: DC

## 2017-11-24 NOTE — Progress Notes (Signed)
No egg or soy allergy known to patient  No issues with past sedation with any surgeries  or procedures, no intubation problems  No diet pills per patient No home 02 use per patient  No blood thinners per patient  Pt denies issues with constipation  No A fib or A flutter  EMMI video  Offered and declined by the patient. 

## 2017-12-04 ENCOUNTER — Encounter: Payer: Self-pay | Admitting: Internal Medicine

## 2017-12-08 ENCOUNTER — Ambulatory Visit (AMBULATORY_SURGERY_CENTER): Payer: Medicare HMO | Admitting: Gastroenterology

## 2017-12-08 ENCOUNTER — Encounter: Payer: Self-pay | Admitting: Gastroenterology

## 2017-12-08 VITALS — BP 143/61 | HR 48 | Temp 97.8°F | Resp 15 | Ht 68.0 in | Wt 230.0 lb

## 2017-12-08 DIAGNOSIS — K635 Polyp of colon: Secondary | ICD-10-CM

## 2017-12-08 DIAGNOSIS — Z86010 Personal history of colonic polyps: Secondary | ICD-10-CM | POA: Diagnosis not present

## 2017-12-08 DIAGNOSIS — D125 Benign neoplasm of sigmoid colon: Secondary | ICD-10-CM

## 2017-12-08 DIAGNOSIS — E7800 Pure hypercholesterolemia, unspecified: Secondary | ICD-10-CM | POA: Diagnosis not present

## 2017-12-08 DIAGNOSIS — I10 Essential (primary) hypertension: Secondary | ICD-10-CM | POA: Diagnosis not present

## 2017-12-08 MED ORDER — SODIUM CHLORIDE 0.9 % IV SOLN: 500.0000 mL | Freq: Once | INTRAVENOUS | Status: DC

## 2017-12-08 NOTE — Patient Instructions (Signed)
YOU HAD AN ENDOSCOPIC PROCEDURE TODAY AT [facility]:   Refer to the procedure report that was given to you for any specific questions about what was found during the examination.  If the procedure report does not answer your questions, please call your gastroenterologist to clarify.  If you requested that your care partner not be given the details of your procedure findings, then the procedure report has been included in a sealed envelope for you to review at your convenience later.  YOU SHOULD EXPECT: Some feelings of bloating in the abdomen. Passage of more gas than usual.  Walking can help get rid of the air that was put into your GI tract during the procedure and reduce the bloating. If you had a lower endoscopy (such as a colonoscopy or flexible sigmoidoscopy) you may notice spotting of blood in your stool or on the toilet paper. If you underwent a bowel prep for your procedure, you may not have a normal bowel movement for a few days.  Please Note:  You might notice some irritation and congestion in your nose or some drainage.  This is from the oxygen used during your procedure.  There is no need for concern and it should clear up in a day or so.  SYMPTOMS TO REPORT IMMEDIATELY:   Following lower endoscopy (colonoscopy or flexible sigmoidoscopy):  Excessive amounts of blood in the stool  Significant tenderness or worsening of abdominal pains  Swelling of the abdomen that is new, acute  Fever of 100F or higher   Following upper endoscopy (EGD)  Vomiting of blood or coffee ground material  New chest pain or pain under the shoulder blades  Painful or persistently difficult swallowing  New shortness of breath  Fever of 100F or higher  Black, tarry-looking stools  For urgent or emergent issues, a gastroenterologist can be reached at any hour by calling [phone].   DIET:  We do recommend a small meal at first, but then you may proceed to your regular diet.  Drink  plenty of fluids but you should avoid alcoholic beverages for 24 hours.  ACTIVITY:  You should plan to take it easy for the rest of today and you should NOT DRIVE or use heavy machinery until tomorrow (because of the sedation medicines used during the test).    FOLLOW UP: Our staff will call the number listed on your records the next business day following your procedure to check on you and address any questions or concerns that you may have regarding the information given to you following your procedure. If we do not reach you, we will leave a message.  However, if you are feeling well and you are not experiencing any problems, there is no need to return our call.  We will assume that you have returned to your regular daily activities without incident.  If any biopsies were taken you will be contacted by phone or by letter within the next 1-3 weeks.  Please call us at [phone] if you have not heard about the biopsies in 3 weeks.    SIGNATURES/CONFIDENTIALITY: You and/or your care partner have signed paperwork which will be entered into your electronic medical record.  These signatures attest to the fact that that the information above on your After Visit Summary has been reviewed and is understood.  Full responsibility of the confidentiality of this discharge information lies with you and/or your care-partner.  Hemorrhoids,polyp, diverticulosis information given.  Hydrocortisone 1% twice a day for  one week to hemorrhoids.  High fiber diet information given,  No repeat colonoscopy.

## 2017-12-08 NOTE — Progress Notes (Signed)
Pt's states no medical or surgical changes since previsit or office visit. 

## 2017-12-08 NOTE — Progress Notes (Signed)
Called to room to assist during endoscopic procedure.  Patient ID and intended procedure confirmed with present staff. Received instructions for my participation in the procedure from the performing physician.  

## 2017-12-08 NOTE — Op Note (Signed)
[facility] Patient Name: Rebecca Graves Procedure Date: 12/08/2017 2:12 PM MRN: [id number] Endoscopist: Ladene Artist , MD Age: 77 Referring MD:  Date of Birth: August 20, 1940 Gender: Female Account #: [id number] Procedure:                Colonoscopy Indications:              Surveillance: Personal history of adenomatous                            polyps on last colonoscopy 5 years ago Medicines:                Monitored Anesthesia Care Procedure:                Pre-Anesthesia Assessment:                           - Prior to the procedure, a History and Physical                            was performed, and patient medications and                            allergies were reviewed. The patient's tolerance of                            previous anesthesia was also reviewed. The risks                            and benefits of the procedure and the sedation                            options and risks were discussed with the patient.                            All questions were answered, and informed consent                            was obtained. Prior Anticoagulants: The patient has                            taken no previous anticoagulant or antiplatelet                            agents. ASA Grade Assessment: II - A patient with                            mild systemic disease. After reviewing the risks                            and benefits, the patient was deemed in                            satisfactory condition to undergo the procedure.  After obtaining informed consent, the colonoscope                            was passed under direct vision. Throughout the                            procedure, the patient's blood pressure, pulse, and                            oxygen saturations were monitored continuously. The                            Colonoscope was introduced through the anus and                            advanced to the the  cecum, identified by                            appendiceal orifice and ileocecal valve. The                            ileocecal valve, appendiceal orifice, and rectum                            were photographed. The quality of the bowel                            preparation was good. The colonoscopy was performed                            without difficulty. The patient tolerated the                            procedure well. Scope In: 2:18:48 PM Scope Out: 2:36:28 PM Scope Withdrawal Time: 0 hours 9 minutes 27 seconds  Total Procedure Duration: 0 hours 17 minutes 40 seconds  Findings:                 External hemorrhoids and tags were found during                            perianal exam. The hemorrhoids were moderate.                           A 6 mm polyp was found in the sigmoid colon. The                            polyp was sessile. The polyp was removed with a                            cold snare. Resection and retrieval were complete.                           There was a medium-sized lipoma, 10 mm in diameter,  in the transverse colon.                           Multiple small-mouthed diverticula were found in                            the left colon. There was no evidence of                            diverticular bleeding.                           The exam was otherwise without abnormality on                            direct and retroflexion views. Complications:            No immediate complications. Estimated blood loss:                            None. Estimated Blood Loss:     Estimated blood loss: none. Impression:               - External hemorrhoids and tags.                           - One 6 mm polyp in the sigmoid colon, removed with                            a cold snare. Resected and retrieved.                           - Lipoma in the transverse colon.                           - Mild diverticulosis in the left colon.                            - The examination was otherwise normal on direct                            and retroflexion views. Recommendation:           - Patient has a contact number available for                            emergencies. The signs and symptoms of potential                            delayed complications were discussed with the                            patient. Return to normal activities tomorrow.                            Written discharge instructions were provided to the  patient.                           - High fiber diet.                           - Continue present medications.                           - Await pathology results.                           - Hydrocortisone 1% cream bid for 1 week to                            hemorrhoids as needed.                           - No repeat colonoscopy due to age. Ladene Artist, MD 12/08/2017 2:46:47 PM This report has been signed electronically.

## 2017-12-08 NOTE — Progress Notes (Signed)
Report given to PACU, vss 

## 2017-12-11 ENCOUNTER — Telehealth: Payer: Self-pay

## 2017-12-11 NOTE — Telephone Encounter (Signed)
  Follow up Call-  Call back number 12/08/2017  Post procedure Call Back phone  # [phone]  Permission to leave phone message Yes  Some recent data might be hidden     Patient questions:  Do you have a fever, pain , or abdominal swelling? No. Pain Score  0 *  Have you tolerated food without any problems? Yes.    Have you been able to return to your normal activities? Yes.    Do you have any questions about your discharge instructions: Diet   No. Medications  No. Follow up visit  No.  Do you have questions or concerns about your Care? No.  Actions: * If pain score is 4 or above: No action needed, pain <4.

## 2017-12-11 NOTE — Telephone Encounter (Signed)
Left message, did not reach for follow up.

## 2017-12-25 ENCOUNTER — Encounter: Payer: Self-pay | Admitting: Gastroenterology

## 2017-12-25 DIAGNOSIS — H401131 Primary open-angle glaucoma, bilateral, mild stage: Secondary | ICD-10-CM | POA: Diagnosis not present

## 2018-01-02 ENCOUNTER — Other Ambulatory Visit: Payer: Self-pay | Admitting: Internal Medicine

## 2018-02-15 ENCOUNTER — Encounter: Payer: Self-pay | Admitting: Internal Medicine

## 2018-03-06 ENCOUNTER — Encounter: Payer: Self-pay | Admitting: Internal Medicine

## 2018-03-06 ENCOUNTER — Ambulatory Visit (INDEPENDENT_AMBULATORY_CARE_PROVIDER_SITE_OTHER): Payer: Medicare HMO | Admitting: Internal Medicine

## 2018-03-06 VITALS — BP 128/78 | HR 60 | Temp 97.4°F | Resp 16 | Ht 67.0 in | Wt 221.6 lb

## 2018-03-06 DIAGNOSIS — E1122 Type 2 diabetes mellitus with diabetic chronic kidney disease: Secondary | ICD-10-CM | POA: Diagnosis not present

## 2018-03-06 DIAGNOSIS — E782 Mixed hyperlipidemia: Secondary | ICD-10-CM

## 2018-03-06 DIAGNOSIS — Z0001 Encounter for general adult medical examination with abnormal findings: Secondary | ICD-10-CM

## 2018-03-06 DIAGNOSIS — E559 Vitamin D deficiency, unspecified: Secondary | ICD-10-CM

## 2018-03-06 DIAGNOSIS — E1140 Type 2 diabetes mellitus with diabetic neuropathy, unspecified: Secondary | ICD-10-CM

## 2018-03-06 DIAGNOSIS — I10 Essential (primary) hypertension: Secondary | ICD-10-CM | POA: Diagnosis not present

## 2018-03-06 DIAGNOSIS — Z1212 Encounter for screening for malignant neoplasm of rectum: Secondary | ICD-10-CM

## 2018-03-06 DIAGNOSIS — Z136 Encounter for screening for cardiovascular disorders: Secondary | ICD-10-CM

## 2018-03-06 DIAGNOSIS — Z1211 Encounter for screening for malignant neoplasm of colon: Secondary | ICD-10-CM

## 2018-03-06 DIAGNOSIS — Z79899 Other long term (current) drug therapy: Secondary | ICD-10-CM

## 2018-03-06 DIAGNOSIS — K219 Gastro-esophageal reflux disease without esophagitis: Secondary | ICD-10-CM

## 2018-03-06 DIAGNOSIS — E039 Hypothyroidism, unspecified: Secondary | ICD-10-CM | POA: Diagnosis not present

## 2018-03-06 DIAGNOSIS — N182 Chronic kidney disease, stage 2 (mild): Secondary | ICD-10-CM | POA: Diagnosis not present

## 2018-03-06 DIAGNOSIS — Z0000 Encounter for general adult medical examination without abnormal findings: Secondary | ICD-10-CM

## 2018-03-06 DIAGNOSIS — Z87891 Personal history of nicotine dependence: Secondary | ICD-10-CM

## 2018-03-06 DIAGNOSIS — M109 Gout, unspecified: Secondary | ICD-10-CM

## 2018-03-06 NOTE — Progress Notes (Addendum)
[facility] Unk Pinto, M.D.     Uvaldo Bristle. Silverio Lay, P.A.-C Liane Comber, [place] [address] [place], [place] [address] Telephone [phone] Telefax [phone] Annual Screening/Preventative Visit & Comprehensive Evaluation &  Examination     This very nice 78 y.o. DBF presents for a Screening /Preventative Visit & comprehensive evaluation and management of multiple medical co-morbidities.  Patient has been followed for HTN, HLD, T2_NIDDM  and Vitamin D Deficiency.      HTN predates circa 1997. Patient's BP has been controlled at home and patient denies any cardiac symptoms as chest pain, palpitations, shortness of breath, dizziness or ankle swelling. Today's BP is at goal -128/78.      Patient's hyperlipidemia is controlled with diet and medications. Patient denies myalgias or other medication SE's. Last lipids were not at goal: Lab Results  Component Value Date   CHOL 221 (H) 10/27/2017   HDL 71 10/27/2017   LDLCALC 129 (H) 10/27/2017   TRIG 104 10/27/2017   CHOLHDL 3.1 10/27/2017      Patient has Morbid Obesity (BMI 34+) and hx/o T2_NIDDM (2013) w/CKD2 & PSN.  Patient denies reactive hypoglycemic symptoms, visual blurring, diabetic polys, but has paresthesias of her feet. She's attempting control with Diet & weight loss. Last A1c was not at goal: Lab Results  Component Value Date   HGBA1C 6.4 (H) 10/27/2017      Finally, patient has history of Vitamin D Deficiency ("26" / 2008)  and last Vitamin D was at goal: Lab Results  Component Value Date   VD25OH 74 07/17/2017   Medication Sig  . ALPRAZolam  1 MG TAKE 1 TABLET AT BEDTIME AS NEEDED   . aspirin 81 MG Take 81 mg by mouth daily.   . bisoprolol-hctz 10-6.25 MG  TAKE 1 TABLET EVERY DAY   . VITAMIN D 5000 UNITS  Take 1 tablet by mouth daily.  Marland Kitchen estradiol  0.5 MG Takes 1/2 tablet daily  . furosemide 40 MG  TAKE 1 TABLET EVERY DAY   .  gabapentin  300 MG  TAKE 1 CAPSULE THREE TIMES DAILY   . XALATAN ophth soln Place 1 drop into both eyes daily.   Marland Kitchen levothyroxine 100 MCG  TAKE 1 TABLET EVERY DAY   . Magnesium 500 MG  Take 2 capsules by mouth daily.   . Omega-3 OMEGA 3 500  Take 1 capsule by mouth daily.  . ranitidine150 MG Take 150 mg by mouth 2 (two) times daily.  . Red Yeast Rice 600 MG  Take 2 capsules by mouth daily.   Marland Kitchen TIMOPTIC ophth soln Place 1 drop into both eyes 2 (two) times daily.    Allergies  Allergen Reactions  . Meloxicam Swelling    Weight gain   Past Medical History:  Diagnosis Date  . Anal fissure   . Arthritis   . Colon polyps    hyperplastic  . Diverticulosis   . GERD (gastroesophageal reflux disease)   . Glaucoma   . Hemorrhoids   . Hyperlipidemia   . Hypertension   . Hypothyroidism   . Prediabetes    Health Maintenance  Topic Date Due  . FOOT EXAM  10/28/2017  . TETANUS/TDAP  10/28/2018 (Originally 04/25/2017)  . OPHTHALMOLOGY EXAM  04/13/2018  . HEMOGLOBIN A1C  04/28/2018  . MAMMOGRAM  08/24/2018  . INFLUENZA VACCINE  Completed  . DEXA SCAN  Completed  . PNA vac Low Risk Adult  Completed  Immunization History  Administered Date(s) Administered  . Influenza, High Dose Seasonal PF 11/24/2017  . Influenza-Unspecified 12/09/2013, 11/14/2015, 11/23/2016  . Pneumococcal Conjugate-13 04/21/2015  . Pneumococcal Polysaccharide-23 05/29/2008  . Td 04/26/2007   Last Colon - 01/07/2018 - Dr Fuller Plan - recc no f/u due to age  Last MGM - 08/23/2017  Past Surgical History:  Procedure Laterality Date  . ABDOMINAL HYSTERECTOMY    . APPENDECTOMY    . BLADDER SUSPENSION    . COLONOSCOPY    . JOINT REPLACEMENT  2007   rt hip replacement  . KNEE ARTHROSCOPY  03/28/2011   Procedure: ARTHROSCOPY KNEE;  Surgeon: Kerin Salen, MD;  Location: [facility];  Service: Orthopedics;  Laterality: Right;  with partial lateral meniscectomy, Debridement of Chondromalacia   Family  History  Problem Relation Age of Onset  . Esophageal cancer Other        nefew- age 91  . Colon polyps Sister   . Diabetes Sister   . Esophageal cancer Other   . Colon cancer Neg Hx   . Rectal cancer Neg Hx   . Stomach cancer Neg Hx   . Breast cancer Neg Hx    Social History   Tobacco Use  . Smoking status: Former Smoker    Last attempt to quit: 03/22/1996    Years since quitting: 21.9  . Smokeless tobacco: Never Used  Substance Use Topics  . Alcohol use: Yes    Alcohol/week: 2.0 standard drinks    Types: 2 drink(s) per week    Comment: occ  . Drug use: No    ROS Constitutional: Denies fever, chills, weight loss/gain, headaches, insomnia,  night sweats, and change in appetite. Does c/o fatigue. Eyes: Denies redness, blurred vision, diplopia, discharge, itchy, watery eyes.  ENT: Denies discharge, congestion, post nasal drip, epistaxis, sore throat, earache, hearing loss, dental pain, Tinnitus, Vertigo, Sinus pain, snoring.  Cardio: Denies chest pain, palpitations, irregular heartbeat, syncope, dyspnea, diaphoresis, orthopnea, PND, claudication, edema Respiratory: denies cough, dyspnea, DOE, pleurisy, hoarseness, laryngitis, wheezing.  Gastrointestinal: Denies dysphagia, heartburn, reflux, water brash, pain, cramps, nausea, vomiting, bloating, diarrhea, constipation, hematemesis, melena, hematochezia, jaundice, hemorrhoids Genitourinary: Denies dysuria, frequency, urgency, nocturia, hesitancy, discharge, hematuria, flank pain Breast: Breast lumps, nipple discharge, bleeding.  Musculoskeletal: Denies arthralgia, myalgia, stiffness, Jt. Swelling, pain, limp, and strain/sprain. Denies falls. Skin: Denies puritis, rash, hives, warts, acne, eczema, changing in skin lesion Neuro: No weakness, tremor, incoordination, spasms, paresthesia, pain Psychiatric: Denies confusion, memory loss, sensory loss. Denies Depression. Endocrine: Denies change in weight, skin, hair change, nocturia, and  paresthesia, diabetic polys, visual blurring, hyper / hypo glycemic episodes.  Heme/Lymph: No excessive bleeding, bruising, enlarged lymph nodes.  Physical Exam  BP 128/78   Pulse 60   Temp (!) 97.4 F (36.3 C)   Resp 16   Ht 5\' 7"  (1.702 m)   Wt 221 lb 9.6 oz (100.5 kg)   BMI 34.71 kg/m   General Appearance: Well nourished, well groomed and in no apparent distress.  Eyes: PERRLA, EOMs, conjunctiva no swelling or erythema, normal fundi and vessels. Sinuses: No frontal/maxillary tenderness ENT/Mouth: EACs patent / TMs  nl. Nares clear without erythema, swelling, mucoid exudates. Oral hygiene is good. No erythema, swelling, or exudate. Tongue normal, non-obstructing. Tonsils not swollen or erythematous. Hearing normal.  Neck: Supple, thyroid not palpable. No bruits, nodes or JVD. Respiratory: Respiratory effort normal.  BS equal and clear bilateral without rales, rhonci, wheezing or stridor. Cardio: Heart sounds are normal with regular  rate and rhythm and no murmurs, rubs or gallops. Peripheral pulses are normal and equal bilaterally without edema. No aortic or femoral bruits. Chest: symmetric with normal excursions and percussion. Breasts: Symmetric, without lumps, nipple discharge, retractions, or fibrocystic changes.  Abdomen: Flat, soft with bowel sounds active. Nontender, no guarding, rebound, hernias, masses, or organomegaly.  Lymphatics: Non tender without lymphadenopathy.  Musculoskeletal: Full ROM all peripheral extremities, joint stability, 5/5 strength, and normal gait. Skin: Warm and dry without rashes, lesions, cyanosis, clubbing or  ecchymosis.  Neuro: Cranial nerves intact, reflexes equal bilaterally. Normal muscle tone, no cerebellar symptoms. Sensation intact.  Pysch: Alert and oriented X 3, normal affect, Insight and Judgment appropriate.   Assessment and Plan  1. Annual Preventative Screening Examination  2. Essential hypertension  - EKG 12-Lead - Urinalysis,  Routine w reflex microscopic - Microalbumin / creatinine urine ratio - CBC with Differential/Platelet - COMPLETE METABOLIC PANEL WITH GFR - Magnesium - TSH  3. Hyperlipidemia, mixed  - EKG 12-Lead - Lipid panel - TSH  4. Controlled type 2 diabetes mellitus with stage 2 chronic kidney disease, without long-term current use of insulin (HCC)  - EKG 12-Lead - Urinalysis, Routine w reflex microscopic - Microalbumin / creatinine urine ratio - Hemoglobin A1c - Insulin, random  5. Vitamin D deficiency  - VITAMIN D 25 Hydroxyl  6. Gout  - Uric acid  7. Gastroesophageal reflux disease  - CBC with Differential/Platelet  8. Hypothyroidism  - TSH  9. Type 2 diabetes mellitus with diabetic neuropathy, without long-term current use of insulin (HCC)  - Hemoglobin A1c  10. Screening for ischemic heart disease  - EKG 12-Lead  11. Former smoker  - EKG 12-Lead  12. Screening for colorectal cancer  - POC Hemoccult Bld/Stl (3-Cd Home Screen); Future  13. Medication management  - Urinalysis, Routine w reflex microscopic - Microalbumin / creatinine urine ratio - Uric acid - CBC with Differential/Platelet - COMPLETE METABOLIC PANEL WITH GFR - Magnesium - Lipid panel - TSH - Hemoglobin A1c - Insulin, random - VITAMIN D 25 Hydroxyl        Patient was counseled in prudent diet to achieve/maintain BMI less than 25 for weight control, BP monitoring, regular exercise and medications. Discussed med's effects and SE's. Screening labs and tests as requested with regular follow-up as recommended. Over 40 minutes of exam, counseling, chart review and high complex critical decision making was performed.

## 2018-03-06 NOTE — Patient Instructions (Signed)

## 2018-03-07 LAB — CBC WITH DIFFERENTIAL/PLATELET
Absolute Monocytes: 436 cells/uL (ref 200–950)
BASOS ABS: 31 {cells}/uL (ref 0–200)
Basophils Relative: 0.7 %
EOS ABS: 123 {cells}/uL (ref 15–500)
Eosinophils Relative: 2.8 %
HCT: 42 % (ref 35.0–45.0)
Hemoglobin: 13.9 g/dL (ref 11.7–15.5)
Lymphs Abs: 2077 cells/uL (ref 850–3900)
MCH: 27.3 pg (ref 27.0–33.0)
MCHC: 33.1 g/dL (ref 32.0–36.0)
MCV: 82.5 fL (ref 80.0–100.0)
MONOS PCT: 9.9 %
MPV: 10.1 fL (ref 7.5–12.5)
NEUTROS PCT: 39.4 %
Neutro Abs: 1734 cells/uL (ref 1500–7800)
PLATELETS: 255 10*3/uL (ref 140–400)
RBC: 5.09 10*6/uL (ref 3.80–5.10)
RDW: 13.5 % (ref 11.0–15.0)
TOTAL LYMPHOCYTE: 47.2 %
WBC: 4.4 10*3/uL (ref 3.8–10.8)

## 2018-03-07 LAB — COMPLETE METABOLIC PANEL WITH GFR
AG Ratio: 1.7 (calc) (ref 1.0–2.5)
ALT: 10 U/L (ref 6–29)
AST: 14 U/L (ref 10–35)
Albumin: 4.4 g/dL (ref 3.6–5.1)
Alkaline phosphatase (APISO): 51 U/L (ref 33–130)
BUN: 13 mg/dL (ref 7–25)
CO2: 32 mmol/L (ref 20–32)
Calcium: 10.3 mg/dL (ref 8.6–10.4)
Chloride: 100 mmol/L (ref 98–110)
Creat: 0.83 mg/dL (ref 0.60–0.93)
GFR, Est African American: 79 mL/min/{1.73_m2} (ref >=60)
GFR, Est Non African American: 68 mL/min/{1.73_m2} (ref >=60)
Globulin: 2.6 g/dL (calc) (ref 1.9–3.7)
Glucose, Bld: 94 mg/dL (ref 65–99)
POTASSIUM: 3.8 mmol/L (ref 3.5–5.3)
Sodium: 141 mmol/L (ref 135–146)
Total Bilirubin: 0.8 mg/dL (ref 0.2–1.2)
Total Protein: 7 g/dL (ref 6.1–8.1)

## 2018-03-07 LAB — MICROALBUMIN / CREATININE URINE RATIO
CREATININE, URINE: 78 mg/dL (ref 20–275)
MICROALB UR: 1 mg/dL
Microalb Creat Ratio: 13 mcg/mg creat (ref <30)

## 2018-03-07 LAB — URINALYSIS, ROUTINE W REFLEX MICROSCOPIC
Bilirubin Urine: NEGATIVE
GLUCOSE, UA: NEGATIVE
Hgb urine dipstick: NEGATIVE
Ketones, ur: NEGATIVE
LEUKOCYTES UA: NEGATIVE
Nitrite: NEGATIVE
PH: 7.5 (ref 5.0–8.0)
Protein, ur: NEGATIVE
SPECIFIC GRAVITY, URINE: 1.012 (ref 1.001–1.03)

## 2018-03-07 LAB — LIPID PANEL
CHOLESTEROL: 199 mg/dL (ref <200)
HDL: 80 mg/dL (ref >50)
LDL Cholesterol (Calc): 96 mg/dL (calc)
Non-HDL Cholesterol (Calc): 119 mg/dL (calc) (ref <130)
Total CHOL/HDL Ratio: 2.5 (calc) (ref <5.0)
Triglycerides: 134 mg/dL (ref <150)

## 2018-03-07 LAB — TSH: TSH: 3.49 mIU/L (ref 0.40–4.50)

## 2018-03-07 LAB — HEMOGLOBIN A1C
EAG (MMOL/L): 7.4 (calc)
Hgb A1c MFr Bld: 6.3 % of total Hgb — ABNORMAL HIGH (ref <5.7)
Mean Plasma Glucose: 134 (calc)

## 2018-03-07 LAB — MAGNESIUM: Magnesium: 2 mg/dL (ref 1.5–2.5)

## 2018-03-07 LAB — URIC ACID: URIC ACID, SERUM: 5.7 mg/dL (ref 2.5–7.0)

## 2018-03-07 LAB — VITAMIN D 25 HYDROXY (VIT D DEFICIENCY, FRACTURES): Vit D, 25-Hydroxy: 67 ng/mL (ref 30–100)

## 2018-03-07 LAB — INSULIN, RANDOM: Insulin: 2.4 u[IU]/mL (ref 2.0–19.6)

## 2018-03-08 DIAGNOSIS — M1711 Unilateral primary osteoarthritis, right knee: Secondary | ICD-10-CM | POA: Diagnosis not present

## 2018-03-11 ENCOUNTER — Encounter: Payer: Self-pay | Admitting: Internal Medicine

## 2018-03-14 ENCOUNTER — Other Ambulatory Visit: Payer: Self-pay | Admitting: Internal Medicine

## 2018-03-27 ENCOUNTER — Other Ambulatory Visit: Payer: Self-pay | Admitting: Orthopaedic Surgery

## 2018-04-10 DIAGNOSIS — M25521 Pain in right elbow: Secondary | ICD-10-CM | POA: Diagnosis not present

## 2018-04-10 DIAGNOSIS — G5601 Carpal tunnel syndrome, right upper limb: Secondary | ICD-10-CM | POA: Diagnosis not present

## 2018-04-16 DIAGNOSIS — R200 Anesthesia of skin: Secondary | ICD-10-CM | POA: Diagnosis not present

## 2018-04-17 DIAGNOSIS — H401132 Primary open-angle glaucoma, bilateral, moderate stage: Secondary | ICD-10-CM | POA: Diagnosis not present

## 2018-05-01 ENCOUNTER — Ambulatory Visit: Admit: 2018-05-01 | Payer: Medicare HMO | Admitting: Orthopaedic Surgery

## 2018-05-01 DIAGNOSIS — R200 Anesthesia of skin: Secondary | ICD-10-CM | POA: Diagnosis not present

## 2018-05-01 SURGERY — ARTHROPLASTY, KNEE, TOTAL
Anesthesia: Spinal | Laterality: Right

## 2018-06-14 ENCOUNTER — Other Ambulatory Visit: Payer: Self-pay | Admitting: Internal Medicine

## 2018-06-18 ENCOUNTER — Other Ambulatory Visit: Payer: Self-pay | Admitting: Internal Medicine

## 2018-06-18 ENCOUNTER — Other Ambulatory Visit: Payer: Self-pay | Admitting: Physician Assistant

## 2018-06-18 MED ORDER — ALPRAZOLAM 1 MG PO TABS: ORAL_TABLET | ORAL | 0 refills | Status: DC

## 2018-06-18 MED ORDER — ALPRAZOLAM 1 MG PO TABS: 1.0000 mg | ORAL_TABLET | Freq: Every evening | ORAL | 1 refills | Status: DC | PRN

## 2018-06-19 ENCOUNTER — Other Ambulatory Visit: Payer: Self-pay | Admitting: Internal Medicine

## 2018-06-19 DIAGNOSIS — R7309 Other abnormal glucose: Secondary | ICD-10-CM

## 2018-06-19 DIAGNOSIS — E119 Type 2 diabetes mellitus without complications: Secondary | ICD-10-CM | POA: Insufficient documentation

## 2018-06-19 MED ORDER — ALPRAZOLAM 1 MG PO TABS: ORAL_TABLET | ORAL | 0 refills | Status: DC

## 2018-06-19 NOTE — Progress Notes (Signed)
MEDICARE ANNUAL WELLNESS VISIT AND FOLLOW UP  Assessment:   Encounter for Medicare annual wellness exam  Essential hypertension - continue medications, DASH diet, exercise and monitor at home. Call if greater than 130/80. -     CBC with Differential/Platelet -     CMP/GFR -     TSH  Gastroesophageal reflux disease, esophagitis presence not specified Off of medication; does well with baking soda PRN for rare symptoms   Other abnormal glucose (prediabetes) Discussed disease and risks Discussed diet/exercise, weight management  A1C -     Hemoglobin A1c  CKD II  Increase fluids, avoid NSAIDS, monitor sugars, will monitor  Hypothyroidism, unspecified type Hypothyroidism-check TSH level, continue medications the same, reminded to take on an empty stomach 30-53mins before food.  -     TSH  Peripheral sensory neuropathy -     gabapentin (NEURONTIN) 300 MG capsule; TAKE 1-2 CAPSULES TID PRN DAILY  FOR  NEUROPATHY  PAIN  Osteoarthritis, unspecified osteoarthritis type, unspecified site Followed by Dr. Mayer Camel, pending surgery   Mixed hyperlipidemia -continue medications, check lipids, decrease fatty foods, increase activity.   obesity (BMI 30-34.9) - long discussion about weight loss, diet, and exercise -recommended diet heavy in fruits and veggies and low in animal meats, cheeses, and dairy products - restart swimming regularly after knee surgery and covid 19 - try eating dinner earlier, eat off of salad plate, wait 10-15 min prior to getting seconds - weight loss recommended prior to knee surgery  Vitamin D deficiency Continue supplement  Medication management -     Magnesium  Glaucoma, unspecified glaucoma type, unspecified laterality Continue eye doctor follow up  Estrogen deficiency Plan to taper off this winter    Future Appointments  Date Time Provider [place]  09/20/2018  2:30 PM Unk Pinto, MD [facility] None  03/25/2019  2:00 PM Unk Pinto, MD [facility] None     Plan:   During the course of the visit the patient was educated and counseled about appropriate screening and preventive services including:    Pneumococcal vaccine   Influenza vaccine  Td vaccine  Screening electrocardiogram  Screening mammography  Bone densitometry screening  Colorectal cancer screening  Diabetes screening  Glaucoma screening  Nutrition counseling   Advanced directives: given info/requested   Subjective:   Rebecca Graves is a 78 y.o. female who presents for Medicare Annual Wellness Visit and 3 month follow up on hypertension, prediabetes, hyperlipidemia, vitamin D def.   She is established with Dr. Mayer Camel for right knee arthritis, discussing surgery but was postponed due to covid 19.   S/p hysterectomy in 1980s. She is on estrace and she is on 2 bASA, normal MGM in 07/2017.  She was tapering down on estrogen per our instructions but was having bad hot flashes and very anxious with covid 19 going on, would prefer to try at a later date.   BMI is Body mass index is 34.86 kg/m., she has not been working on diet, stopped swimming after her niece passed away who she was swimming with. She admits she eats dinner too late, too much.  Wt Readings from Last 3 Encounters:  06/20/18 222 lb 9.6 oz (101 kg)  03/06/18 221 lb 9.6 oz (100.5 kg)  12/08/17 230 lb (104.3 kg)   Her blood pressure has been controlled at home, today their BP is BP: 108/68 She does workout. She denies chest pain, shortness of breath, dizziness.   She is not on cholesterol medication (on RYRS) and  denies myalgias. Her cholesterol is at goal. The cholesterol last visit was:   Lab Results  Component Value Date   CHOL 199 03/06/2018   HDL 80 03/06/2018   LDLCALC 96 03/06/2018   TRIG 134 03/06/2018   CHOLHDL 2.5 03/06/2018   She has been working on diet and exercise for prediabetes, she has cut out soda, switched to diet tea, she is not on ASA and she  is not on ACE, and denies increased appetite, nausea, polydipsia, polyuria and vomiting. She does have neuropathy, does well during the day, using gabapentin at night. She does check fasting sugars, typically 100-130 with rare outliers ~150 and 90s. Last A1C in the office was:  Lab Results  Component Value Date   HGBA1C 6.3 (H) 03/06/2018   She has been pushing fluid intake:   Lab Results  Component Value Date   GFRAA 79 03/06/2018   Patient is on Vitamin D supplement. Lab Results  Component Value Date   VD25OH 67 03/06/2018     She is on thyroid medication. Her medication was not changed last visit, on 1/2  pill on F and 1 pill the other days. Patient denies nervousness, palpitations and weight changes.  Lab Results  Component Value Date   TSH 3.49 03/06/2018     Medication Review Current Outpatient Medications on File Prior to Visit  Medication Sig Dispense Refill  . ACCU-CHEK AVIVA PLUS test strip     . ALPRAZolam (XANAX) 1 MG tablet Take 1/2-1 tablet at Bedtime  ONLY if needed for Sleep &  limit to 5 days /week to avoid addiction 30 tablet 0  . aspirin 81 MG tablet Take 81 mg by mouth 2 (two) times a day.     . ASSURE COMFORT LANCETS 30G MISC     . bisoprolol-hydrochlorothiazide (ZIAC) 10-6.25 MG tablet TAKE 1 TABLET EVERY DAY FOR BLOOD PRESSURE 90 tablet 1  . Blood Glucose Calibration (ACCU-CHEK AVIVA) SOLN     . Cholecalciferol (VITAMIN D-3) 5000 UNITS TABS Take 1 tablet by mouth daily.    Marland Kitchen estradiol (ESTRACE) 0.5 MG tablet TAKE 1/2 TABLET MONDAY, WEDNESDAY, FRIDAY  AND 1 TABLET TUESDAY, THURSDAY, SATURDAY, SUNDAY (NEED TO WEAN DOWN) (Patient taking differently: Take 0.5 mg by mouth. Takes one tablet daily) 70 tablet 1  . furosemide (LASIX) 40 MG tablet TAKE 1 TABLET EVERY DAY FOR BLOOD PRESSURE/FLUID 90 tablet 1  . Lancet Devices (ADJUSTABLE LANCING DEVICE) MISC     . latanoprost (XALATAN) 0.005 % ophthalmic solution Place 1 drop into both eyes daily.     Marland Kitchen levothyroxine  (SYNTHROID) 100 MCG tablet TAKE 1 TABLET EVERY DAY  BEFORE  BREAKFAST 90 tablet 2  . Magnesium 500 MG CAPS Take 2 capsules by mouth daily.     . Omega-3 Fatty Acids (OMEGA 3 500 PO) Take 1 capsule by mouth daily.    . Red Yeast Rice 600 MG CAPS Take 2 capsules by mouth daily.     . timolol (TIMOPTIC) 0.5 % ophthalmic solution Place 1 drop into both eyes 2 (two) times daily.      No current facility-administered medications on file prior to visit.     Current Problems (verified) Patient Active Problem List   Diagnosis Date Noted  . Other abnormal glucose (prediabetes) 06/19/2018  . Insomnia 10/26/2017  . Peripheral neuropathy 01/18/2015  . CKD (chronic kidney disease) stage 2, GFR 60-89 ml/min 01/18/2015  . Obesity (BMI 30.0-34.9) 07/03/2014  . Vitamin D deficiency 03/31/2013  . Medication  management 03/31/2013  . DJD   . Glaucoma   . Hypothyroidism   . Hypertension   . Hyperlipidemia   . GERD (gastroesophageal reflux disease)   . Tear of lateral meniscus of knee, current 03/27/2011    Screening Tests Immunization History  Administered Date(s) Administered  . Influenza, High Dose Seasonal PF 11/24/2017  . Influenza-Unspecified 12/09/2013, 11/14/2015, 11/23/2016  . Pneumococcal Conjugate-13 04/21/2015  . Pneumococcal Polysaccharide-23 05/29/2008  . Td 04/26/2007    Preventative care: Last colonoscopy: Dr. Fuller Plan on 11/2017 Last mammogram: 07/2017 Last pap smear/pelvic exam: remote, declines DEXA: 2017, normal, declines today, will get later US carotid 2017  Prior vaccinations: TD or Tdap: 2009, declines today, will call insurance  Influenza: 2019 Pneumococcal: 2010 Prevnar 13: 2017 Shingles/Zostavax: declines due to price  Names of Other Physician/Practitioners you currently use: 1. [facility]- here for primary care 2. Dr. Peter Garter, eye doctor,has appointment coming up, visits every 3-4 months for glacoma, 2019, abstracted for  04/13/2017 diabetic eye exam  3. Can't remember name, dentist, last visit 2017  Patient Care Team: Unk Pinto, MD as PCP - General (Internal Medicine) Camillo Flaming, [place] as Referring Physician (Optometry) Suella Broad, MD as Consulting Physician (Physical Medicine and Rehabilitation) Ladene Artist, MD as Consulting Physician (Gastroenterology) Myrlene Broker, MD as Attending Physician (Urology)   Allergies Allergies  Allergen Reactions  . Meloxicam Swelling    Weight gain    SURGICAL HISTORY She  has a past surgical history that includes Abdominal hysterectomy; Appendectomy; Joint replacement (2007); Bladder suspension; Knee arthroscopy (03/28/2011); and Colonoscopy. FAMILY HISTORY Her family history includes Colon polyps in her sister; Diabetes in her sister; Esophageal cancer in some other family members. SOCIAL HISTORY She  reports that she quit smoking about 22 years ago. She has never used smokeless tobacco. She reports current alcohol use of about 2.0 standard drinks of alcohol per week. She reports that she does not use drugs.  MEDICARE WELLNESS OBJECTIVES: Physical activity: Current Exercise Habits: Home exercise routine(was going to water aerobics but not since covid 19), Type of exercise: stretching;strength training/weights, Time (Minutes): 10, Frequency (Times/Week): 4, Weekly Exercise (Minutes/Week): 40, Intensity: Mild, Exercise limited by: orthopedic condition(s) Cardiac risk factors: Cardiac Risk Factors include: advanced age (>10men, >51 women);dyslipidemia;hypertension;obesity (BMI >30kg/m2);sedentary lifestyle Depression/mood screen:   Depression screen [facility] 2/9 06/20/2018  Decreased Interest 0  Down, Depressed, Hopeless 0  PHQ - 2 Score 0    ADLs:  In your present state of health, do you have any difficulty performing the following activities: 06/20/2018 03/11/2018  Hearing? N N  Vision? N N  Difficulty concentrating or making decisions? N N   Walking or climbing stairs? N N  Dressing or bathing? N N  Doing errands, shopping? N N  Some recent data might be hidden     Cognitive Testing  Alert? Yes  Normal Appearance?Yes  Oriented to person? Yes  Place? Yes   Time? Yes  Recall of three objects?  Yes  Can perform simple calculations? Yes  Displays appropriate judgment?Yes  Can read the correct time from a watch face?Yes  EOL planning: Does Patient Have a Medical Advance Directive?: Yes Type of Advance Directive: Living will Does patient want to make changes to medical advance directive?: Yes (MAU/Ambulatory/Procedural Areas - Information given)   Objective:   Blood pressure 108/68, pulse (!) 55, temperature (!) 97.5 F (36.4 C), height 5\' 7"  (1.702 m), weight 222 lb 9.6 oz (101 kg), SpO2 98 %.  Body mass index is 34.86 kg/m.  General appearance: alert, no distress, WD/WN,  female HEENT: normocephalic, sclerae anicteric, TMs pearly, nares patent, no discharge or erythema, pharynx normal Oral cavity: MMM, no lesions Neck: supple, no lymphadenopathy, no thyromegaly, no masses Heart: RRR, normal S1, S2, no murmurs Lungs: CTA bilaterally, no wheezes, rhonchi, or rales Abdomen: +bs, soft, non tender, non distended, no masses, no hepatomegaly, no splenomegaly Musculoskeletal: nontender, mild effusion and bony enlargement to right knee Extremities: no edema, no cyanosis, no clubbing Pulses: 2+ symmetric, upper and lower extremities, normal cap refill Neurological: alert, oriented x 3, CN2-12 intact, strength normal upper extremities and lower extremities, sensation normal throughout, DTRs 2+ throughout, no cerebellar signs, gait mildly antalgic Psychiatric: normal affect, behavior normal, pleasant  Breast: defer Gyn: defer Rectal: defer  Medicare Attestation I have personally reviewed: The patient's medical and social history Their use of alcohol, tobacco or illicit drugs Their current medications and supplements The  patient's functional ability including ADLs,fall risks, home safety risks, cognitive, and hearing and visual impairment Diet and physical activities Evidence for depression or mood disorders  The patient's weight, height, BMI, and visual acuity have been recorded in the chart.  I have made referrals, counseling, and provided education to the patient based on review of the above and I have provided the patient with a written personalized care plan for preventive services.     Izora Ribas, NP   06/20/2018

## 2018-06-20 ENCOUNTER — Encounter: Payer: Self-pay | Admitting: Adult Health

## 2018-06-20 ENCOUNTER — Other Ambulatory Visit: Payer: Self-pay

## 2018-06-20 ENCOUNTER — Ambulatory Visit (INDEPENDENT_AMBULATORY_CARE_PROVIDER_SITE_OTHER): Payer: Medicare HMO | Admitting: Adult Health

## 2018-06-20 VITALS — BP 108/68 | HR 55 | Temp 97.5°F | Ht 67.0 in | Wt 222.6 lb

## 2018-06-20 DIAGNOSIS — E66811 Obesity, class 1: Secondary | ICD-10-CM

## 2018-06-20 DIAGNOSIS — E782 Mixed hyperlipidemia: Secondary | ICD-10-CM | POA: Diagnosis not present

## 2018-06-20 DIAGNOSIS — E039 Hypothyroidism, unspecified: Secondary | ICD-10-CM | POA: Diagnosis not present

## 2018-06-20 DIAGNOSIS — Z0001 Encounter for general adult medical examination with abnormal findings: Secondary | ICD-10-CM

## 2018-06-20 DIAGNOSIS — I10 Essential (primary) hypertension: Secondary | ICD-10-CM

## 2018-06-20 DIAGNOSIS — G609 Hereditary and idiopathic neuropathy, unspecified: Secondary | ICD-10-CM

## 2018-06-20 DIAGNOSIS — N182 Chronic kidney disease, stage 2 (mild): Secondary | ICD-10-CM

## 2018-06-20 DIAGNOSIS — E559 Vitamin D deficiency, unspecified: Secondary | ICD-10-CM | POA: Diagnosis not present

## 2018-06-20 DIAGNOSIS — E669 Obesity, unspecified: Secondary | ICD-10-CM | POA: Diagnosis not present

## 2018-06-20 DIAGNOSIS — Z0000 Encounter for general adult medical examination without abnormal findings: Secondary | ICD-10-CM

## 2018-06-20 DIAGNOSIS — M1990 Unspecified osteoarthritis, unspecified site: Secondary | ICD-10-CM | POA: Diagnosis not present

## 2018-06-20 DIAGNOSIS — R7309 Other abnormal glucose: Secondary | ICD-10-CM

## 2018-06-20 DIAGNOSIS — H409 Unspecified glaucoma: Secondary | ICD-10-CM

## 2018-06-20 DIAGNOSIS — R6889 Other general symptoms and signs: Secondary | ICD-10-CM

## 2018-06-20 DIAGNOSIS — K219 Gastro-esophageal reflux disease without esophagitis: Secondary | ICD-10-CM

## 2018-06-20 DIAGNOSIS — G4700 Insomnia, unspecified: Secondary | ICD-10-CM

## 2018-06-20 DIAGNOSIS — Z79899 Other long term (current) drug therapy: Secondary | ICD-10-CM

## 2018-06-20 MED ORDER — GABAPENTIN 300 MG PO CAPS: ORAL_CAPSULE | ORAL | 1 refills | Status: DC

## 2018-06-20 NOTE — Patient Instructions (Addendum)
  Rebecca Graves , Thank you for taking time to come for your Medicare Wellness Visit. I appreciate your ongoing commitment to your health goals. Please review the following plan we discussed and let me know if I can assist you in the future.   These are the goals we discussed: Goals    . Exercise 3x per week     . Weight (lb) < 210 lb (95.3 kg)       This is a list of the screening recommended for you and due dates:  Health Maintenance  Topic Date Due  . Tetanus Vaccine  10/28/2018*  . Mammogram  08/24/2018  . Flu Shot  09/29/2018  . DEXA scan (bone density measurement)  Completed  . Pneumonia vaccines  Completed  *Topic was postponed. The date shown is not the original due date.    Try drinking a glass of water before dinner Start with a full salad plate - take your time eating -  wait 10-15 min before going back for seconds    Drink 1/2 your body weight in fluid ounces of water daily; drink a tall glass of water 30 min before meals  Don't eat until you're stuffed- listen to your stomach and eat until you are 80% full   Try eating off of a salad plate; wait 10 min after finishing before going back for seconds  Start by eating the vegetables on your plate; aim for 50% of your meals to be fruits or vegetables  Then eat your protein - lean meats (grass fed if possible), fish, beans, nuts in moderation  Eat your carbs/starch last ONLY if you still are hungry. If you can, stop before finishing it all  Avoid sugar and flour - the closer it looks to it's original form in nature, typically the better it is for you  Splurge in moderation - "assign" days when you get to splurge and have the "bad stuff" - I like to follow a 80% - 20% plan- "good" choices 80 % of the time, "bad" choices in moderation 20% of the time  Simple equation is: Calories out > calories in = weight loss - even if you eat the bad stuff, if you limit portions, you will still lose weight    Know what a healthy  weight is for you (roughly BMI <25) and aim to maintain this  Aim for 7+ servings of fruits and vegetables daily  65-80+ fluid ounces of water or unsweet tea for healthy kidneys  Limit to max 1 drink of alcohol per day; avoid smoking/tobacco  Limit animal fats in diet for cholesterol and heart health - choose grass fed whenever available  Avoid highly processed foods, and foods high in saturated/trans fats  Aim for low stress - take time to unwind and care for your mental health  Aim for 150 min of moderate intensity exercise weekly for heart health, and weights twice weekly for bone health  Aim for 7-9 hours of sleep daily

## 2018-06-21 ENCOUNTER — Other Ambulatory Visit: Payer: Self-pay | Admitting: Adult Health

## 2018-06-21 DIAGNOSIS — N183 Chronic kidney disease, stage 3 unspecified: Secondary | ICD-10-CM

## 2018-06-21 LAB — COMPLETE METABOLIC PANEL WITH GFR
AG Ratio: 1.6 (calc) (ref 1.0–2.5)
ALT: 11 U/L (ref 6–29)
AST: 14 U/L (ref 10–35)
Albumin: 4.5 g/dL (ref 3.6–5.1)
Alkaline phosphatase (APISO): 53 U/L (ref 37–153)
BUN/Creatinine Ratio: 17 (calc) (ref 6–22)
BUN: 18 mg/dL (ref 7–25)
CO2: 33 mmol/L — ABNORMAL HIGH (ref 20–32)
Calcium: 10.6 mg/dL — ABNORMAL HIGH (ref 8.6–10.4)
Chloride: 101 mmol/L (ref 98–110)
Creat: 1.07 mg/dL — ABNORMAL HIGH (ref 0.60–0.93)
GFR, Est African American: 58 mL/min/{1.73_m2} — ABNORMAL LOW (ref >=60)
GFR, Est Non African American: 50 mL/min/{1.73_m2} — ABNORMAL LOW (ref >=60)
Globulin: 2.8 g/dL (calc) (ref 1.9–3.7)
Glucose, Bld: 91 mg/dL (ref 65–99)
Potassium: 4 mmol/L (ref 3.5–5.3)
Sodium: 141 mmol/L (ref 135–146)
Total Bilirubin: 0.9 mg/dL (ref 0.2–1.2)
Total Protein: 7.3 g/dL (ref 6.1–8.1)

## 2018-06-21 LAB — CBC WITH DIFFERENTIAL/PLATELET
Absolute Monocytes: 489 cells/uL (ref 200–950)
Basophils Absolute: 28 cells/uL (ref 0–200)
Basophils Relative: 0.6 %
Eosinophils Absolute: 127 cells/uL (ref 15–500)
Eosinophils Relative: 2.7 %
HCT: 44.6 % (ref 35.0–45.0)
Hemoglobin: 15.1 g/dL (ref 11.7–15.5)
Lymphs Abs: 2317 cells/uL (ref 850–3900)
MCH: 27.5 pg (ref 27.0–33.0)
MCHC: 33.9 g/dL (ref 32.0–36.0)
MCV: 81.2 fL (ref 80.0–100.0)
MPV: 10 fL (ref 7.5–12.5)
Monocytes Relative: 10.4 %
Neutro Abs: 1739 cells/uL (ref 1500–7800)
Neutrophils Relative %: 37 %
Platelets: 274 10*3/uL (ref 140–400)
RBC: 5.49 10*6/uL — ABNORMAL HIGH (ref 3.80–5.10)
RDW: 13.2 % (ref 11.0–15.0)
Total Lymphocyte: 49.3 %
WBC: 4.7 10*3/uL (ref 3.8–10.8)

## 2018-06-21 LAB — LIPID PANEL
Cholesterol: 192 mg/dL (ref <200)
HDL: 73 mg/dL (ref >=50)
LDL Cholesterol (Calc): 94 mg/dL (calc)
Non-HDL Cholesterol (Calc): 119 mg/dL (calc) (ref <130)
Total CHOL/HDL Ratio: 2.6 (calc) (ref <5.0)
Triglycerides: 149 mg/dL (ref <150)

## 2018-06-21 LAB — TSH: TSH: 4.05 mIU/L (ref 0.40–4.50)

## 2018-06-21 LAB — HEMOGLOBIN A1C
Hgb A1c MFr Bld: 6.4 % of total Hgb — ABNORMAL HIGH (ref <5.7)
Mean Plasma Glucose: 137 (calc)
eAG (mmol/L): 7.6 (calc)

## 2018-06-21 LAB — MAGNESIUM: Magnesium: 2.2 mg/dL (ref 1.5–2.5)

## 2018-07-19 ENCOUNTER — Other Ambulatory Visit: Payer: Self-pay

## 2018-07-19 ENCOUNTER — Ambulatory Visit (INDEPENDENT_AMBULATORY_CARE_PROVIDER_SITE_OTHER): Payer: Medicare HMO | Admitting: *Deleted

## 2018-07-19 DIAGNOSIS — N183 Chronic kidney disease, stage 3 unspecified: Secondary | ICD-10-CM

## 2018-07-19 NOTE — Progress Notes (Signed)
Patient is here to check a BMET and a PTH, intact and calcium.  She states she does not take calcium supplements. She is drinking 64 to 80 ounces of water daily.

## 2018-07-20 LAB — BASIC METABOLIC PANEL WITH GFR
BUN: 15 mg/dL (ref 7–25)
CO2: 28 mmol/L (ref 20–32)
Calcium: 10.2 mg/dL (ref 8.6–10.4)
Chloride: 103 mmol/L (ref 98–110)
Creat: 0.91 mg/dL (ref 0.60–0.93)
GFR, Est African American: 71 mL/min/{1.73_m2} (ref >=60)
GFR, Est Non African American: 61 mL/min/{1.73_m2} (ref >=60)
Glucose, Bld: 91 mg/dL (ref 65–99)
Potassium: 4.3 mmol/L (ref 3.5–5.3)
Sodium: 139 mmol/L (ref 135–146)

## 2018-07-20 LAB — PTH, INTACT AND CALCIUM
Calcium: 10.2 mg/dL (ref 8.6–10.4)
PTH: 42 pg/mL (ref 14–64)

## 2018-07-25 ENCOUNTER — Other Ambulatory Visit: Payer: Self-pay | Admitting: Internal Medicine

## 2018-07-25 DIAGNOSIS — Z1231 Encounter for screening mammogram for malignant neoplasm of breast: Secondary | ICD-10-CM

## 2018-08-10 ENCOUNTER — Other Ambulatory Visit: Payer: Self-pay | Admitting: Internal Medicine

## 2018-09-10 ENCOUNTER — Other Ambulatory Visit: Payer: Self-pay

## 2018-09-10 ENCOUNTER — Ambulatory Visit
Admission: RE | Admit: 2018-09-10 | Discharge: 2018-09-10 | Disposition: A | Discharge status: Home / Self Care | Payer: Medicare HMO | Source: Ambulatory Visit | Attending: Internal Medicine | Admitting: Internal Medicine

## 2018-09-10 DIAGNOSIS — Z1231 Encounter for screening mammogram for malignant neoplasm of breast: Secondary | ICD-10-CM

## 2018-09-11 DIAGNOSIS — H2513 Age-related nuclear cataract, bilateral: Secondary | ICD-10-CM | POA: Diagnosis not present

## 2018-09-11 DIAGNOSIS — H04123 Dry eye syndrome of bilateral lacrimal glands: Secondary | ICD-10-CM | POA: Diagnosis not present

## 2018-09-11 DIAGNOSIS — H401132 Primary open-angle glaucoma, bilateral, moderate stage: Secondary | ICD-10-CM | POA: Diagnosis not present

## 2018-09-11 DIAGNOSIS — H25013 Cortical age-related cataract, bilateral: Secondary | ICD-10-CM | POA: Diagnosis not present

## 2018-09-20 ENCOUNTER — Ambulatory Visit: Payer: Self-pay | Admitting: Internal Medicine

## 2018-09-20 ENCOUNTER — Encounter: Payer: Self-pay | Admitting: Internal Medicine

## 2018-09-20 NOTE — Progress Notes (Signed)
     R  E  S  C  H  E D  U  L  E  D    A T         A  P  P '  T        T  I  M  E                                                                                                                                                                                                                                      This very nice 78 y.o.female presents for 3 month follow up with HTN, HLD, Pre-Diabetes and Vitamin D Deficiency.       Patient is treated for HTN & BP has been controlled at home. Today's  . Patient has had no complaints of any cardiac type chest pain, palpitations, dyspnea / orthopnea / PND, dizziness, claudication, or dependent edema.      Hyperlipidemia is controlled with diet & meds. Patient denies myalgias or other med SE's. Last Lipids were  Lab Results  Component Value Date   CHOL 192 06/20/2018   HDL 73 06/20/2018   LDLCALC 94 06/20/2018   TRIG 149 06/20/2018   CHOLHDL 2.6 06/20/2018    Also, the patient has history of T2_NIDDM PreDiabetes and has had no symptoms of reactive hypoglycemia, diabetic polys, paresthesias or visual blurring.  Last A1c was   Lab Results  Component Value Date   HGBA1C 6.4 (H) 06/20/2018       Further, the patient also has history of Vitamin D Deficiency and supplements vitamin D without any suspected side-effects. Last vitamin D was Lab Results  Component Value Date   VD25OH 67 03/06/2018

## 2018-09-24 ENCOUNTER — Other Ambulatory Visit: Payer: Self-pay

## 2018-09-24 ENCOUNTER — Encounter: Payer: Self-pay | Admitting: Adult Health

## 2018-09-24 ENCOUNTER — Ambulatory Visit (INDEPENDENT_AMBULATORY_CARE_PROVIDER_SITE_OTHER): Payer: Medicare HMO | Admitting: Adult Health

## 2018-09-24 VITALS — BP 106/60 | HR 48 | Temp 97.0°F | Ht 67.0 in | Wt 225.0 lb

## 2018-09-24 DIAGNOSIS — E782 Mixed hyperlipidemia: Secondary | ICD-10-CM

## 2018-09-24 DIAGNOSIS — Z79899 Other long term (current) drug therapy: Secondary | ICD-10-CM

## 2018-09-24 DIAGNOSIS — E039 Hypothyroidism, unspecified: Secondary | ICD-10-CM | POA: Diagnosis not present

## 2018-09-24 DIAGNOSIS — R7309 Other abnormal glucose: Secondary | ICD-10-CM | POA: Diagnosis not present

## 2018-09-24 DIAGNOSIS — E559 Vitamin D deficiency, unspecified: Secondary | ICD-10-CM

## 2018-09-24 DIAGNOSIS — E669 Obesity, unspecified: Secondary | ICD-10-CM | POA: Diagnosis not present

## 2018-09-24 DIAGNOSIS — I10 Essential (primary) hypertension: Secondary | ICD-10-CM

## 2018-09-24 DIAGNOSIS — N182 Chronic kidney disease, stage 2 (mild): Secondary | ICD-10-CM | POA: Diagnosis not present

## 2018-09-24 MED ORDER — ALPRAZOLAM 1 MG PO TABS: ORAL_TABLET | ORAL | 0 refills | Status: DC

## 2018-09-24 NOTE — Patient Instructions (Signed)
Goals    . Exercise 3x per week     . HEMOGLOBIN A1C < 5.7    . Weight (lb) < 210 lb (95.3 kg)       Aim for 7+ servings of fruits and vegetables daily  65-80+ fluid ounces of water or unsweet tea for healthy kidneys  Limit to max 1 drink of alcohol per day; avoid smoking/tobacco  Limit animal fats in diet for cholesterol and heart health - choose grass fed whenever available  Avoid highly processed foods, and foods high in saturated/trans fats  Aim for low stress - take time to unwind and care for your mental health  Aim for 150 min of moderate intensity exercise weekly for heart health, and weights twice weekly for bone health  Aim for 7-9 hours of sleep daily     Drink 1/2 your body weight in fluid ounces of water daily; drink a tall glass of water 30 min before meals  Don't eat until you're stuffed- listen to your stomach and eat until you are 80% full   Try eating off of a salad plate; wait 10 min after finishing before going back for seconds  Start by eating the vegetables on your plate; aim for 50% of your meals to be fruits or vegetables  Then eat your protein - lean meats (grass fed if possible), fish, beans, nuts in moderation  Eat your carbs/starch last ONLY if you still are hungry. If you can, stop before finishing it all  Avoid sugar and flour - the closer it looks to it's original form in nature, typically the better it is for you  Splurge in moderation - "assign" days when you get to splurge and have the "bad stuff" - I like to follow a 80% - 20% plan- "good" choices 80 % of the time, "bad" choices in moderation 20% of the time  Simple equation is: Calories out > calories in = weight loss - even if you eat the bad stuff, if you limit portions, you will still lose weight    Preventing Type 2 Diabetes Mellitus Type 2 diabetes (type 2 diabetes mellitus) is a long-term (chronic) disease that affects blood sugar (glucose) levels. Normally, a hormone called  insulin allows glucose to enter cells in the body. The cells use glucose for energy. In type 2 diabetes, one or both of these problems may be present:  The body does not make enough insulin.  The body does not respond properly to insulin that it makes (insulin resistance). Insulin resistance or lack of insulin causes excess glucose to build up in the blood instead of going into cells. As a result, high blood glucose (hyperglycemia) develops, which can cause many complications. Being overweight or obese and having an inactive (sedentary) lifestyle can increase your risk for diabetes. Type 2 diabetes can be delayed or prevented by making certain nutrition and lifestyle changes. What nutrition changes can be made?   Eat healthy meals and snacks regularly. Keep a healthy snack with you for when you get hungry between meals, such as fruit or a handful of nuts.  Eat lean meats and proteins that are low in saturated fats, such as chicken, fish, egg whites, and beans. Avoid processed meats.  Eat plenty of fruits and vegetables and plenty of grains that have not been processed (whole grains). It is recommended that you eat: ? 1?2 cups of fruit every day. ? 2?3 cups of vegetables every day. ? 6?8 oz of whole grains every day,  such as oats, whole wheat, bulgur, brown rice, quinoa, and millet.  Eat low-fat dairy products, such as milk, yogurt, and cheese.  Eat foods that contain healthy fats, such as nuts, avocado, olive oil, and canola oil.  Drink water throughout the day. Avoid drinks that contain added sugar, such as soda or sweet tea.  Follow instructions from your health care provider about specific eating or drinking restrictions.  Control how much food you eat at a time (portion size). ? Check food labels to find out the serving sizes of foods. ? Use a kitchen scale to weigh amounts of foods.  Saute or steam food instead of frying it. Cook with water or broth instead of oils or butter.   Limit your intake of: ? Salt (sodium). Have no more than 1 tsp (2,400 mg) of sodium a day. If you have heart disease or high blood pressure, have less than ? tsp (1,500 mg) of sodium a day. ? Saturated fat. This is fat that is solid at room temperature, such as butter or fat on meat. What lifestyle changes can be made? Activity   Do moderate-intensity physical activity for at least 30 minutes on at least 5 days of the week, or as much as told by your health care provider.  Ask your health care provider what activities are safe for you. A mix of physical activities may be best, such as walking, swimming, cycling, and strength training.  Try to add physical activity into your day. For example: ? Park in spots that are farther away than usual, so that you walk more. For example, park in a far corner of the parking lot when you go to the office or the grocery store. ? Take a walk during your lunch break. ? Use stairs instead of elevators or escalators. Weight Loss  Lose weight as directed. Your health care provider can determine how much weight loss is best for you and can help you lose weight safely.  If you are overweight or obese, you may be instructed to lose at least 5?7 % of your body weight. Alcohol and Tobacco   Limit alcohol intake to no more than 1 drink a day for nonpregnant women and 2 drinks a day for men. One drink equals 12 oz of beer, 5 oz of wine, or 1 oz of hard liquor.  Do not use any tobacco products, such as cigarettes, chewing tobacco, and e-cigarettes. If you need help quitting, ask your health care provider. Work With [facility] Provider  Have your blood glucose tested regularly, as told by your health care provider.  Discuss your risk factors and how you can reduce your risk for diabetes.  Get screening tests as told by your health care provider. You may have screening tests regularly, especially if you have certain risk factors for type 2 diabetes.   Make an appointment with a diet and nutrition specialist (registered dietitian). A registered dietitian can help you make a healthy eating plan and can help you understand portion sizes and food labels. Why are these changes important?  It is possible to prevent or delay type 2 diabetes and related health problems by making lifestyle and nutrition changes.  It can be difficult to recognize signs of type 2 diabetes. The best way to avoid possible damage to your body is to take actions to prevent the disease before you develop symptoms. What can happen if changes are not made?  Your blood glucose levels may keep increasing. Having high  blood glucose for a long time is dangerous. Too much glucose in your blood can damage your blood vessels, heart, kidneys, nerves, and eyes.  You may develop prediabetes or type 2 diabetes. Type 2 diabetes can lead to many chronic health problems and complications, such as: ? Heart disease. ? Stroke. ? Blindness. ? Kidney disease. ? Depression. ? Poor circulation in the feet and legs, which could lead to surgical removal (amputation) in severe cases. Where to find support  Ask your health care provider to recommend a registered dietitian, diabetes educator, or weight loss program.  Look for local or online weight loss groups.  Join a gym, fitness club, or outdoor activity group, such as a walking club. Where to find more information To learn more about diabetes and diabetes prevention, visit:  American Diabetes Association (ADA): www.diabetes.[employer] of Diabetes and Digestive and Kidney Diseases: FindSpin.nl To learn more about healthy eating, visit:  The U.S. Department of Agriculture Scientist, research (physical sciences)), Choose My Plate: http://wiley-williams.com/  Office of Disease Prevention and Health Promotion (ODPHP), Dietary Guidelines: SurferLive.at Summary  You can reduce your risk for type 2  diabetes by increasing your physical activity, eating healthy foods, and losing weight as directed.  Talk with your health care provider about your risk for type 2 diabetes. Ask about any blood tests or screening tests that you need to have. This information is not intended to replace advice given to you by your health care provider. Make sure you discuss any questions you have with your health care provider. Document Released: 06/08/2015 Document Revised: 06/08/2018 Document Reviewed: 04/07/2015 Elsevier Patient Education  2020 [employer].

## 2018-09-24 NOTE — Progress Notes (Signed)
FOLLOW UP  Assessment and Plan:   Hypertension Well controlled with current medications  Monitor blood pressure at home; patient to call if consistently greater than 130/80 Continue DASH diet.   Reminder to go to the ER if any CP, SOB, nausea, dizziness, severe HA, changes vision/speech, left arm numbness and tingling and jaw pain.  Cholesterol Currently at goal; continue with lifestyle and red rice yeast supplement Continue low cholesterol diet and exercise.  Check lipid panel.   Lifestyle controlled T2DM with CKD 2 Discussed risks of elevated glucose - recent A1Cs in prediabetic range by lifestyle modification Continue diet and exercise.  Has had eye exam; report requested today  Perform daily foot/skin check, notify office of any concerning changes.  Check A1C  Obesity with co morbidities Long discussion about weight loss, diet, and exercise Recommended diet heavy in fruits and veggies and low in animal meats, cheeses, and dairy products, appropriate calorie intake Discussed ideal weight for height - next visit goal 215 lb Will follow up in 3 months  Vitamin D Def At goal at last visit; continue supplementation to maintain goal of 70-100 Defer Vit D level  Insomnia - good sleep hygiene discussed, increase day time activity, take gabapentin at night and helps reduce use of xanax, limit to <5 days/week    Continue diet and meds as discussed. Further disposition pending results of labs. Discussed med's effects and SE's.   Over 30 minutes of exam, counseling, chart review, and critical decision making was performed.   Future Appointments  Date Time Provider [place]  03/25/2019  2:00 PM Unk Pinto, MD [facility] None  07/02/2019  2:00 PM Liane Comber, NP [facility] None    ----------------------------------------------------------------------------------------------------------------------  HPI 78 y.o. female  presents for 3 month follow up on  hypertension, cholesterol, T2 diabetes controlled by lifestyle, CKD 2, obesity and vitamin D deficiency.   She has R knee arthritis, needs TKA but had to postpone due to covid 19. She has instability but pain is mild and manageable.   She is currently prescribed xanax 1 mg daily for sleep; She reports she uses xanax 1-2 times a week for sleep if she has had several nights in a row with difficulty falling asleep.   BMI is Body mass index is 35.24 kg/m., she has not been working on diet and exercise, but is trying to practice moderation and limit white starches.  Wt Readings from Last 3 Encounters:  09/24/18 225 lb (102.1 kg)  07/19/18 224 lb 6.4 oz (101.8 kg)  06/20/18 222 lb 9.6 oz (101 kg)   Her blood pressure has been controlled at home, today their BP is BP: 106/60  She does not workout. She denies chest pain, shortness of breath, dizziness.   She is not on cholesterol medication (on red rice yeast supplement) and denies myalgias. Her cholesterol is at goal. The cholesterol last visit was:   Lab Results  Component Value Date   CHOL 192 06/20/2018   HDL 73 06/20/2018   LDLCALC 94 06/20/2018   TRIG 149 06/20/2018   CHOLHDL 2.6 06/20/2018    She has not been working on diet and exercise for prediabetes, and denies increased appetite, nausea,  polydipsia, polyuria, visual disturbances, vomiting and weight loss. She does have neuropathy of bilateral feet notably at night, takes gabapentin and topical OTC creams for this and reports is managed well at this time. Last A1C in the office was:  Lab Results  Component Value Date   HGBA1C 6.4 (H)  06/20/2018   Patient is on Vitamin D supplement and at goal at last check:    Lab Results  Component Value Date   VD25OH 67 03/06/2018        Current Medications:  Current Outpatient Medications on File Prior to Visit  Medication Sig  . ACCU-CHEK AVIVA PLUS test strip   . ALPRAZolam (XANAX) 1 MG tablet Take 1/2-1 tablet at Bedtime  ONLY if  needed for Sleep &  limit to 5 days /week to avoid addiction  . aspirin 81 MG tablet Take 81 mg by mouth 2 (two) times a day.   . ASSURE COMFORT LANCETS 30G MISC   . bisoprolol-hydrochlorothiazide (ZIAC) 10-6.25 MG tablet Take 1 tablet Daily for BP  . Cholecalciferol (VITAMIN D-3) 5000 UNITS TABS Take 1 tablet by mouth daily.  Marland Kitchen estradiol (ESTRACE) 0.5 MG tablet Take 1/2 tablet and begin taper to 1/2 tablet every other day (Patient taking differently: Take one tablet daily)  . furosemide (LASIX) 40 MG tablet Take 1 tablet Daily for Fluid  . gabapentin (NEURONTIN) 300 MG capsule Take 1 cap twice a day and then 2 caps at night PRN neuropathy pains.  Elmore Guise Devices (ADJUSTABLE LANCING DEVICE) MISC   . latanoprost (XALATAN) 0.005 % ophthalmic solution Place 1 drop into both eyes daily.   Marland Kitchen levothyroxine (SYNTHROID) 100 MCG tablet TAKE 1 TABLET EVERY DAY  BEFORE  BREAKFAST  . Magnesium 500 MG CAPS Take 2 capsules by mouth daily.   . Red Yeast Rice 600 MG CAPS Take 2 capsules by mouth daily.   . timolol (TIMOPTIC) 0.5 % ophthalmic solution Place 1 drop into both eyes 2 (two) times daily.   . Blood Glucose Calibration (ACCU-CHEK AVIVA) SOLN   . Omega-3 Fatty Acids (OMEGA 3 500 PO) Take 1 capsule by mouth daily.   No current facility-administered medications on file prior to visit.      Allergies:  Allergies  Allergen Reactions  . Meloxicam Swelling    Weight gain     Medical History:  Past Medical History:  Diagnosis Date  . Anal fissure   . Arthritis   . Colon polyps    hyperplastic  . Diverticulosis   . GERD (gastroesophageal reflux disease)   . Glaucoma   . Hemorrhoids   . Hyperlipidemia   . Hypertension   . Hypothyroidism   . Prediabetes    Family history- Reviewed and unchanged Social history- Reviewed and unchanged   Review of Systems:  Review of Systems  Constitutional: Negative for malaise/fatigue and weight loss.  HENT: Negative for hearing loss and tinnitus.    Eyes: Negative for blurred vision and double vision.  Respiratory: Negative for cough, shortness of breath and wheezing.   Cardiovascular: Negative for chest pain, palpitations, orthopnea, claudication and leg swelling.  Gastrointestinal: Negative for abdominal pain, blood in stool, constipation, diarrhea, heartburn, melena, nausea and vomiting.  Genitourinary: Negative.   Musculoskeletal: Negative for joint pain (Right knee, needs TKA) and myalgias.  Skin: Negative for rash.  Neurological: Negative for dizziness, tingling, sensory change, weakness and headaches.  Endo/Heme/Allergies: Negative for polydipsia.  Psychiatric/Behavioral: Negative.   All other systems reviewed and are negative.   Physical Exam: BP 106/60   Pulse (!) 48   Temp (!) 97 F (36.1 C)   Ht 5\' 7"  (1.702 m)   Wt 225 lb (102.1 kg)   SpO2 97%   BMI 35.24 kg/m  Wt Readings from Last 3 Encounters:  09/24/18 225 lb (102.1 kg)  07/19/18 224 lb 6.4 oz (101.8 kg)  06/20/18 222 lb 9.6 oz (101 kg)   General Appearance: Well nourished, in no apparent distress. Eyes: PERRLA, EOMs, conjunctiva no swelling or erythema Sinuses: No Frontal/maxillary tenderness ENT/Mouth: Ext aud canals clear, TMs without erythema, bulging. No erythema, swelling, or exudate on post pharynx.  Tonsils not swollen or erythematous. Hearing normal.  Neck: Supple, thyroid normal.  Respiratory: Respiratory effort normal, BS equal bilaterally without rales, rhonchi, wheezing or stridor.  Cardio: RRR with no MRGs. Brisk peripheral pulses without edema.  Abdomen: Soft, + BS.  Non tender, no guarding, rebound, hernias, masses. Lymphatics: Non tender without lymphadenopathy.  Musculoskeletal: Full ROM, 5/5 strength, normal gait, slow sitting to standing  Skin: Warm, dry without rashes, lesions, ecchymosis.  Neuro: Cranial nerves intact. No cerebellar symptoms.  Psych: Awake and oriented X 3, normal affect, Insight and Judgment appropriate.     Izora Ribas, NP 4:07 PM [facility]

## 2018-09-25 LAB — LIPID PANEL
Cholesterol: 201 mg/dL — ABNORMAL HIGH (ref <200)
HDL: 74 mg/dL (ref >=50)
LDL Cholesterol (Calc): 106 mg/dL (calc) — ABNORMAL HIGH
Non-HDL Cholesterol (Calc): 127 mg/dL (calc) (ref <130)
Total CHOL/HDL Ratio: 2.7 (calc) (ref <5.0)
Triglycerides: 117 mg/dL (ref <150)

## 2018-09-25 LAB — COMPLETE METABOLIC PANEL WITH GFR
AG Ratio: 1.6 (calc) (ref 1.0–2.5)
ALT: 12 U/L (ref 6–29)
AST: 13 U/L (ref 10–35)
Albumin: 4.4 g/dL (ref 3.6–5.1)
Alkaline phosphatase (APISO): 46 U/L (ref 37–153)
BUN/Creatinine Ratio: 15 (calc) (ref 6–22)
BUN: 14 mg/dL (ref 7–25)
CO2: 28 mmol/L (ref 20–32)
Calcium: 10.4 mg/dL (ref 8.6–10.4)
Chloride: 102 mmol/L (ref 98–110)
Creat: 0.96 mg/dL — ABNORMAL HIGH (ref 0.60–0.93)
GFR, Est African American: 66 mL/min/{1.73_m2} (ref >=60)
GFR, Est Non African American: 57 mL/min/{1.73_m2} — ABNORMAL LOW (ref >=60)
Globulin: 2.8 g/dL (calc) (ref 1.9–3.7)
Glucose, Bld: 90 mg/dL (ref 65–99)
Potassium: 4 mmol/L (ref 3.5–5.3)
Sodium: 143 mmol/L (ref 135–146)
Total Bilirubin: 0.8 mg/dL (ref 0.2–1.2)
Total Protein: 7.2 g/dL (ref 6.1–8.1)

## 2018-09-25 LAB — CBC WITH DIFFERENTIAL/PLATELET
Absolute Monocytes: 622 cells/uL (ref 200–950)
Basophils Absolute: 49 cells/uL (ref 0–200)
Basophils Relative: 1 %
Eosinophils Absolute: 162 cells/uL (ref 15–500)
Eosinophils Relative: 3.3 %
HCT: 42.2 % (ref 35.0–45.0)
Hemoglobin: 14.1 g/dL (ref 11.7–15.5)
Lymphs Abs: 2377 cells/uL (ref 850–3900)
MCH: 27.6 pg (ref 27.0–33.0)
MCHC: 33.4 g/dL (ref 32.0–36.0)
MCV: 82.6 fL (ref 80.0–100.0)
MPV: 9.6 fL (ref 7.5–12.5)
Monocytes Relative: 12.7 %
Neutro Abs: 1691 cells/uL (ref 1500–7800)
Neutrophils Relative %: 34.5 %
Platelets: 259 10*3/uL (ref 140–400)
RBC: 5.11 10*6/uL — ABNORMAL HIGH (ref 3.80–5.10)
RDW: 13.3 % (ref 11.0–15.0)
Total Lymphocyte: 48.5 %
WBC: 4.9 10*3/uL (ref 3.8–10.8)

## 2018-09-25 LAB — TSH: TSH: 2.06 mIU/L (ref 0.40–4.50)

## 2018-09-25 LAB — MAGNESIUM: Magnesium: 2.1 mg/dL (ref 1.5–2.5)

## 2018-10-04 ENCOUNTER — Other Ambulatory Visit: Payer: Self-pay | Admitting: Internal Medicine

## 2018-10-29 ENCOUNTER — Other Ambulatory Visit: Payer: Self-pay | Admitting: Internal Medicine

## 2018-10-29 ENCOUNTER — Telehealth: Payer: Self-pay

## 2018-10-29 MED ORDER — GABAPENTIN 300 MG PO CAPS: ORAL_CAPSULE | ORAL | 1 refills | Status: DC

## 2018-10-29 NOTE — Telephone Encounter (Signed)
Spoke with Dr. Melford Aase and he says that she was supposed to be tapering to 1/2 tablet every other day. He would prescribed her Gabapentin for the hot flashes. Patient advised.

## 2018-10-29 NOTE — Telephone Encounter (Signed)
Estradiol prescription only written for 23 tablets. Patient states that she takes 1 tablet daily. Last rx written on 10/04/18, take 1/2 tablet every other day. Please advise.

## 2018-12-14 ENCOUNTER — Other Ambulatory Visit: Payer: Self-pay | Admitting: Internal Medicine

## 2018-12-18 ENCOUNTER — Other Ambulatory Visit: Payer: Self-pay | Admitting: Internal Medicine

## 2018-12-20 ENCOUNTER — Encounter: Payer: Self-pay | Admitting: Adult Health

## 2018-12-20 DIAGNOSIS — I6529 Occlusion and stenosis of unspecified carotid artery: Secondary | ICD-10-CM

## 2018-12-20 HISTORY — DX: Occlusion and stenosis of unspecified carotid artery: I65.29

## 2018-12-20 NOTE — Progress Notes (Deleted)
FOLLOW UP  Assessment and Plan:   Hypertension Well controlled with current medications  Monitor blood pressure at home; patient to call if consistently greater than 130/80 Continue DASH diet.   Reminder to go to the ER if any CP, SOB, nausea, dizziness, severe HA, changes vision/speech, left arm numbness and tingling and jaw pain.  Hyperlipidemia associated with T2DM ([place]) continue with lifestyle and red rice yeast supplement *** LDL goal <70 due to T2DM *** Continue low cholesterol diet and exercise.  Check lipid panel.   Lifestyle controlled T2DM with CKD 2 Discussed risks of elevated glucose - recent A1Cs in prediabetic range by lifestyle modification Continue diet and exercise.  Has had eye exam; report requested today  Perform daily foot/skin check, notify office of any concerning changes.  Check A1C  CKD II associated with T2DM (HCC) Increase fluids, avoid NSAIDS, monitor sugars, will monitor CMP/GFR  Diabetic peripheral neuropathy (HCC) Continue PRN gabapentin and topicals, discussed feet checks.   Obesity with co morbidities (htn, hyperlipidemia, T2DM, arthritis) Long discussion about weight loss, diet, and exercise Recommended diet heavy in fruits and veggies and low in animal meats, cheeses, and dairy products, appropriate calorie intake Discussed ideal weight for height - next visit goal 215 lb Will follow up in 3 months  Vitamin D Def At goal at last visit; continue supplementation to maintain goal of 70-100 Defer Vit D level  Insomnia - good sleep hygiene discussed, increase day time activity, take gabapentin at night and helps reduce use of xanax, limit to <5 days/week    Continue diet and meds as discussed. Further disposition pending results of labs. Discussed med's effects and SE's.   Over 30 minutes of exam, counseling, chart review, and critical decision making was performed.   Future Appointments  Date Time Provider [place]  12/26/2018   2:30 PM Liane Comber, NP [facility] None  03/28/2019  2:00 PM Unk Pinto, MD [facility] None  07/02/2019  2:00 PM Liane Comber, NP [facility] None    ----------------------------------------------------------------------------------------------------------------------  HPI 78 y.o. female  presents for 3 month follow up on hypertension, cholesterol, T2 diabetes controlled by lifestyle, neuropathy, CKD 2, hypothyroid, obesity with comorbidities and vitamin D deficiency.   She has R knee arthritis, needs TKA but had to postpone due to covid 19. She has instability but pain is mild and manageable.   She is currently prescribed xanax 1 mg daily for sleep; She reports she uses xanax 1-2 times a week for sleep if she has had several nights in a row with difficulty falling asleep.   BMI is There is no height or weight on file to calculate BMI., she has not been working on diet and exercise, but is trying to practice moderation and limit white starches.  Wt Readings from Last 3 Encounters:  09/24/18 225 lb (102.1 kg)  07/19/18 224 lb 6.4 oz (101.8 kg)  06/20/18 222 lb 9.6 oz (101 kg)   She had vascular US carotids in 2017 for concern of L retinal vein occlusion by ophth which showed 1-39% bilateral ICA stenosis, patent vertebral arteries with antegrade flow. Her blood pressure has been controlled at home, today their BP is    She does not workout. She denies chest pain, shortness of breath, dizziness.   She is not on cholesterol medication (on red rice yeast supplement ***) and denies myalgias. Her cholesterol is not at goal. The cholesterol last visit was:   Lab Results  Component Value Date   CHOL 201 (H)  09/24/2018   HDL 74 09/24/2018   LDLCALC 106 (H) 09/24/2018   TRIG 117 09/24/2018   CHOLHDL 2.7 09/24/2018    She has not been working on diet and exercise for diet controlled T2DM (6.7% in 12/2014, 6.5% in 04/2015), and denies increased appetite, nausea,  polydipsia, polyuria,  visual disturbances, vomiting and weight loss. She does have neuropathy of bilateral feet notably at night, takes gabapentin and topical OTC creams for this and reports is managed well at this time.  Last A1C in the office was:  Lab Results  Component Value Date   HGBA1C 6.4 (H) 06/20/2018   She has CKD II monitored via this office:  Lab Results  Component Value Date   GFRAA 66 09/24/2018   She is on thyroid medication. Her medication was not changed last visit.   Lab Results  Component Value Date   TSH 2.06 09/24/2018   Patient is on Vitamin D supplement and at goal at last check:    Lab Results  Component Value Date   VD25OH 67 03/06/2018        Current Medications:  Current Outpatient Medications on File Prior to Visit  Medication Sig  . ACCU-CHEK AVIVA PLUS test strip   . ALPRAZolam (XANAX) 1 MG tablet Take 1/2-1 tablet twice a day ONLY if needed for sleep or anxiety & limit to 5 days /week to avoid addiction  . aspirin 81 MG tablet Take 81 mg by mouth 2 (two) times a day.   . ASSURE COMFORT LANCETS 30G MISC   . bisoprolol-hydrochlorothiazide (ZIAC) 10-6.25 MG tablet Take 1 tablet Daily for BP  . Blood Glucose Calibration (ACCU-CHEK AVIVA) SOLN   . Cholecalciferol (VITAMIN D-3) 5000 UNITS TABS Take 1 tablet by mouth daily.  Marland Kitchen estradiol (ESTRACE) 0.5 MG tablet Take 1/2 tablet Every Other Day  . furosemide (LASIX) 40 MG tablet Take 1 tablet Daily for Fluid Retention / Ankle Swelling  . gabapentin (NEURONTIN) 300 MG capsule Take 1 cap 2 x  during the day and then 2 caps at night for Hot Flashes & Neuropathy  Pains.  Elmore Guise Devices (ADJUSTABLE LANCING DEVICE) MISC   . latanoprost (XALATAN) 0.005 % ophthalmic solution Place 1 drop into both eyes daily.   Marland Kitchen levothyroxine (SYNTHROID) 100 MCG tablet TAKE 1 TABLET EVERY DAY  BEFORE  BREAKFAST  . Magnesium 500 MG CAPS Take 2 capsules by mouth daily.   . Omega-3 Fatty Acids (OMEGA 3 500 PO) Take 1 capsule by mouth daily.  . Red  Yeast Rice 600 MG CAPS Take 2 capsules by mouth daily.   . timolol (TIMOPTIC) 0.5 % ophthalmic solution Place 1 drop into both eyes 2 (two) times daily.    No current facility-administered medications on file prior to visit.      Allergies:  Allergies  Allergen Reactions  . Meloxicam Swelling    Weight gain     Medical History:  Past Medical History:  Diagnosis Date  . Anal fissure   . Arthritis   . Carotid stenosis, <50%, bilateral  12/20/2018  . Colon polyps    hyperplastic  . Diverticulosis   . GERD (gastroesophageal reflux disease)   . Glaucoma   . Hemorrhoids   . Hyperlipidemia   . Hypertension   . Hypothyroidism   . Prediabetes    Family history- Reviewed and unchanged Social history- Reviewed and unchanged   Review of Systems:  Review of Systems  Constitutional: Negative for malaise/fatigue and weight loss.  HENT: Negative  for hearing loss and tinnitus.   Eyes: Negative for blurred vision and double vision.  Respiratory: Negative for cough, shortness of breath and wheezing.   Cardiovascular: Negative for chest pain, palpitations, orthopnea, claudication and leg swelling.  Gastrointestinal: Negative for abdominal pain, blood in stool, constipation, diarrhea, heartburn, melena, nausea and vomiting.  Genitourinary: Negative.   Musculoskeletal: Negative for joint pain (Right knee, needs TKA) and myalgias.  Skin: Negative for rash.  Neurological: Negative for dizziness, tingling, sensory change, weakness and headaches.  Endo/Heme/Allergies: Negative for polydipsia.  Psychiatric/Behavioral: Negative.   All other systems reviewed and are negative.   Physical Exam: There were no vitals taken for this visit. Wt Readings from Last 3 Encounters:  09/24/18 225 lb (102.1 kg)  07/19/18 224 lb 6.4 oz (101.8 kg)  06/20/18 222 lb 9.6 oz (101 kg)   General Appearance: Well nourished, in no apparent distress. Eyes: PERRLA, EOMs, conjunctiva no swelling or  erythema Sinuses: No Frontal/maxillary tenderness ENT/Mouth: Ext aud canals clear, TMs without erythema, bulging. No erythema, swelling, or exudate on post pharynx.  Tonsils not swollen or erythematous. Hearing normal.  Neck: Supple, thyroid normal.  Respiratory: Respiratory effort normal, BS equal bilaterally without rales, rhonchi, wheezing or stridor.  Cardio: RRR with no MRGs. Brisk peripheral pulses without edema.  Abdomen: Soft, + BS.  Non tender, no guarding, rebound, hernias, masses. Lymphatics: Non tender without lymphadenopathy.  Musculoskeletal: Full ROM, 5/5 strength, normal gait, slow sitting to standing  Skin: Warm, dry without rashes, lesions, ecchymosis.  Neuro: Cranial nerves intact. No cerebellar symptoms.  Psych: Awake and oriented X 3, normal affect, Insight and Judgment appropriate.    Izora Ribas, NP 2:57 PM [facility]

## 2018-12-26 ENCOUNTER — Ambulatory Visit: Payer: Medicare HMO | Admitting: Adult Health

## 2018-12-26 DIAGNOSIS — I10 Essential (primary) hypertension: Secondary | ICD-10-CM

## 2018-12-27 NOTE — Progress Notes (Signed)
FOLLOW UP  Assessment and Plan:   Hypertension Well controlled with current medications  Monitor blood pressure at home; patient to call if consistently greater than 130/80 Continue DASH diet.   Reminder to go to the ER if any CP, SOB, nausea, dizziness, severe HA, changes vision/speech, left arm numbness and tingling and jaw pain.  Hyperlipidemia associated with T2DM ([place]) After discussion of statin benefits and current guidelines she is agreeable to initiating low dose to trial, 5 mg rosuvastatin sent in to initiate 3 nights/week  LDL goal <70 due to T2DM  Continue low cholesterol diet and exercise.  Check lipid panel.   Lifestyle controlled T2DM with CKD 2 Discussed risks of elevated glucose - recent A1Cs in prediabetic range by lifestyle modification Continue diet and exercise.  Has had eye exam; report requested today  Perform daily foot/skin check, notify office of any concerning changes.  Check A1C  CKD II associated with T2DM (HCC) Increase fluids, avoid NSAIDS, monitor sugars, will monitor CMP/GFR  Diabetic peripheral neuropathy (HCC) Continue PRN gabapentin and topicals, discussed feet checks.   Obesity with co morbidities (htn, hyperlipidemia, T2DM, arthritis) Long discussion about weight loss, diet, and exercise Recommended diet heavy in fruits and veggies and low in animal meats, cheeses, and dairy products, appropriate calorie intake Discussed ideal weight for height - next visit goal 215 lb Will follow up in 3 months  Vitamin D Def At goal at last visit; continue supplementation to maintain goal of 70-100 Defer Vit D level  Insomnia - good sleep hygiene discussed, increase day time activity, take gabapentin at night and helps reduce use of xanax, limit to <5 days/week    Estrogen deficiency/vasomotor symptoms She is off of estrogen; she does have some vasomotor sx; continue gabapentin which is beneficial  Weight loss and other strategies  discussed Suggested black cohosh supplement    Continue diet and meds as discussed. Further disposition pending results of labs. Discussed med's effects and SE's.   Over 30 minutes of exam, counseling, chart review, and critical decision making was performed.   Future Appointments  Date Time Provider [place]  03/28/2019  2:00 PM Unk Pinto, MD [facility] None  07/02/2019  2:00 PM Liane Comber, NP [facility] None    ----------------------------------------------------------------------------------------------------------------------  HPI 78 y.o. female  presents for 3 month follow up on hypertension, cholesterol, T2 diabetes controlled by lifestyle, neuropathy, CKD 2, hypothyroid, obesity with comorbidities and vitamin D deficiency.   She has R knee arthritis, needs TKA but had to postpone due to covid 19. She has instability and pain but continues to prefer to postpone.   She is currently prescribed xanax 1 mg daily for sleep; She reports she has decreased to using 1/4 tab xanax <1 day/ week for sleep if she has had several nights in a row with difficulty falling asleep.   She is currently in process of tapering off of estrace, took last a few days. She reports she takes gabapentin 300 mg QID for hot flashes.   BMI is Body mass index is 35.24 kg/m., she has not been working on diet and exercise, but is trying to practice moderation and limit white starches. Weight goal 210 lb.  Wt Readings from Last 3 Encounters:  12/31/18 225 lb (102.1 kg)  09/24/18 225 lb (102.1 kg)  07/19/18 224 lb 6.4 oz (101.8 kg)   She had vascular US carotids in 2017 for concern of L retinal vein occlusion by ophth which showed 1-39% bilateral ICA stenosis, patent vertebral  arteries with antegrade flow. Her blood pressure has been controlled at home, today their BP is BP: 110/60  She does not workout. She denies chest pain, shortness of breath, dizziness.   She is not on cholesterol  medication (on red rice yeast supplement, denies history of statin intolerance) and denies myalgias. Her cholesterol is not at goal. The cholesterol last visit was:   Lab Results  Component Value Date   CHOL 201 (H) 09/24/2018   HDL 74 09/24/2018   LDLCALC 106 (H) 09/24/2018   TRIG 117 09/24/2018   CHOLHDL 2.7 09/24/2018    She has not been working on diet and exercise for diet controlled T2DM (6.7% in 12/2014, 6.5% in 04/2015), and denies increased appetite, nausea,  polydipsia, polyuria, visual disturbances, vomiting and weight loss. She does have neuropathy of bilateral feet notably at night, takes gabapentin and topical OTC creams for this and reports is managed well at this time.  Last A1C in the office was:  Lab Results  Component Value Date   HGBA1C 6.4 (H) 06/20/2018   She has CKD II monitored via this office:  Lab Results  Component Value Date   GFRAA 66 09/24/2018   She is on thyroid medication. Her medication was not changed last visit.   Lab Results  Component Value Date   TSH 2.06 09/24/2018   Patient is on Vitamin D supplement and at goal at last check:    Lab Results  Component Value Date   VD25OH 67 03/06/2018        Current Medications:  Current Outpatient Medications on File Prior to Visit  Medication Sig  . ACCU-CHEK AVIVA PLUS test strip   . ALPRAZolam (XANAX) 1 MG tablet Take 1/2-1 tablet twice a day ONLY if needed for sleep or anxiety & limit to 5 days /week to avoid addiction  . aspirin 81 MG tablet Take 81 mg by mouth daily. Takes two tablets daily  . ASSURE COMFORT LANCETS 30G MISC   . bisoprolol-hydrochlorothiazide (ZIAC) 10-6.25 MG tablet Take 1 tablet Daily for BP  . Blood Glucose Calibration (ACCU-CHEK AVIVA) SOLN   . Cholecalciferol (VITAMIN D-3) 5000 UNITS TABS Take 1 tablet by mouth daily.  . furosemide (LASIX) 40 MG tablet Take 1 tablet Daily for Fluid Retention / Ankle Swelling  . Lancet Devices (ADJUSTABLE LANCING DEVICE) MISC   .  latanoprost (XALATAN) 0.005 % ophthalmic solution Place 1 drop into both eyes daily.   Marland Kitchen levothyroxine (SYNTHROID) 100 MCG tablet TAKE 1 TABLET EVERY DAY  BEFORE  BREAKFAST  . Magnesium 500 MG CAPS Take 2 capsules by mouth daily.   . Red Yeast Rice 600 MG CAPS Take 2 capsules by mouth daily.   . timolol (TIMOPTIC) 0.5 % ophthalmic solution Place 1 drop into both eyes 2 (two) times daily.   . Omega-3 Fatty Acids (OMEGA 3 500 PO) Take 1 capsule by mouth daily.   No current facility-administered medications on file prior to visit.      Allergies:  Allergies  Allergen Reactions  . Meloxicam Swelling    Weight gain     Medical History:  Past Medical History:  Diagnosis Date  . Anal fissure   . Arthritis   . Carotid stenosis, <50%, bilateral  12/20/2018  . Colon polyps    hyperplastic  . Diverticulosis   . GERD (gastroesophageal reflux disease)   . Glaucoma   . Hemorrhoids   . Hyperlipidemia   . Hypertension   . Hypothyroidism   . Prediabetes  Family history- Reviewed and unchanged Social history- Reviewed and unchanged   Review of Systems:  Review of Systems  Constitutional: Positive for diaphoresis (frequent episdes since tapering estrogen). Negative for malaise/fatigue and weight loss.  HENT: Negative for hearing loss and tinnitus.   Eyes: Negative for blurred vision and double vision.  Respiratory: Negative for cough, shortness of breath and wheezing.   Cardiovascular: Negative for chest pain, palpitations, orthopnea, claudication and leg swelling.  Gastrointestinal: Negative for abdominal pain, blood in stool, constipation, diarrhea, heartburn, melena, nausea and vomiting.  Genitourinary: Negative.   Musculoskeletal: Positive for joint pain (Right knee, needs TKA). Negative for myalgias.  Skin: Negative for rash.  Neurological: Negative for dizziness, tingling, sensory change, weakness and headaches.  Endo/Heme/Allergies: Negative for polydipsia.   Psychiatric/Behavioral: Negative for depression and substance abuse. The patient is not nervous/anxious and does not have insomnia.   All other systems reviewed and are negative.   Physical Exam: BP 110/60   Pulse (!) 54   Temp (!) 97.3 F (36.3 C)   Ht 5\' 7"  (1.702 m)   Wt 225 lb (102.1 kg)   SpO2 96%   BMI 35.24 kg/m  Wt Readings from Last 3 Encounters:  12/31/18 225 lb (102.1 kg)  09/24/18 225 lb (102.1 kg)  07/19/18 224 lb 6.4 oz (101.8 kg)   General Appearance: Well nourished, in no apparent distress. Eyes: PERRLA, EOMs, conjunctiva no swelling or erythema Sinuses: No Frontal/maxillary tenderness ENT/Mouth: Ext aud canals clear, TMs without erythema, bulging. No erythema, swelling, or exudate on post pharynx.  Tonsils not swollen or erythematous. Hearing normal.  Neck: Supple, thyroid normal.  Respiratory: Respiratory effort normal, BS equal bilaterally without rales, rhonchi, wheezing or stridor.  Cardio: RRR with no MRGs. Brisk peripheral pulses without edema.  Abdomen: Soft, + BS.  Non tender, no guarding, rebound, hernias, masses. Lymphatics: Non tender without lymphadenopathy.  Musculoskeletal: Full ROM, 5/5 strength, normal gait, slow sitting to standing, mild effusion R knee without redness or heat Skin: Warm, dry without rashes, lesions, ecchymosis.  Neuro: Cranial nerves intact. No cerebellar symptoms.  Psych: Awake and oriented X 3, normal affect, Insight and Judgment appropriate.    Izora Ribas, NP 3:03 PM [facility]

## 2018-12-31 ENCOUNTER — Ambulatory Visit (INDEPENDENT_AMBULATORY_CARE_PROVIDER_SITE_OTHER): Payer: Medicare HMO | Admitting: Adult Health

## 2018-12-31 ENCOUNTER — Other Ambulatory Visit: Payer: Self-pay | Admitting: Internal Medicine

## 2018-12-31 ENCOUNTER — Other Ambulatory Visit: Payer: Self-pay

## 2018-12-31 ENCOUNTER — Encounter: Payer: Self-pay | Admitting: Adult Health

## 2018-12-31 VITALS — BP 110/60 | HR 54 | Temp 97.3°F | Ht 67.0 in | Wt 225.0 lb

## 2018-12-31 DIAGNOSIS — E785 Hyperlipidemia, unspecified: Secondary | ICD-10-CM

## 2018-12-31 DIAGNOSIS — Z79899 Other long term (current) drug therapy: Secondary | ICD-10-CM | POA: Diagnosis not present

## 2018-12-31 DIAGNOSIS — E559 Vitamin D deficiency, unspecified: Secondary | ICD-10-CM

## 2018-12-31 DIAGNOSIS — E1122 Type 2 diabetes mellitus with diabetic chronic kidney disease: Secondary | ICD-10-CM

## 2018-12-31 DIAGNOSIS — G4700 Insomnia, unspecified: Secondary | ICD-10-CM

## 2018-12-31 DIAGNOSIS — E119 Type 2 diabetes mellitus without complications: Secondary | ICD-10-CM | POA: Diagnosis not present

## 2018-12-31 DIAGNOSIS — E1169 Type 2 diabetes mellitus with other specified complication: Secondary | ICD-10-CM

## 2018-12-31 DIAGNOSIS — N951 Menopausal and female climacteric states: Secondary | ICD-10-CM

## 2018-12-31 DIAGNOSIS — N182 Chronic kidney disease, stage 2 (mild): Secondary | ICD-10-CM

## 2018-12-31 DIAGNOSIS — I10 Essential (primary) hypertension: Secondary | ICD-10-CM

## 2018-12-31 DIAGNOSIS — E1142 Type 2 diabetes mellitus with diabetic polyneuropathy: Secondary | ICD-10-CM | POA: Diagnosis not present

## 2018-12-31 DIAGNOSIS — E039 Hypothyroidism, unspecified: Secondary | ICD-10-CM

## 2018-12-31 MED ORDER — ROSUVASTATIN CALCIUM 5 MG PO TABS: ORAL_TABLET | ORAL | 1 refills | Status: DC

## 2018-12-31 NOTE — Patient Instructions (Addendum)
Goals    . Exercise 3x per week     . HEMOGLOBIN A1C < 5.7    . LDL CALC < 70    . Weight (lb) < 210 lb (95.3 kg)      Please pick up a knee brace with more support -tighter strap  Consider a black cohosh supplement - for hot flashes  Starting cholesterol medicine - rosuvastatin 5 mg three days a week - stop red yeast rice when you start this     Rosuvastatin Tablets What is this medicine? ROSUVASTATIN (roe SOO va sta tin) is known as a HMG-CoA reductase inhibitor or 'statin'. It lowers cholesterol and triglycerides in the blood. This drug may also reduce the risk of heart attack, stroke, or other health problems in patients with risk factors for heart disease. Diet and lifestyle changes are often used with this drug. This medicine may be used for other purposes; ask your health care provider or pharmacist if you have questions. COMMON BRAND NAME(S): Crestor What should I tell my health care provider before I take this medicine? They need to know if you have any of these conditions:  diabetes  if you often drink alcohol  history of stroke  kidney disease  liver disease  muscle aches or weakness  thyroid disease  an unusual or allergic reaction to rosuvastatin, other medicines, foods, dyes, or preservatives  pregnant or trying to get pregnant  breast-feeding How should I use this medicine? Take this medicine by mouth with a glass of water. Follow the directions on the prescription label. Do not cut, crush or chew this medicine. You can take this medicine with or without food. Take your doses at regular intervals. Do not take your medicine more often than directed. Talk to your pediatrician regarding the use of this medicine in children. While this drug may be prescribed for children as young as 45 years old for selected conditions, precautions do apply. Overdosage: If you think you have taken too much of this medicine contact a poison control center or emergency room at  once. NOTE: This medicine is only for you. Do not share this medicine with others. What if I miss a dose? If you miss a dose, take it as soon as you can. If your next dose is to be taken in less than 12 hours, then do not take the missed dose. Take the next dose at your regular time. Do not take double or extra doses. What may interact with this medicine? Do not take this medicine with any of the following medications:  herbal medicines like red yeast rice This medicine may also interact with the following medications:  alcohol  antacids containing aluminum hydroxide or magnesium hydroxide  cyclosporine  other medicines for high cholesterol  some medicines for HIV infection  warfarin This list may not describe all possible interactions. Give your health care provider a list of all the medicines, herbs, non-prescription drugs, or dietary supplements you use. Also tell them if you smoke, drink alcohol, or use illegal drugs. Some items may interact with your medicine. What should I watch for while using this medicine? Visit your doctor or health care professional for regular check-ups. You may need regular tests to make sure your liver is working properly. Your health care professional may tell you to stop taking this medicine if you develop muscle problems. If your muscle problems do not go away after stopping this medicine, contact your health care professional. Do not become pregnant while taking  this medicine. Women should inform their health care professional if they wish to become pregnant or think they might be pregnant. There is a potential for serious side effects to an unborn child. Talk to your health care professional or pharmacist for more information. Do not breast-feed an infant while taking this medicine. This medicine may increase blood sugar. Ask your healthcare provider if changes in diet or medicines are needed if you have diabetes. If you are going to need surgery or  other procedure, tell your doctor that you are using this medicine. This drug is only part of a total heart-health program. Your doctor or a dietician can suggest a low-cholesterol and low-fat diet to help. Avoid alcohol and smoking, and keep a proper exercise schedule. This medicine may cause a decrease in Co-Enzyme Q-10. You should make sure that you get enough Co-Enzyme Q-10 while you are taking this medicine. Discuss the foods you eat and the vitamins you take with your health care professional. What side effects may I notice from receiving this medicine? Side effects that you should report to your doctor or health care professional as soon as possible:  allergic reactions like skin rash, itching or hives, swelling of the face, lips, or tongue  confusion  joint pain  loss of memory  redness, blistering, peeling or loosening of the skin, including inside the mouth  signs and symptoms of high blood sugar such as being more thirsty or hungry or having to urinate more than normal. You may also feel very tired or have blurry vision.  signs and symptoms of muscle injury like dark urine; trouble passing urine or change in the amount of urine; unusually weak or tired; muscle pain or side or back pain  yellowing of the eyes or skin Side effects that usually do not require medical attention (report to your doctor or health care professional if they continue or are bothersome):  constipation  diarrhea  dizziness  gas  headache  nausea  stomach pain  trouble sleeping  upset stomach This list may not describe all possible side effects. Call your doctor for medical advice about side effects. You may report side effects to FDA at 1-800-FDA-1088. Where should I keep my medicine? Keep out of the reach of children. Store at room temperature between 20 and 25 degrees C (68 and 77 degrees F). Keep container tightly closed (protect from moisture). Throw away any unused medicine after the  expiration date. NOTE: This sheet is a summary. It may not cover all possible information. If you have questions about this medicine, talk to your doctor, pharmacist, or health care provider.  2020 [employer]vier/Gold Standard (2017-12-07 08:25:08)

## 2019-01-01 ENCOUNTER — Other Ambulatory Visit: Payer: Self-pay | Admitting: Adult Health

## 2019-01-01 LAB — COMPLETE METABOLIC PANEL WITH GFR
AG Ratio: 1.7 (calc) (ref 1.0–2.5)
ALT: 11 U/L (ref 6–29)
AST: 14 U/L (ref 10–35)
Albumin: 4.5 g/dL (ref 3.6–5.1)
Alkaline phosphatase (APISO): 53 U/L (ref 37–153)
BUN/Creatinine Ratio: 20 (calc) (ref 6–22)
BUN: 21 mg/dL (ref 7–25)
CO2: 27 mmol/L (ref 20–32)
Calcium: 10.7 mg/dL — ABNORMAL HIGH (ref 8.6–10.4)
Chloride: 104 mmol/L (ref 98–110)
Creat: 1.04 mg/dL — ABNORMAL HIGH (ref 0.60–0.93)
GFR, Est African American: 60 mL/min/{1.73_m2} (ref >=60)
GFR, Est Non African American: 51 mL/min/{1.73_m2} — ABNORMAL LOW (ref >=60)
Globulin: 2.6 g/dL (calc) (ref 1.9–3.7)
Glucose, Bld: 105 mg/dL — ABNORMAL HIGH (ref 65–99)
Potassium: 3.6 mmol/L (ref 3.5–5.3)
Sodium: 141 mmol/L (ref 135–146)
Total Bilirubin: 0.7 mg/dL (ref 0.2–1.2)
Total Protein: 7.1 g/dL (ref 6.1–8.1)

## 2019-01-01 LAB — LIPID PANEL
Cholesterol: 204 mg/dL — ABNORMAL HIGH (ref <200)
HDL: 65 mg/dL (ref >=50)
LDL Cholesterol (Calc): 109 mg/dL (calc) — ABNORMAL HIGH
Non-HDL Cholesterol (Calc): 139 mg/dL (calc) — ABNORMAL HIGH (ref <130)
Total CHOL/HDL Ratio: 3.1 (calc) (ref <5.0)
Triglycerides: 188 mg/dL — ABNORMAL HIGH (ref <150)

## 2019-01-01 LAB — CBC WITH DIFFERENTIAL/PLATELET
Absolute Monocytes: 460 cells/uL (ref 200–950)
Basophils Absolute: 40 cells/uL (ref 0–200)
Basophils Relative: 1 %
Eosinophils Absolute: 188 cells/uL (ref 15–500)
Eosinophils Relative: 4.7 %
HCT: 42.1 % (ref 35.0–45.0)
Hemoglobin: 14.4 g/dL (ref 11.7–15.5)
Lymphs Abs: 2184 cells/uL (ref 850–3900)
MCH: 27.7 pg (ref 27.0–33.0)
MCHC: 34.2 g/dL (ref 32.0–36.0)
MCV: 81 fL (ref 80.0–100.0)
MPV: 10.1 fL (ref 7.5–12.5)
Monocytes Relative: 11.5 %
Neutro Abs: 1128 cells/uL — ABNORMAL LOW (ref 1500–7800)
Neutrophils Relative %: 28.2 %
Platelets: 252 10*3/uL (ref 140–400)
RBC: 5.2 10*6/uL — ABNORMAL HIGH (ref 3.80–5.10)
RDW: 13.5 % (ref 11.0–15.0)
Total Lymphocyte: 54.6 %
WBC: 4 10*3/uL (ref 3.8–10.8)

## 2019-01-01 LAB — HEMOGLOBIN A1C
Hgb A1c MFr Bld: 6.3 % of total Hgb — ABNORMAL HIGH (ref <5.7)
Mean Plasma Glucose: 134 (calc)
eAG (mmol/L): 7.4 (calc)

## 2019-01-01 LAB — TSH: TSH: 3.47 mIU/L (ref 0.40–4.50)

## 2019-01-01 LAB — MAGNESIUM: Magnesium: 1.9 mg/dL (ref 1.5–2.5)

## 2019-01-04 ENCOUNTER — Other Ambulatory Visit: Payer: Self-pay | Admitting: Internal Medicine

## 2019-01-15 DIAGNOSIS — H401132 Primary open-angle glaucoma, bilateral, moderate stage: Secondary | ICD-10-CM | POA: Diagnosis not present

## 2019-03-25 ENCOUNTER — Other Ambulatory Visit: Payer: Self-pay | Admitting: Internal Medicine

## 2019-03-25 ENCOUNTER — Encounter: Payer: Self-pay | Admitting: Internal Medicine

## 2019-03-25 MED ORDER — ALPRAZOLAM 1 MG PO TABS: ORAL_TABLET | ORAL | 0 refills | Status: DC

## 2019-03-28 ENCOUNTER — Encounter: Payer: Self-pay | Admitting: Internal Medicine

## 2019-03-29 ENCOUNTER — Other Ambulatory Visit: Payer: Self-pay | Admitting: Internal Medicine

## 2019-04-03 ENCOUNTER — Encounter: Payer: Self-pay | Admitting: Internal Medicine

## 2019-04-03 NOTE — Patient Instructions (Signed)
Vit D  & Vit C 1,000 mg   are recommended to help protect  against the Covid-19 and other Corona viruses.    Also it's recommended  to take  Zinc 50 mg  to help  protect against the Covid-19   and best place to get  is also on [email or web address]  and don't pay more than 6-8 cents /pill !  ================================ Coronavirus (COVID-19) Are you at risk?  Are you at risk for the Coronavirus (COVID-19)?  To be considered HIGH RISK for Coronavirus (COVID-19), you have to meet the following criteria:  . Traveled to [place], [place], [place], [place] or [place]; or in the [place] to [place], [place], [place]  . or [place]; and have fever, cough, and shortness of breath within the last 2 weeks of travel OR . Been in close contact with a person diagnosed with COVID-19 within the last 2 weeks and have  . fever, cough,and shortness of breath .  . IF YOU DO NOT MEET THESE CRITERIA, YOU ARE CONSIDERED LOW RISK FOR COVID-19.  What to do if you are HIGH RISK for COVID-19?  Marland Kitchen If you are having a medical emergency, call 911. . Seek medical care right away. Before you go to a doctor's office, urgent care or emergency department, .  call ahead and tell them about your recent travel, contact with someone diagnosed with COVID-19  .  and your symptoms.  . You should receive instructions from your physician's office regarding next steps of care.  . When you arrive at healthcare provider, tell the healthcare staff immediately you have returned from  . visiting [place], [place], [place], [place] or [place]; or traveled in the [place] to [place], [place],  . [place] or [place] in the last two weeks or you have been in close contact with a person diagnosed with  . COVID-19 in the last 2 weeks.   . Tell the health care staff about your symptoms: fever, cough and shortness of breath. . After you have been seen by a medical provider, you will be either: o Tested for (COVID-19) and  discharged home on quarantine except to seek medical care if  o symptoms worsen, and asked to  - Stay home and avoid contact with others until you get your results (4-5 days)  - Avoid travel on public transportation if possible (such as bus, train, or airplane) or o Sent to the Emergency Department by EMS for evaluation, COVID-19 testing  and  o possible admission depending on your condition and test results.  What to do if you are LOW RISK for COVID-19?  Reduce your risk of any infection by using the same precautions used for avoiding the common cold or flu:  Marland Kitchen Wash your hands often with soap and warm water for at least 20 seconds.  If soap and water are not readily available,  . use an alcohol-based hand sanitizer with at least 60% alcohol.  . If coughing or sneezing, cover your mouth and nose by coughing or sneezing into the elbow areas of your shirt or coat, .  into a tissue or into your sleeve (not your hands). . Avoid shaking hands with others and consider head nods or verbal greetings only. . Avoid touching your eyes, nose, or mouth with unwashed hands.  . Avoid close contact with people who are sick. . Avoid places or events with large numbers of people in one location, like concerts or sporting events. Marland Kitchen  Carefully consider travel plans you have or are making. . If you are planning any travel outside or inside the [place], visit the CDC's Travelers' Health webpage for the latest health notices. . If you have some symptoms but not all symptoms, continue to monitor at home and seek medical attention  . if your symptoms worsen. . If you are having a medical emergency, call 911.   . >>>>>>>>>>>>>>>>>>>>>>>>>>>>>>>>> . We Do NOT Approve of  [facility], [employer]  Our Patients  To Do Home Visits & We Do NOT Approve of [employer] > > > > > > > > > > > > > > > > > > > > > > > > > > > > > > > > > > > > > > >  Preventive Care for Adults  A healthy lifestyle  and preventive care can promote health and wellness. Preventive health guidelines for women include the following key practices.  A routine yearly physical is a good way to check with your health care provider about your health and preventive screening. It is a chance to share any concerns and updates on your health and to receive a thorough exam.  Visit your dentist for a routine exam and preventive care every 6 months. Brush your teeth twice a day and floss once a day. Good oral hygiene prevents tooth decay and gum disease.  The frequency of eye exams is based on your age, health, family medical history, use of contact lenses, and other factors. Follow your health care provider's recommendations for frequency of eye exams.  Eat a healthy diet. Foods like vegetables, fruits, whole grains, low-fat dairy products, and lean protein foods contain the nutrients you need without too many calories. Decrease your intake of foods high in solid fats, added sugars, and salt. Eat the right amount of calories for you. Get information about a proper diet from your health care provider, if necessary.  Regular physical exercise is one of the most important things you can do for your health. Most adults should get at least 150 minutes of moderate-intensity exercise (any activity that increases your heart rate and causes you to sweat) each week. In addition, most adults need muscle-strengthening exercises on 2 or more days a week.  Maintain a healthy weight. The body mass index (BMI) is a screening tool to identify possible weight problems. It provides an estimate of body fat based on height and weight. Your health care provider can find your BMI and can help you achieve or maintain a healthy weight. For adults 20 years and older:  A BMI below 18.5 is considered underweight.  A BMI of 18.5 to 24.9 is normal.  A BMI of 25 to 29.9 is considered overweight.  A BMI of 30 and above is considered obese.  Maintain  normal blood lipids and cholesterol levels by exercising and minimizing your intake of saturated fat. Eat a balanced diet with plenty of fruit and vegetables. If your lipid or cholesterol levels are high, you are over 50, or you are at high risk for heart disease, you may need your cholesterol levels checked more frequently. Ongoing high lipid and cholesterol levels should be treated with medicines if diet and exercise are not working.  If you smoke, find out from your health care provider how to quit. If you do not use tobacco, do not start.  Lung cancer screening is recommended for adults aged 34-80 years who are at high risk for developing lung cancer  because of a history of smoking. A yearly low-dose CT scan of the lungs is recommended for people who have at least a 30-pack-year history of smoking and are a current smoker or have quit within the past 15 years. A pack year of smoking is smoking an average of 1 pack of cigarettes a day for 1 year (for example: 1 pack a day for 30 years or 2 packs a day for 15 years). Yearly screening should continue until the smoker has stopped smoking for at least 15 years. Yearly screening should be stopped for people who develop a health problem that would prevent them from having lung cancer treatment.  Avoid use of street drugs. Do not share needles with anyone. Ask for help if you need support or instructions about stopping the use of drugs.  High blood pressure causes heart disease and increases the risk of stroke.  Ongoing high blood pressure should be treated with medicines if weight loss and exercise do not work.  If you are 66-18 years old, ask your health care provider if you should take aspirin to prevent strokes.  Diabetes screening involves taking a blood sample to check your fasting blood sugar level. This should be done once every 3 years, after age 35, if you are within normal weight and without risk factors for diabetes. Testing should be considered  at a younger age or be carried out more frequently if you are overweight and have at least 1 risk factor for diabetes.  Breast cancer screening is essential preventive care for women. You should practice "breast self-awareness." This means understanding the normal appearance and feel of your breasts and may include breast self-examination. Any changes detected, no matter how small, should be reported to a health care provider. Women in their 74s and 30s should have a clinical breast exam (CBE) by a health care provider as part of a regular health exam every 1 to 3 years. After age 5, women should have a CBE every year. Starting at age 52, women should consider having a mammogram (breast X-ray test) every year. Women who have a family history of breast cancer should talk to their health care provider about genetic screening. Women at a high risk of breast cancer should talk to their health care providers about having an MRI and a mammogram every year.  Breast cancer gene (BRCA)-related cancer risk assessment is recommended for women who have family members with BRCA-related cancers. BRCA-related cancers include breast, ovarian, tubal, and peritoneal cancers. Having family members with these cancers may be associated with an increased risk for harmful changes (mutations) in the breast cancer genes BRCA1 and BRCA2. Results of the assessment will determine the need for genetic counseling and BRCA1 and BRCA2 testing.  Routine pelvic exams to screen for cancer are no longer recommended for nonpregnant women who are considered low risk for cancer of the pelvic organs (ovaries, uterus, and vagina) and who do not have symptoms. Ask your health care provider if a screening pelvic exam is right for you.  If you have had past treatment for cervical cancer or a condition that could lead to cancer, you need Pap tests and screening for cancer for at least 20 years after your treatment. If Pap tests have been discontinued,  your risk factors (such as having a new sexual partner) need to be reassessed to determine if screening should be resumed. Some women have medical problems that increase the chance of getting cervical cancer. In these cases, your health care  provider may recommend more frequent screening and Pap tests.    Colorectal cancer can be detected and often prevented. Most routine colorectal cancer screening begins at the age of 12 years and continues through age 30 years. However, your health care provider may recommend screening at an earlier age if you have risk factors for colon cancer. On a yearly basis, your health care provider may provide home test kits to check for hidden blood in the stool. Use of a small camera at the end of a tube, to directly examine the colon (sigmoidoscopy or colonoscopy), can detect the earliest forms of colorectal cancer. Talk to your health care provider about this at age 20, when routine screening begins.  Direct exam of the colon should be repeated every 5-10 years through age 68 years, unless early forms of pre-cancerous polyps or small growths are found.  Osteoporosis is a disease in which the bones lose minerals and strength with aging. This can result in serious bone fractures or breaks. The risk of osteoporosis can be identified using a bone density scan. Women ages 11 years and over and women at risk for fractures or osteoporosis should discuss screening with their health care providers. Ask your health care provider whether you should take a calcium supplement or vitamin D to reduce the rate of osteoporosis.  Menopause can be associated with physical symptoms and risks. Hormone replacement therapy is available to decrease symptoms and risks. You should talk to your health care provider about whether hormone replacement therapy is right for you.  Use sunscreen. Apply sunscreen liberally and repeatedly throughout the day. You should seek shade when your shadow is shorter  than you. Protect yourself by wearing long sleeves, pants, a wide-brimmed hat, and sunglasses year round, whenever you are outdoors.  Once a month, do a whole body skin exam, using a mirror to look at the skin on your back. Tell your health care provider of new moles, moles that have irregular borders, moles that are larger than a pencil eraser, or moles that have changed in shape or color.  Stay current with required vaccines (immunizations).  Influenza vaccine. All adults should be immunized every year.  Tetanus, diphtheria, and acellular pertussis (Td, Tdap) vaccine. Pregnant women should receive 1 dose of Tdap vaccine during each pregnancy. The dose should be obtained regardless of the length of time since the last dose. Immunization is preferred during the 27th-36th week of gestation. An adult who has not previously received Tdap or who does not know her vaccine status should receive 1 dose of Tdap. This initial dose should be followed by tetanus and diphtheria toxoids (Td) booster doses every 10 years. Adults with an unknown or incomplete history of completing a 3-dose immunization series with Td-containing vaccines should begin or complete a primary immunization series including a Tdap dose. Adults should receive a Td booster every 10 years.    Zoster vaccine. One dose is recommended for adults aged 25 years or older unless certain conditions are present.    Pneumococcal 13-valent conjugate (PCV13) vaccine. When indicated, a person who is uncertain of her immunization history and has no record of immunization should receive the PCV13 vaccine. An adult aged 77 years or older who has certain medical conditions and has not been previously immunized should receive 1 dose of PCV13 vaccine. This PCV13 should be followed with a dose of pneumococcal polysaccharide (PPSV23) vaccine. The PPSV23 vaccine dose should be obtained at least 1 or more year(s) after the dose of  PCV13 vaccine. An adult aged 13  years or older who has certain medical conditions and previously received 1 or more doses of PPSV23 vaccine should receive 1 dose of PCV13. The PCV13 vaccine dose should be obtained 1 or more years after the last PPSV23 vaccine dose.    Pneumococcal polysaccharide (PPSV23) vaccine. When PCV13 is also indicated, PCV13 should be obtained first. All adults aged 47 years and older should be immunized. An adult younger than age 74 years who has certain medical conditions should be immunized. Any person who resides in a nursing home or long-term care facility should be immunized. An adult smoker should be immunized. People with an immunocompromised condition and certain other conditions should receive both PCV13 and PPSV23 vaccines. People with human immunodeficiency virus (HIV) infection should be immunized as soon as possible after diagnosis. Immunization during chemotherapy or radiation therapy should be avoided. Routine use of PPSV23 vaccine is not recommended for American Indians, [place]atives, or people younger than 65 years unless there are medical conditions that require PPSV23 vaccine. When indicated, people who have unknown immunization and have no record of immunization should receive PPSV23 vaccine. One-time revaccination 5 years after the first dose of PPSV23 is recommended for people aged 19-64 years who have chronic kidney failure, nephrotic syndrome, asplenia, or immunocompromised conditions. People who received 1-2 doses of PPSV23 before age 73 years should receive another dose of PPSV23 vaccine at age 71 years or later if at least 5 years have passed since the previous dose. Doses of PPSV23 are not needed for people immunized with PPSV23 at or after age 74 years.   Preventive Services / Frequency  Ages 60 years and over  Blood pressure check.  Lipid and cholesterol check.  Lung cancer screening. / Every year if you are aged 54-80 years and have a 30-pack-year history of smoking and  currently smoke or have quit within the past 15 years. Yearly screening is stopped once you have quit smoking for at least 15 years or develop a health problem that would prevent you from having lung cancer treatment.  Clinical breast exam.** / Every year after age 3 years.   BRCA-related cancer risk assessment.** / For women who have family members with a BRCA-related cancer (breast, ovarian, tubal, or peritoneal cancers).  Mammogram.** / Every year beginning at age 80 years and continuing for as long as you are in good health. Consult with your health care provider.  Pap test.** / Every 3 years starting at age 63 years through age 46 or 68 years with 3 consecutive normal Pap tests. Testing can be stopped between 65 and 70 years with 3 consecutive normal Pap tests and no abnormal Pap or HPV tests in the past 10 years.  Fecal occult blood test (FOBT) of stool. / Every year beginning at age 23 years and continuing until age 68 years. You may not need to do this test if you get a colonoscopy every 10 years.  Flexible sigmoidoscopy or colonoscopy.** / Every 5 years for a flexible sigmoidoscopy or every 10 years for a colonoscopy beginning at age 75 years and continuing until age 31 years.  Hepatitis C blood test.** / For all people born from [birth year] through 1965 and any individual with known risks for hepatitis C.  Osteoporosis screening.** / A one-time screening for women ages 43 years and over and women at risk for fractures or osteoporosis.  Skin self-exam. / Monthly.  Influenza vaccine. / Every year.  Tetanus, diphtheria, and acellular  pertussis (Tdap/Td) vaccine.** / 1 dose of Td every 10 years.  Zoster vaccine.** / 1 dose for adults aged 60 years or older.  Pneumococcal 13-valent conjugate (PCV13) vaccine.** / Consult your health care provider.  Pneumococcal polysaccharide (PPSV23) vaccine.** / 1 dose for all adults aged 65 years and older. Screening for abdominal aortic aneurysm (AAA)   by ultrasound is recommended for people who have history of high blood pressure or who are current or former smokers. ++++++++++++++++++++ Recommend Adult Low Dose Aspirin or  coated  Aspirin 81 mg daily  To reduce risk of Colon Cancer 40 %,  Skin Cancer 26 % ,  Melanoma 46%  and  Pancreatic cancer 60% ++++++++++++++++++++ Vitamin D goal  is between 70-100.  Please make sure that you are taking your Vitamin D as directed.  It is very important as a natural anti-inflammatory  helping hair, skin, and nails, as well as reducing stroke and heart attack risk.  It helps your bones and helps with mood. It also decreases numerous cancer risks so please take it as directed.  Low Vit D is associated with a 200-300% higher risk for CANCER  and 200-300% higher risk for HEART   ATTACK  &  STROKE.   ...................................... It is also associated with higher death rate at younger ages,  autoimmune diseases like Rheumatoid arthritis, Lupus, Multiple Sclerosis.    Also many other serious conditions, like depression, Alzheimer's Dementia, infertility, muscle aches, fatigue, fibromyalgia - just to name a few. ++++++++++++++++++ Recommend the book "The END of DIETING" by Dr Joel Fuhrman  & the book "The END of DIABETES " by Dr Joel Fuhrman At [employer]com - get book & Audio CD's    Being diabetic has a  300% increased risk for heart attack, stroke, cancer, and alzheimer- type vascular dementia. It is very important that you work harder with diet by avoiding all foods that are white. Avoid white rice (brown & wild rice is OK), white potatoes (sweetpotatoes in moderation is OK), White bread or wheat bread or anything made out of white flour like bagels, donuts, rolls, buns, biscuits, cakes, pastries, cookies, pizza crust, and pasta (made from white flour & egg whites) - vegetarian pasta or spinach or wheat pasta is OK. Multigrain breads like Arnold's or [employer], or multigrain sandwich thins  or flatbreads.  Diet, exercise and weight loss can reverse and cure diabetes in the early stages.  Diet, exercise and weight loss is very important in the control and prevention of complications of diabetes which affects every system in your body, ie. Brain - dementia/stroke, eyes - glaucoma/blindness, heart - heart attack/heart failure, kidneys - dialysis, stomach - gastric paralysis, intestines - malabsorption, nerves - severe painful neuritis, circulation - gangrene & loss of a leg(s), and finally cancer and Alzheimers.    I recommend avoid fried & greasy foods,  sweets/candy, white rice (brown or wild rice or Quinoa is OK), white potatoes (sweet potatoes are OK) - anything made from white flour - bagels, doughnuts, rolls, buns, biscuits,white and wheat breads, pizza crust and traditional pasta made of white flour & egg white(vegetarian pasta or spinach or wheat pasta is OK).  Multi-grain bread is OK - like multi-grain flat bread or sandwich thins. Avoid alcohol in excess. Exercise is also important.    Eat all the vegetables you want - avoid meat, especially red meat and dairy - especially cheese.  Cheese is the most concentrated form of trans-fats which is the worst thing to clog   up our arteries. Veggie cheese is OK which can be found in the fresh produce section at [employer] or [employer] or [employer]  +++++++++++++++++++ DASH Eating Plan  DASH stands for "Dietary Approaches to Stop Hypertension."   The DASH eating plan is a healthy eating plan that has been shown to reduce high blood pressure (hypertension). Additional health benefits may include reducing the risk of type 2 diabetes mellitus, heart disease, and stroke. The DASH eating plan may also help with weight loss. WHAT DO I NEED TO KNOW ABOUT THE DASH EATING PLAN? For the DASH eating plan, you will follow these general guidelines:  Choose foods with a percent daily value for sodium of less than 5% (as listed on the food  label).  Use salt-free seasonings or herbs instead of table salt or sea salt.  Check with your health care provider or pharmacist before using salt substitutes.  Eat lower-sodium products, often labeled as "lower sodium" or "no salt added."  Eat fresh foods.  Eat more vegetables, fruits, and low-fat dairy products.  Choose whole grains. Look for the word "whole" as the first word in the ingredient list.  Choose fish   Limit sweets, desserts, sugars, and sugary drinks.  Choose heart-healthy fats.  Eat veggie cheese   Eat more home-cooked food and less restaurant, buffet, and fast food.  Limit fried foods.  Cook foods using methods other than frying.  Limit canned vegetables. If you do use them, rinse them well to decrease the sodium.  When eating at a restaurant, ask that your food be prepared with less salt, or no salt if possible.                      WHAT FOODS CAN I EAT? Read Dr Fara Olden Fuhrman's books on The End of Dieting & The End of Diabetes  Grains Whole grain or whole wheat bread. Brown rice. Whole grain or whole wheat pasta. Quinoa, bulgur, and whole grain cereals. Low-sodium cereals. Corn or whole wheat flour tortillas. Whole grain cornbread. Whole grain crackers. Low-sodium crackers.  Vegetables Fresh or frozen vegetables (raw, steamed, roasted, or grilled). Low-sodium or reduced-sodium tomato and vegetable juices. Low-sodium or reduced-sodium tomato sauce and paste. Low-sodium or reduced-sodium canned vegetables.   Fruits All fresh, canned (in natural juice), or frozen fruits.  Protein Products  All fish and seafood.  Dried beans, peas, or lentils. Unsalted nuts and seeds. Unsalted canned beans.  Dairy Low-fat dairy products, such as skim or 1% milk, 2% or reduced-fat cheeses, low-fat ricotta or cottage cheese, or plain low-fat yogurt. Low-sodium or reduced-sodium cheeses.  Fats and Oils Tub margarines without trans fats. Light or reduced-fat mayonnaise  and salad dressings (reduced sodium). Avocado. Safflower, olive, or canola oils. Natural peanut or almond butter.  Other Unsalted popcorn and pretzels. The items listed above may not be a complete list of recommended foods or beverages. Contact your dietitian for more options.  +++++++++++++++  WHAT FOODS ARE NOT RECOMMENDED? Grains/ White flour or wheat flour White bread. White pasta. White rice. Refined cornbread. Bagels and croissants. Crackers that contain trans fat.  Vegetables  Creamed or fried vegetables. Vegetables in a . Regular canned vegetables. Regular canned tomato sauce and paste. Regular tomato and vegetable juices.  Fruits Dried fruits. Canned fruit in light or heavy syrup. Fruit juice.  Meat and Other Protein Products Meat in general - RED meat & White meat.  Fatty cuts of meat. Ribs, chicken wings, all processed meats as  bacon, sausage, bologna, salami, fatback, hot dogs, bratwurst and packaged luncheon meats.  Dairy Whole or 2% milk, cream, half-and-half, and cream cheese. Whole-fat or sweetened yogurt. Full-fat cheeses or blue cheese. Non-dairy creamers and whipped toppings. Processed cheese, cheese spreads, or cheese curds.  Condiments Onion and garlic salt, seasoned salt, table salt, and sea salt. Canned and packaged gravies. Worcestershire sauce. Tartar sauce. Barbecue sauce. Teriyaki sauce. Soy sauce, including reduced sodium. Steak sauce. Fish sauce. Oyster sauce. Cocktail sauce. Horseradish. Ketchup and mustard. Meat flavorings and tenderizers. Bouillon cubes. Hot sauce. Tabasco sauce. Marinades. Taco seasonings. Relishes.  Fats and Oils Butter, stick margarine, lard, shortening and bacon fat. Coconut, palm kernel, or palm oils. Regular salad dressings.  Pickles and olives. Salted popcorn and pretzels.  The items listed above may not be a complete list of foods and beverages to avoid.

## 2019-04-03 NOTE — Progress Notes (Signed)
This note is not being shared with the patient for the following reason: To prevent harm (release of this note would result in harm to the life or physical safety of the patient or another).   Annual Screening/Preventative Visit & Comprehensive Evaluation &  Examination     This very nice 79 y.o. DBF  presents for a Screening /Preventative Visit & comprehensive evaluation and management of multiple medical co-morbidities.  Patient has been followed for HTN, HLD, T2_NIDDM  and Vitamin D Deficiency.      HTN predates since 1997. Patient's BP has been controlled at home and patient denies any cardiac symptoms as chest pain, palpitations, shortness of breath, dizziness or ankle swelling. Today's BP is at goal - 140/76      Patient's hyperlipidemia is not controlled with diet and medications. Patient denies myalgias or other medication SE's. Last lipids were not at goal:  Lab Results  Component Value Date   CHOL 204 (H) 12/31/2018   HDL 65 12/31/2018   LDLCALC 109 (H) 12/31/2018   TRIG 188 (H) 12/31/2018   CHOLHDL 3.1 12/31/2018       Patient has moderate Obesity (BMI 35+) and  diet controlled  T2_DM predating since 2013 with CKD2 (GFR 60) and PSN.  Patient denies reactive hypoglycemic symptoms, visual blurring, diabetic polys, has numb paresthesias of her feet. Last A1c was not at goal:  Lab Results  Component Value Date   HGBA1C 6.3 (H) 12/31/2018       Finally, patient has history of Vitamin D Deficiency ("26" / 2008)  and last Vitamin D was at goal:  Lab Results  Component Value Date   VD25OH 67 03/06/2018   Current Outpatient Medications on File Prior to Visit  Medication Sig  . ACCU-CHEK AVIVA PLUS test strip   . ALPRAZolam (XANAX) 1 MG tablet Take 1/2-1 tablet 1 to 2 /day ONLY if needed for sleep or anxiety & limit to 5 days /week to avoid Addiction or Dementia  . aspirin 81 MG tablet Take 81 mg by mouth daily. Takes two tablets daily  . ASSURE COMFORT LANCETS 30G MISC   .  bisoprolol-hydrochlorothiazide (ZIAC) 10-6.25 MG tablet Take 1 tablet Daily for BP  . Blood Glucose Calibration (ACCU-CHEK AVIVA) SOLN   . Cholecalciferol (VITAMIN D-3) 5000 UNITS TABS Take 1 tablet by mouth daily.  . furosemide (LASIX) 40 MG tablet Take 1 tablet Daily for Fluid Retention / Ankle Swelling  . gabapentin (NEURONTIN) 300 MG capsule TAKE 1 CAPSULE THREE TIMES DAILY  FOR  NEUROPATHY  PAIN  . Lancet Devices (ADJUSTABLE LANCING DEVICE) MISC   . latanoprost (XALATAN) 0.005 % ophthalmic solution Place 1 drop into both eyes daily.   Marland Kitchen levothyroxine (SYNTHROID) 100 MCG tablet Take 1 tablet daily on an empty stomach with only water for 30 minutes & no Antacid meds, Calcium or Magnesium for 4 hours & avoid Biotin  . Magnesium 500 MG CAPS Take 2 capsules by mouth daily.   . Omega-3 Fatty Acids (OMEGA 3 500 PO) Take 1 capsule by mouth daily.  . rosuvastatin (CRESTOR) 5 MG tablet Please take 1 tab by mouth three nights (MWF) a week for cholesterol.  . timolol (TIMOPTIC) 0.5 % ophthalmic solution Place 1 drop into both eyes 2 (two) times daily.    No current facility-administered medications on file prior to visit.   Allergies  Allergen Reactions  . Meloxicam Swelling    Weight gain   Past Medical History:  Diagnosis Date  . Anal  fissure   . Arthritis   . Carotid stenosis, <50%, bilateral  12/20/2018  . Colon polyps    hyperplastic  . Diverticulosis   . GERD (gastroesophageal reflux disease)   . Glaucoma   . Hemorrhoids   . Hyperlipidemia   . Hypertension   . Hypothyroidism   . Prediabetes    Health Maintenance  Topic Date Due  . TETANUS/TDAP  04/25/2017  . OPHTHALMOLOGY EXAM  04/13/2018  . HEMOGLOBIN A1C  06/30/2019  . MAMMOGRAM  09/10/2019  . FOOT EXAM  04/02/2020  . INFLUENZA VACCINE  Completed  . DEXA SCAN  Completed  . PNA vac Low Risk Adult  Completed   Immunization History  Administered Date(s) Administered  . Influenza, High Dose Seasonal PF 11/24/2017,  10/17/2018  . Influenza-Unspecified 12/09/2013, 11/14/2015, 11/23/2016  . Pneumococcal Conjugate-13 04/21/2015  . Pneumococcal Polysaccharide-23 05/29/2008  . Td 04/26/2007   Last Colon - 01/07/2018 - Dr Fuller Plan - Negative and recc no f/u due to age  Last MGM - 09/10/2018  Past Surgical History:  Procedure Laterality Date  . ABDOMINAL HYSTERECTOMY    . APPENDECTOMY    . BLADDER SUSPENSION    . COLONOSCOPY    . JOINT REPLACEMENT  2007   rt hip replacement  . KNEE ARTHROSCOPY  03/28/2011   Procedure: ARTHROSCOPY KNEE;  Surgeon: Kerin Salen, MD;  Location: [facility];  Service: Orthopedics;  Laterality: Right;  with partial lateral meniscectomy, Debridement of Chondromalacia   Family History  Problem Relation Age of Onset  . Esophageal cancer Other        nefew- age 55  . Colon polyps Sister   . Diabetes Sister   . Esophageal cancer Other   . Colon cancer Neg Hx   . Rectal cancer Neg Hx   . Stomach cancer Neg Hx   . Breast cancer Neg Hx    Social History   Tobacco Use  . Smoking status: Former Smoker    Quit date: 03/22/1996    Years since quitting: 23.0  . Smokeless tobacco: Never Used  Substance Use Topics  . Alcohol use: Yes    Alcohol/week: 2.0 standard drinks    Types: 2 drink(s) per week    Comment: occ  . Drug use: No    ROS Constitutional: Denies fever, chills, weight loss/gain, headaches, insomnia,  night sweats, and change in appetite. Does c/o fatigue. Eyes: Denies redness, blurred vision, diplopia, discharge, itchy, watery eyes.  ENT: Denies discharge, congestion, post nasal drip, epistaxis, sore throat, earache, hearing loss, dental pain, Tinnitus, Vertigo, Sinus pain, snoring.  Cardio: Denies chest pain, palpitations, irregular heartbeat, syncope, dyspnea, diaphoresis, orthopnea, PND, claudication, edema Respiratory: denies cough, dyspnea, DOE, pleurisy, hoarseness, laryngitis, wheezing.  Gastrointestinal: Denies dysphagia, heartburn,  reflux, water brash, pain, cramps, nausea, vomiting, bloating, diarrhea, constipation, hematemesis, melena, hematochezia, jaundice, hemorrhoids Genitourinary: Denies dysuria, frequency, urgency, nocturia, hesitancy, discharge, hematuria, flank pain Breast: Breast lumps, nipple discharge, bleeding.  Musculoskeletal: Denies arthralgia, myalgia, stiffness, Jt. Swelling, pain, limp, and strain/sprain. Denies falls. Skin: Denies puritis, rash, hives, warts, acne, eczema, changing in skin lesion Neuro: No weakness, tremor, incoordination, spasms, paresthesia, pain Psychiatric: Denies confusion, memory loss, sensory loss. Denies Depression. Endocrine: Denies change in weight, skin, hair change, nocturia, and paresthesia, diabetic polys, visual blurring, hyper / hypo glycemic episodes.  Heme/Lymph: No excessive bleeding, bruising, enlarged lymph nodes.  Physical Exam  BP 140/76   Pulse 60   Temp (!) 97.1 F (36.2 C)   Resp 16  Ht 5' 7.5" (1.715 m)   Wt 228 lb 3.2 oz (103.5 kg)   BMI 35.21 kg/m   General Appearance: Well nourished, well groomed and in no apparent distress.  Eyes: PERRLA, EOMs, conjunctiva no swelling or erythema, normal fundi and vessels. Sinuses: No frontal/maxillary tenderness ENT/Mouth: EACs patent / TMs  nl. Nares clear without erythema, swelling, mucoid exudates. Oral hygiene is good. No erythema, swelling, or exudate. Tongue normal, non-obstructing. Tonsils not swollen or erythematous. Hearing normal.  Neck: Supple, thyroid not palpable. No bruits, nodes or JVD. Respiratory: Respiratory effort normal.  BS equal and clear bilateral without rales, rhonci, wheezing or stridor. Cardio: Heart sounds are normal with regular rate and rhythm and no murmurs, rubs or gallops. Peripheral pulses are normal and equal bilaterally without edema. No aortic or femoral bruits. Chest: symmetric with normal excursions and percussion. Breasts: Symmetric, without lumps, nipple discharge,  retractions, or fibrocystic changes.  Abdomen: Flat, soft with bowel sounds active. Nontender, no guarding, rebound, hernias, masses, or organomegaly.  Lymphatics: Non tender without lymphadenopathy.  Genitourinary:  Musculoskeletal: Full ROM all peripheral extremities, joint stability, 5/5 strength, and normal gait. Skin: Warm and dry without rashes, lesions, cyanosis, clubbing or  ecchymosis.  Neuro: Cranial nerves intact, reflexes equal bilaterally. Normal muscle tone, no cerebellar symptoms. Sensation intact.  Pysch: Alert and oriented X 3, normal affect, Insight and Judgment appropriate.   Assessment and Plan  1. Annual Preventative Screening Examination  2. Essential hypertension  - EKG 12-Lead - Urinalysis, Routine w reflex microscopic - Microalbumin / creatinine urine ratio - CBC with Differential/Platelet - COMPLETE METABOLIC PANEL WITH GFR - Magnesium - TSH  3. Hyperlipidemia associated with type 2 diabetes mellitus ([place])  - EKG 12-Lead - Lipid panel - TSH  4. Controlled type 2 diabetes mellitus with stage 2 chronic kidney disease,  without long-term current use of insulin (HCC)  - EKG 12-Lead - Urinalysis, Routine w reflex microscopic - Microalbumin / creatinine urine ratio - Hemoglobin A1c - Insulin, random  5. Vitamin D deficiency  - VITAMIN D 25 Hydroxy   6. Diabetic peripheral neuropathy (HCC)  - HM DIABETES FOOT EXAM - Hemoglobin A1c - Insulin, random - LOW EXTREMITY NEUR EXAM DOCUM  7. Hypothyroidism  - TSH  8. Gout  - Uric acid  9. Screening for colorectal cancer  - POC Hemoccult Bld/Stl   10. Screening for ischemic heart disease  - EKG 12-Lead  11. Former smoker  - EKG 12-Lead  12. Medication management  - Urinalysis, Routine w reflex microscopic - Microalbumin / creatinine urine ratio - CBC with Differential/Platelet - COMPLETE METABOLIC PANEL WITH GFR - Magnesium - Lipid panel - TSH - Hemoglobin A1c - Insulin,  random - VITAMIN D 25 Hydroxy         Patient was counseled in prudent diet to achieve/maintain BMI less than 25 for weight control, BP monitoring, regular exercise and medications. Discussed med's effects and SE's. Screening labs and tests as requested with regular follow-up as recommended. Over 40 minutes of exam, counseling, chart review and high complex critical decision making was performed.   Kirtland Bouchard, MD

## 2019-04-04 ENCOUNTER — Ambulatory Visit (INDEPENDENT_AMBULATORY_CARE_PROVIDER_SITE_OTHER): Payer: Medicare HMO | Admitting: Internal Medicine

## 2019-04-04 ENCOUNTER — Other Ambulatory Visit: Payer: Self-pay

## 2019-04-04 ENCOUNTER — Other Ambulatory Visit: Payer: Self-pay | Admitting: Internal Medicine

## 2019-04-04 VITALS — BP 140/76 | HR 60 | Temp 97.1°F | Resp 16 | Ht 67.5 in | Wt 228.2 lb

## 2019-04-04 DIAGNOSIS — N182 Chronic kidney disease, stage 2 (mild): Secondary | ICD-10-CM

## 2019-04-04 DIAGNOSIS — E559 Vitamin D deficiency, unspecified: Secondary | ICD-10-CM | POA: Diagnosis not present

## 2019-04-04 DIAGNOSIS — M109 Gout, unspecified: Secondary | ICD-10-CM

## 2019-04-04 DIAGNOSIS — Z136 Encounter for screening for cardiovascular disorders: Secondary | ICD-10-CM

## 2019-04-04 DIAGNOSIS — Z79899 Other long term (current) drug therapy: Secondary | ICD-10-CM

## 2019-04-04 DIAGNOSIS — I10 Essential (primary) hypertension: Secondary | ICD-10-CM

## 2019-04-04 DIAGNOSIS — E785 Hyperlipidemia, unspecified: Secondary | ICD-10-CM

## 2019-04-04 DIAGNOSIS — E1169 Type 2 diabetes mellitus with other specified complication: Secondary | ICD-10-CM

## 2019-04-04 DIAGNOSIS — E1122 Type 2 diabetes mellitus with diabetic chronic kidney disease: Secondary | ICD-10-CM

## 2019-04-04 DIAGNOSIS — E1142 Type 2 diabetes mellitus with diabetic polyneuropathy: Secondary | ICD-10-CM | POA: Diagnosis not present

## 2019-04-04 DIAGNOSIS — Z1211 Encounter for screening for malignant neoplasm of colon: Secondary | ICD-10-CM

## 2019-04-04 DIAGNOSIS — Z87891 Personal history of nicotine dependence: Secondary | ICD-10-CM

## 2019-04-04 DIAGNOSIS — Z0001 Encounter for general adult medical examination with abnormal findings: Secondary | ICD-10-CM

## 2019-04-04 DIAGNOSIS — E039 Hypothyroidism, unspecified: Secondary | ICD-10-CM | POA: Diagnosis not present

## 2019-04-04 DIAGNOSIS — Z1212 Encounter for screening for malignant neoplasm of rectum: Secondary | ICD-10-CM

## 2019-04-04 DIAGNOSIS — Z0000 Encounter for general adult medical examination without abnormal findings: Secondary | ICD-10-CM

## 2019-04-05 LAB — URINALYSIS, ROUTINE W REFLEX MICROSCOPIC
Bilirubin Urine: NEGATIVE
Glucose, UA: NEGATIVE
Hgb urine dipstick: NEGATIVE
Ketones, ur: NEGATIVE
Leukocytes,Ua: NEGATIVE
Nitrite: NEGATIVE
Protein, ur: NEGATIVE
Specific Gravity, Urine: 1.008 (ref 1.001–1.03)
pH: 6.5 (ref 5.0–8.0)

## 2019-04-05 LAB — COMPLETE METABOLIC PANEL WITH GFR
AG Ratio: 1.7 (calc) (ref 1.0–2.5)
ALT: 12 U/L (ref 6–29)
AST: 12 U/L (ref 10–35)
Albumin: 4.5 g/dL (ref 3.6–5.1)
Alkaline phosphatase (APISO): 58 U/L (ref 37–153)
BUN/Creatinine Ratio: 14 (calc) (ref 6–22)
BUN: 14 mg/dL (ref 7–25)
CO2: 31 mmol/L (ref 20–32)
Calcium: 11 mg/dL — ABNORMAL HIGH (ref 8.6–10.4)
Chloride: 101 mmol/L (ref 98–110)
Creat: 0.97 mg/dL — ABNORMAL HIGH (ref 0.60–0.93)
GFR, Est African American: 65 mL/min/{1.73_m2} (ref >=60)
GFR, Est Non African American: 56 mL/min/{1.73_m2} — ABNORMAL LOW (ref >=60)
Globulin: 2.6 g/dL (calc) (ref 1.9–3.7)
Glucose, Bld: 103 mg/dL — ABNORMAL HIGH (ref 65–99)
Potassium: 3.8 mmol/L (ref 3.5–5.3)
Sodium: 142 mmol/L (ref 135–146)
Total Bilirubin: 0.7 mg/dL (ref 0.2–1.2)
Total Protein: 7.1 g/dL (ref 6.1–8.1)

## 2019-04-05 LAB — MICROALBUMIN / CREATININE URINE RATIO
Creatinine, Urine: 38 mg/dL (ref 20–275)
Microalb Creat Ratio: 11 mcg/mg creat (ref <30)
Microalb, Ur: 0.4 mg/dL

## 2019-04-05 LAB — LIPID PANEL
Cholesterol: 153 mg/dL (ref <200)
HDL: 76 mg/dL (ref >=50)
LDL Cholesterol (Calc): 59 mg/dL (calc)
Non-HDL Cholesterol (Calc): 77 mg/dL (calc) (ref <130)
Total CHOL/HDL Ratio: 2 (calc) (ref <5.0)
Triglycerides: 96 mg/dL (ref <150)

## 2019-04-05 LAB — CBC WITH DIFFERENTIAL/PLATELET
Absolute Monocytes: 469 cells/uL (ref 200–950)
Basophils Absolute: 28 cells/uL (ref 0–200)
Basophils Relative: 0.6 %
Eosinophils Absolute: 120 cells/uL (ref 15–500)
Eosinophils Relative: 2.6 %
HCT: 41.9 % (ref 35.0–45.0)
Hemoglobin: 14.1 g/dL (ref 11.7–15.5)
Lymphs Abs: 1789 cells/uL (ref 850–3900)
MCH: 28 pg (ref 27.0–33.0)
MCHC: 33.7 g/dL (ref 32.0–36.0)
MCV: 83.1 fL (ref 80.0–100.0)
MPV: 10.3 fL (ref 7.5–12.5)
Monocytes Relative: 10.2 %
Neutro Abs: 2194 cells/uL (ref 1500–7800)
Neutrophils Relative %: 47.7 %
Platelets: 236 10*3/uL (ref 140–400)
RBC: 5.04 10*6/uL (ref 3.80–5.10)
RDW: 13.3 % (ref 11.0–15.0)
Total Lymphocyte: 38.9 %
WBC: 4.6 10*3/uL (ref 3.8–10.8)

## 2019-04-05 LAB — INSULIN, RANDOM: Insulin: 22.3 u[IU]/mL — ABNORMAL HIGH

## 2019-04-05 LAB — HEMOGLOBIN A1C
Hgb A1c MFr Bld: 6.6 % of total Hgb — ABNORMAL HIGH (ref <5.7)
Mean Plasma Glucose: 143 (calc)
eAG (mmol/L): 7.9 (calc)

## 2019-04-05 LAB — TSH: TSH: 4.08 mIU/L (ref 0.40–4.50)

## 2019-04-05 LAB — MAGNESIUM: Magnesium: 2.1 mg/dL (ref 1.5–2.5)

## 2019-04-05 LAB — URIC ACID: Uric Acid, Serum: 6.6 mg/dL (ref 2.5–7.0)

## 2019-04-05 LAB — VITAMIN D 25 HYDROXY (VIT D DEFICIENCY, FRACTURES): Vit D, 25-Hydroxy: 68 ng/mL (ref 30–100)

## 2019-04-22 DIAGNOSIS — H401132 Primary open-angle glaucoma, bilateral, moderate stage: Secondary | ICD-10-CM | POA: Diagnosis not present

## 2019-05-06 ENCOUNTER — Other Ambulatory Visit: Payer: Self-pay

## 2019-05-06 DIAGNOSIS — Z1211 Encounter for screening for malignant neoplasm of colon: Secondary | ICD-10-CM

## 2019-05-06 LAB — POC HEMOCCULT BLD/STL (HOME/3-CARD/SCREEN)
Card #2 Fecal Occult Blod, POC: NEGATIVE
Card #3 Fecal Occult Blood, POC: NEGATIVE
Fecal Occult Blood, POC: NEGATIVE

## 2019-05-07 DIAGNOSIS — Z0001 Encounter for general adult medical examination with abnormal findings: Secondary | ICD-10-CM | POA: Diagnosis not present

## 2019-05-07 DIAGNOSIS — R6889 Other general symptoms and signs: Secondary | ICD-10-CM | POA: Diagnosis not present

## 2019-07-01 NOTE — Progress Notes (Deleted)
MEDICARE ANNUAL WELLNESS VISIT AND FOLLOW UP  Assessment:   Encounter for Medicare annual wellness exam  Essential hypertension - continue medications, DASH diet, exercise and monitor at home. Call if greater than 130/80. -     CBC with Differential/Platelet -     CMP/GFR -     TSH  Gastroesophageal reflux disease, esophagitis presence not specified Off of medication; does well with baking soda PRN for rare symptoms   Diet controlled T2DM [facility])  Education: Reviewed 'ABCs' of diabetes management (respective goals in parentheses):  A1C (<7), blood pressure (<130/80), and cholesterol (LDL <70) Eye Exam yearly and Dental Exam every 6 months. *** Dietary recommendations Physical Activity recommendations -     Hemoglobin A1c  CKD II associated with T2DM (HCC) Increase fluids, avoid NSAIDS, monitor sugars, will monitor  Hypothyroidism, unspecified type -check TSH level, continue medications the same, reminded to take on an empty stomach 30-64mins before food.  -     TSH  Peripheral sensory neuropathy, diabetic (HCC) Control glucose, do daily food checks -     gabapentin (NEURONTIN) 300 MG capsule; TAKE 1-2 CAPSULES TID PRN DAILY  FOR  NEUROPATHY  PAIN  Osteoarthritis, unspecified osteoarthritis type, unspecified site Followed by Dr. Mayer Camel, pending surgery   Hyperlipidemia associated with T2DM ([place]) -continue statin for LDL goal <70 check lipids, decrease fatty foods, increase activity.   Morbid obesity - BMI *** with diabetes, htn, hyperlipidemia - long discussion about weight loss, diet, and exercise -recommended diet heavy in fruits and veggies and low in animal meats, cheeses, and dairy products - restart swimming regularly after knee surgery and covid 19 - try eating dinner earlier, eat off of salad plate, wait 10-15 min prior to getting seconds - weight loss recommended prior to knee surgery  Vitamin D deficiency Continue supplement  Medication management -      Magnesium  Glaucoma, unspecified glaucoma type, unspecified laterality Continue eye doctor follow up  Insomnia  ***   Future Appointments  Date Time Provider [place]  07/02/2019  2:00 PM Liane Comber, NP [facility] None  10/08/2019  2:30 PM Unk Pinto, MD [facility] None     Plan:   During the course of the visit the patient was educated and counseled about appropriate screening and preventive services including:    Pneumococcal vaccine   Influenza vaccine  Td vaccine  Screening electrocardiogram  Screening mammography  Bone densitometry screening  Colorectal cancer screening  Diabetes screening  Glaucoma screening  Nutrition counseling   Advanced directives: given info/requested   Subjective:   Rebecca Graves is a 79 y.o. female who presents for Medicare Annual Wellness Visit and 3 month follow up on hypertension, T2 diabetes with CKD and neuropathy, hyperlipidemia, vitamin D def.   She is established with Dr. Mayer Camel for right knee arthritis, discussing surgery but was postponed due to covid 19. ***  S/p hysterectomy in 1980s. She recently tapered off of estrace which she was on for many years. Had normal MGM in 08/2018. Gabapentin has been helpful for hot flashes ***  Insomnia *** xanax ***  GERD  BMI is There is no height or weight on file to calculate BMI., she has not been working on diet, stopped swimming after her niece passed away who she was swimming with. She admits she eats dinner too late, too much.  Wt Readings from Last 3 Encounters:  04/04/19 228 lb 3.2 oz (103.5 kg)  12/31/18 225 lb (102.1 kg)  09/24/18 225 lb (102.1 kg)  Her blood pressure has been controlled at home, today their BP is   She does workout. She denies chest pain, shortness of breath, dizziness.   She is on cholesterol medication (on rosuvastatin 5 mg daily) and denies myalgias. Her cholesterol is at goal. The cholesterol last visit was:   Lab Results   Component Value Date   CHOL 153 04/04/2019   HDL 76 04/04/2019   LDLCALC 59 04/04/2019   TRIG 96 04/04/2019   CHOLHDL 2.0 04/04/2019   She has been working on diet and exercise for Diet controlled T2DM with CKD 2 and neuropathy.  She has cut out soda, switched to diet tea, she is on ASA and she is not on ACE *** Denies increased appetite, nausea, polydipsia, polyuria and vomiting.  She does have neuropathy, does well during the day, using gabapentin at night.  She does check fasting sugars, typically 100-130 with rare outliers ~150 and 90s.  Last A1C in the office was:  Lab Results  Component Value Date   HGBA1C 6.6 (H) 04/04/2019   She has been pushing fluid intake:   Lab Results  Component Value Date   GFRAA 65 04/04/2019   Patient is on Vitamin D supplement and at goal at recent check:  Lab Results  Component Value Date   VD25OH 68 04/04/2019     She is on thyroid medication. Her medication was not changed last visit, on 1/2  pill on F and 1 pill the other days. Patient denies nervousness, palpitations and weight changes.  Lab Results  Component Value Date   TSH 4.08 04/04/2019     Medication Review Current Outpatient Medications on File Prior to Visit  Medication Sig Dispense Refill  . ACCU-CHEK AVIVA PLUS test strip     . ALPRAZolam (XANAX) 1 MG tablet Take 1/2-1 tablet 1 to 2 /day ONLY if needed for sleep or anxiety & limit to 5 days /week to avoid Addiction or Dementia 30 tablet 0  . aspirin 81 MG tablet Take 81 mg by mouth daily. Takes two tablets daily    . ASSURE COMFORT LANCETS 30G MISC     . bisoprolol-hydrochlorothiazide (ZIAC) 10-6.25 MG tablet Take 1 tablet Daily for BP 90 tablet 1  . Blood Glucose Calibration (ACCU-CHEK AVIVA) SOLN     . Cholecalciferol (VITAMIN D-3) 5000 UNITS TABS Take 1 tablet by mouth daily.    . furosemide (LASIX) 40 MG tablet Take 1 tablet Daily for Fluid Retention / Ankle Swelling 90 tablet 1  . gabapentin (NEURONTIN) 300 MG  capsule TAKE 1 CAPSULE THREE TIMES DAILY  FOR  NEUROPATHY  PAIN 270 capsule 1  . Lancet Devices (ADJUSTABLE LANCING DEVICE) MISC     . latanoprost (XALATAN) 0.005 % ophthalmic solution Place 1 drop into both eyes daily.     Marland Kitchen levothyroxine (SYNTHROID) 100 MCG tablet Take 1 tablet daily on an empty stomach with only water for 30 minutes & no Antacid meds, Calcium or Magnesium for 4 hours & avoid Biotin 90 tablet 3  . Magnesium 500 MG CAPS Take 2 capsules by mouth daily.     . Omega-3 Fatty Acids (OMEGA 3 500 PO) Take 1 capsule by mouth daily.    . rosuvastatin (CRESTOR) 5 MG tablet Please take 1 tab by mouth three nights (MWF) a week for cholesterol. 90 tablet 1  . timolol (TIMOPTIC) 0.5 % ophthalmic solution Place 1 drop into both eyes 2 (two) times daily.      No current facility-administered medications  on file prior to visit.    Current Problems (verified) Patient Active Problem List   Diagnosis Date Noted  . Diet-controlled diabetes mellitus ([place]) 06/19/2018  . Insomnia 10/26/2017  . Diabetic peripheral neuropathy ([place]) 01/18/2015  . CKD stage 2 due to type 2 diabetes mellitus ([place]) 01/18/2015  . Morbid obesity ([place]) BMI 35 associated with htn, hyperlipidemia, arthritis, T2DM 07/03/2014  . Vitamin D deficiency 03/31/2013  . Medication management 03/31/2013  . DJD   . Glaucoma   . Hypothyroidism   . Hypertension   . Hyperlipidemia associated with type 2 diabetes mellitus ([place])   . GERD (gastroesophageal reflux disease)   . Tear of lateral meniscus of knee, current 03/27/2011    Screening Tests Immunization History  Administered Date(s) Administered  . Influenza, High Dose Seasonal PF 11/24/2017, 10/17/2018  . Influenza-Unspecified 12/09/2013, 11/14/2015, 11/23/2016  . Pneumococcal Conjugate-13 04/21/2015  . Pneumococcal Polysaccharide-23 05/29/2008  . Td 04/26/2007    Preventative care: Last colonoscopy: Dr. Fuller Plan on 11/2017 Last mammogram: 08/2018 Last pap smear/pelvic  exam: remote, declines DEXA: 2017, normal, declines today, will get next year *** US carotid 2017  Prior vaccinations: TD or Tdap: 2009, declines today, will call insurance  Influenza: 09/2018 Pneumococcal: 2010 Prevnar 13: 2017 Shingles/Zostavax: declines due to price  Names of Other Physician/Practitioners you currently use: 1. [facility]- here for primary care 2. Dr. Peter Garter, eye doctor,has appointment coming up, visits every 3-4 months for glacoma, 2019, abstracted for 04/13/2017 diabetic eye exam *** 3. Can't remember name, dentist, last visit 2017  Patient Care Team: Unk Pinto, MD as PCP - General (Internal Medicine) Camillo Flaming, [place] as Referring Physician (Optometry) Suella Broad, MD as Consulting Physician (Physical Medicine and Rehabilitation) Ladene Artist, MD as Consulting Physician (Gastroenterology) Myrlene Broker, MD as Attending Physician (Urology)   Allergies Allergies  Allergen Reactions  . Meloxicam Swelling    Weight gain    SURGICAL HISTORY She  has a past surgical history that includes Abdominal hysterectomy; Appendectomy; Joint replacement (2007); Bladder suspension; Knee arthroscopy (03/28/2011); and Colonoscopy. FAMILY HISTORY Her family history includes Colon polyps in her sister; Diabetes in her sister; Esophageal cancer in some other family members. SOCIAL HISTORY She  reports that she quit smoking about 23 years ago. She has never used smokeless tobacco. She reports current alcohol use of about 2.0 standard drinks of alcohol per week. She reports that she does not use drugs.  MEDICARE WELLNESS OBJECTIVES: Physical activity:   Cardiac risk factors:   Depression/mood screen:   Depression screen [facility] 2/9 06/20/2018  Decreased Interest 0  Down, Depressed, Hopeless 0  PHQ - 2 Score 0    ADLs:  No flowsheet data found.   Cognitive Testing  Alert? Yes  Normal Appearance?Yes  Oriented to person?  Yes  Place? Yes   Time? Yes  Recall of three objects?  Yes  Can perform simple calculations? Yes  Displays appropriate judgment?Yes  Can read the correct time from a watch face?Yes  EOL planning:     Objective:   There were no vitals taken for this visit. There is no height or weight on file to calculate BMI.  General appearance: alert, no distress, WD/WN,  female HEENT: normocephalic, sclerae anicteric, TMs pearly, nares patent, no discharge or erythema, pharynx normal Oral cavity: MMM, no lesions Neck: supple, no lymphadenopathy, no thyromegaly, no masses Heart: RRR, normal S1, S2, no murmurs Lungs: CTA bilaterally, no wheezes, rhonchi, or rales Abdomen: +bs, soft,  non tender, non distended, no masses, no hepatomegaly, no splenomegaly Musculoskeletal: nontender, mild effusion and bony enlargement to right knee Extremities: no edema, no cyanosis, no clubbing Pulses: 2+ symmetric, upper and lower extremities, normal cap refill Neurological: alert, oriented x 3, CN2-12 intact, strength normal upper extremities and lower extremities, sensation normal throughout, DTRs 2+ throughout, no cerebellar signs, gait mildly antalgic Psychiatric: normal affect, behavior normal, pleasant  Breast: defer Gyn: defer Rectal: defer  Medicare Attestation I have personally reviewed: The patient's medical and social history Their use of alcohol, tobacco or illicit drugs Their current medications and supplements The patient's functional ability including ADLs,fall risks, home safety risks, cognitive, and hearing and visual impairment Diet and physical activities Evidence for depression or mood disorders  The patient's weight, height, BMI, and visual acuity have been recorded in the chart.  I have made referrals, counseling, and provided education to the patient based on review of the above and I have provided the patient with a written personalized care plan for preventive services.     Izora Ribas, NP   07/01/2019

## 2019-07-02 ENCOUNTER — Ambulatory Visit: Payer: Medicare HMO | Admitting: Adult Health

## 2019-07-15 ENCOUNTER — Telehealth: Payer: Self-pay

## 2019-07-15 NOTE — Telephone Encounter (Signed)
Per patient, states that Dr. Melford Aase told her to call when she needed a refill on the Gabapentin. New rx should be increased from 300mg  to 600mg . Please advise.

## 2019-07-15 NOTE — Telephone Encounter (Signed)
Patient states that she has been taking Gabapentin, 300mg  BID and 600mg  QHS. Please advise.

## 2019-07-16 ENCOUNTER — Other Ambulatory Visit: Payer: Self-pay

## 2019-07-16 MED ORDER — GABAPENTIN 300 MG PO CAPS: ORAL_CAPSULE | ORAL | 1 refills | Status: DC

## 2019-07-16 NOTE — Telephone Encounter (Signed)
Patient agrees to try the 300mg , 4 x a day. Rx sent to mail order pharmacy

## 2019-07-16 NOTE — Telephone Encounter (Signed)
Left message to call back  

## 2019-07-18 ENCOUNTER — Ambulatory Visit (INDEPENDENT_AMBULATORY_CARE_PROVIDER_SITE_OTHER): Payer: Medicare HMO | Admitting: Adult Health

## 2019-07-18 ENCOUNTER — Encounter: Payer: Self-pay | Admitting: Adult Health

## 2019-07-18 ENCOUNTER — Other Ambulatory Visit: Payer: Self-pay

## 2019-07-18 VITALS — BP 112/72 | HR 54 | Temp 97.7°F | Ht 67.0 in | Wt 226.0 lb

## 2019-07-18 DIAGNOSIS — E1142 Type 2 diabetes mellitus with diabetic polyneuropathy: Secondary | ICD-10-CM | POA: Diagnosis not present

## 2019-07-18 DIAGNOSIS — K219 Gastro-esophageal reflux disease without esophagitis: Secondary | ICD-10-CM | POA: Diagnosis not present

## 2019-07-18 DIAGNOSIS — E039 Hypothyroidism, unspecified: Secondary | ICD-10-CM | POA: Diagnosis not present

## 2019-07-18 DIAGNOSIS — G4700 Insomnia, unspecified: Secondary | ICD-10-CM

## 2019-07-18 DIAGNOSIS — E1169 Type 2 diabetes mellitus with other specified complication: Secondary | ICD-10-CM | POA: Diagnosis not present

## 2019-07-18 DIAGNOSIS — E119 Type 2 diabetes mellitus without complications: Secondary | ICD-10-CM

## 2019-07-18 DIAGNOSIS — Z0001 Encounter for general adult medical examination with abnormal findings: Secondary | ICD-10-CM | POA: Diagnosis not present

## 2019-07-18 DIAGNOSIS — E1122 Type 2 diabetes mellitus with diabetic chronic kidney disease: Secondary | ICD-10-CM

## 2019-07-18 DIAGNOSIS — E785 Hyperlipidemia, unspecified: Secondary | ICD-10-CM | POA: Diagnosis not present

## 2019-07-18 DIAGNOSIS — N182 Chronic kidney disease, stage 2 (mild): Secondary | ICD-10-CM

## 2019-07-18 DIAGNOSIS — E559 Vitamin D deficiency, unspecified: Secondary | ICD-10-CM | POA: Diagnosis not present

## 2019-07-18 DIAGNOSIS — I10 Essential (primary) hypertension: Secondary | ICD-10-CM | POA: Diagnosis not present

## 2019-07-18 DIAGNOSIS — H409 Unspecified glaucoma: Secondary | ICD-10-CM

## 2019-07-18 DIAGNOSIS — R6889 Other general symptoms and signs: Secondary | ICD-10-CM

## 2019-07-18 DIAGNOSIS — M1990 Unspecified osteoarthritis, unspecified site: Secondary | ICD-10-CM

## 2019-07-18 DIAGNOSIS — Z79899 Other long term (current) drug therapy: Secondary | ICD-10-CM

## 2019-07-18 DIAGNOSIS — Z0000 Encounter for general adult medical examination without abnormal findings: Secondary | ICD-10-CM

## 2019-07-18 MED ORDER — OMEPRAZOLE 20 MG PO CPDR: DELAYED_RELEASE_CAPSULE | ORAL | 0 refills | Status: DC

## 2019-07-18 NOTE — Patient Instructions (Addendum)
Rebecca Graves , Thank you for taking time to come for your Medicare Wellness Visit. I appreciate your ongoing commitment to your health goals. Please review the following plan we discussed and let me know if I can assist you in the future.   These are the goals we discussed: Goals    . Exercise 3x per week     . HEMOGLOBIN A1C < 5.7    . LDL CALC < 70    . Weight (lb) < 210 lb (95.3 kg)       This is a list of the screening recommended for you and due dates:  Health Maintenance  Topic Date Due  . Tetanus Vaccine  04/25/2017  . Eye exam for diabetics  04/13/2018  . Mammogram  09/10/2019  . Flu Shot  09/29/2019  . Hemoglobin A1C  10/02/2019  . Complete foot exam   04/02/2020  . DEXA scan (bone density measurement)  Completed  . COVID-19 Vaccine  Completed  . Pneumonia vaccines  Completed      Can try adding etroven complete supplement  -OR black cohosh or evening primrose supplements for hot flashes   Try omeprazole twice a day for 2 weeks, then try cutting back to once daily prior to breakfast or dinner/bedtime for 8-12 weeks, then will try to taper to pepcid/famotidine    Omeprazole; Sodium Bicarbonate oral capsule What is this medicine? OMEPRAZOLE; SODIUM BICARBONATE (oh ME pray zol; SOE dee um bye KAR bon ate) prevents the production of acid in the stomach. It is used to treat gastroesophageal reflux disease (GERD), ulcers, and inflammation of the esophagus. This medicine may be used for other purposes; ask your health care provider or pharmacist if you have questions. COMMON BRAND NAME(S): OmePPi, Zegerid What should I tell my health care provider before I take this medicine? They need to know if you have any of these conditions:  Bartter's syndrome  diet low in salt  heart failure  history of low levels of calcium, magnesium, or potassium in the blood  kidney disease  lupus  problems with acid-base balance in your body  an unusual reaction to omeprazole,  sodium bicarbonate, other medicines, foods, dyes, or preservatives  pregnant or trying to get pregnant  breast-feeding How should I use this medicine? Take this medicine by mouth with a glass of water. Do not take with any other liquids. Follow the directions on the prescription label. Do not cut, crush or chew this medicine. Swallow the capsules whole. Take this medicine on an empty stomach, at least 1 hour before a meal. Take your medicine at regular intervals. Do not take your medicine more often than directed. Do not stop taking except on your doctor's advice. A special MedGuide will be given to you by the pharmacist with each prescription and refill. Be sure to read this information carefully each time. Talk to your pediatrician regarding the use of this medicine in children. Special care may be needed. Overdosage: If you think you have taken too much of this medicine contact a poison control center or emergency room at once. NOTE: This medicine is only for you. Do not share this medicine with others. What if I miss a dose? If you miss a dose, take it as soon as you can. If it is almost time for your next dose, take only that dose. Do not take double or extra doses. What may interact with this medicine? Do not take this medicine with any of the following medications:  atazanavir  clopidogrel  nelfinavir  rilpivirine This medicine may also interact with the following medications:  antifungals like itraconazole, ketoconazole, and voriconazole  certain antivirals for HIV or hepatitis  certain medicines that treat or prevent blood clots like warfarin  cilostazol  citalopram  cyclosporine  dasatinib  digoxin  disfulfiram  diuretics  erlotinib  iron supplements  medicines for anxiety, panic, and sleep like diazepam  medicines for seizures like carbamazepine, phenobarbital, phenytoin  methotrexate  mycophenolate mofetil  nilotinib  rifampin  St. John's  wort  tacrolimus  vitamin B12 This list may not describe all possible interactions. Give your health care provider a list of all the medicines, herbs, non-prescription drugs, or dietary supplements you use. Also tell them if you smoke, drink alcohol, or use illegal drugs. Some items may interact with your medicine. What should I watch for while using this medicine? Visit your healthcare professional for regular checks on your progress. Tell your healthcare professional if your symptoms do not start to get better or if they get worse. You may need blood work done while taking this medicine. This medicine may cause a decrease in vitamin B12. You should make sure that you get enough vitamin B12 while you are taking this medicine. Discuss the foods you eat and the vitamins you take with your health care professional. What side effects may I notice from receiving this medicine? Side effects that you should report to your doctor or health care professional as soon as possible:  allergic reactions like skin rash, itching or hives, swelling of the face, lips, or tongue  bone pain  breathing problems  fever or sore throat  joint pain  rash on cheeks or arms that gets worse in the sun  redness, blistering, peeling, or loosening of the skin, including inside the mouth  severe diarrhea  signs and symptoms of kidney injury like trouble passing urine or change in the amount of urine  signs and symptoms of low magnesium like muscle cramps; muscle pain; muscle weakness; tremors; seizures; or fast, irregular heartbeat  swelling of the ankles, legs  stomach polyps  unusual bleeding or bruising Side effects that usually do not require medical attention (report to your doctor or health care professional if they continue or are bothersome):  diarrhea  dry mouth  gas  headache  nausea  stomach pain This list may not describe all possible side effects. Call your doctor for medical advice  about side effects. You may report side effects to FDA at 1-800-FDA-1088. Where should I keep my medicine? Keep out of the reach of children. Store at room temperature between 15 and 30 degrees C (59 and 86 degrees F). Protect from moisture. Throw away any unused medicine after the expiration date. NOTE: This sheet is a summary. It may not cover all possible information. If you have questions about this medicine, talk to your doctor, pharmacist, or health care provider.  2020 Elsevier/Gold Standard (2017-12-06 13:41:35)

## 2019-07-18 NOTE — Progress Notes (Signed)
MEDICARE ANNUAL WELLNESS VISIT AND FOLLOW UP  Assessment:   Encounter for Medicare annual wellness exam Diabetes eye report requested from Dr. Peter Garter Patient will schedule mammogram in July once receives letter to schedule  Essential hypertension - continue medications, DASH diet, exercise and monitor at home. Call if greater than 130/80. - declines switch to ACE/ARB at this time  -     CBC with Differential/Platelet -     CMP/GFR -     TSH  Gastroesophageal reflux disease, esophagitis presence not specified Off of medication; not doing well - sent in omeprazole 20 mg to start BID x 2 weeks, then try tapering down to once daily for 8-12 weeks.  Discussed diet, avoiding triggers and other lifestyle changes  Diet controlled T2DM [facility])  Education: Reviewed 'ABCs' of diabetes management (respective goals in parentheses):  A1C (<7), blood pressure (<130/80), and cholesterol (LDL <70) Eye Exam yearly and Dental Exam every 6 months.  Dietary recommendations Physical Activity recommendations -     Hemoglobin A1c  CKD II associated with T2DM (HCC) Increase fluids, avoid NSAIDS, monitor sugars, will monitor  Hypothyroidism, unspecified type -check TSH level, continue medications the same, reminded to take on an empty stomach 30-21mins before food.  -     TSH  Peripheral sensory neuropathy, diabetic (HCC) Control glucose, do daily food checks -     gabapentin (NEURONTIN) 300 MG capsule; TAKE 1-2 CAPSULES TID PRN DAILY  FOR  NEUROPATHY  PAIN  Osteoarthritis, unspecified osteoarthritis type, unspecified site Followed by Dr. Mayer Camel, pending surgery   Hyperlipidemia associated with T2DM ([place]) -continue statin for LDL goal <70 check lipids, decrease fatty foods, increase activity.   Morbid obesity - BMI 35 with diabetes, htn, hyperlipidemia - long discussion about weight loss, diet, and exercise -recommended diet heavy in fruits and veggies and low in animal meats, cheeses, and dairy  products - restart swimming regularly after knee surgery and covid 19 - try eating dinner earlier, eat off of salad plate, wait 10-15 min prior to getting seconds - weight loss recommended prior to knee surgery  Vitamin D deficiency Continue supplement  Medication management -     Magnesium  Glaucoma, unspecified glaucoma type, unspecified laterality Continue eye doctor follow up  Insomnia  Insomnia- good sleep hygiene discussed, increase day time activity, doing well with occasional xanax, PDMP reviewed   Hot flashes Some improvement with gabapentin; try estroven complete, or black cohosh/evening primrose Weight loss encouraged   Future Appointments  Date Time Provider [place]  10/08/2019  2:30 PM Unk Pinto, MD [facility] None     Plan:   During the course of the visit the patient was educated and counseled about appropriate screening and preventive services including:    Pneumococcal vaccine   Influenza vaccine  Td vaccine  Screening electrocardiogram  Screening mammography  Bone densitometry screening  Colorectal cancer screening  Diabetes screening  Glaucoma screening  Nutrition counseling   Advanced directives: given info/requested   Subjective:   Rebecca Graves is a 79 y.o. female who presents for Medicare Annual Wellness Visit and 3 month follow up on hypertension, T2 diabetes with CKD and neuropathy, hyperlipidemia, vitamin D def.   She is established with Dr. Mayer Camel for right knee arthritis, discussing surgery but was postponed due to covid 19. She reports is managing with PRN tylenol and topicals and managing fairly.   S/p hysterectomy in 1980s. She recently tapered off of estrace which she was on for many years. Had normal MGM  in 08/2018. Gabapentin has been helpful for hot flashes but getting worse now that weather is warming up.   She has dx of insomnia and is prescribed xanax, reports taking 0.5 mg approx twice a week, last  refill 30 tabs 03/26/2019.   GERD- not currently prescribed medication, reports daily symptoms, burning and bloating with any food, even water, currently taking baking soda only.   BMI is Body mass index is 35.4 kg/m., she has not been working on diet, stopped swimming after her niece passed away who she was swimming with. She admits she eats dinner too late, too much.  Wt Readings from Last 3 Encounters:  07/18/19 226 lb (102.5 kg)  04/04/19 228 lb 3.2 oz (103.5 kg)  12/31/18 225 lb (102.1 kg)   Her blood pressure has been controlled at home, today their BP is BP: 112/72 She does workout. She denies chest pain, shortness of breath, dizziness.   She is on cholesterol medication (on rosuvastatin 5 mg daily) and denies myalgias. Her cholesterol is at goal. The cholesterol last visit was:   Lab Results  Component Value Date   CHOL 153 04/04/2019   HDL 76 04/04/2019   LDLCALC 59 04/04/2019   TRIG 96 04/04/2019   CHOLHDL 2.0 04/04/2019   She has been working on diet and exercise for Diet controlled T2DM with CKD 2 and neuropathy.  She has cut out soda, switched to diet tea, she is on ASA and she is not on ACE - declines at this time  Denies increased appetite, nausea, polydipsia, polyuria and vomiting.  She does have neuropathy, does well during the day, using gabapentin at night.  She checks occasional fasting sugars, typically 100-120.  Last A1C in the office was:  Lab Results  Component Value Date   HGBA1C 6.6 (H) 04/04/2019   She has been pushing fluid intake:   Lab Results  Component Value Date   GFRAA 65 04/04/2019   Patient is on Vitamin D supplement and at goal at recent check:  Lab Results  Component Value Date   VD25OH 68 04/04/2019     She is on thyroid medication. Her medication was not changed last visit, on 1/2  pill on F and 1 pill the other days. Patient denies nervousness, palpitations and weight changes.  Lab Results  Component Value Date   TSH 4.08  04/04/2019     Medication Review Current Outpatient Medications on File Prior to Visit  Medication Sig Dispense Refill  . ACCU-CHEK AVIVA PLUS test strip     . ALPRAZolam (XANAX) 1 MG tablet Take 1/2-1 tablet 1 to 2 /day ONLY if needed for sleep or anxiety & limit to 5 days /week to avoid Addiction or Dementia 30 tablet 0  . aspirin 81 MG tablet Take 81 mg by mouth daily. Takes two tablets daily    . ASSURE COMFORT LANCETS 30G MISC     . bisoprolol-hydrochlorothiazide (ZIAC) 10-6.25 MG tablet Take 1 tablet Daily for BP 90 tablet 1  . Blood Glucose Calibration (ACCU-CHEK AVIVA) SOLN     . Cholecalciferol (VITAMIN D-3) 5000 UNITS TABS Take 1 tablet by mouth daily.    . furosemide (LASIX) 40 MG tablet Take 1 tablet Daily for Fluid Retention / Ankle Swelling 90 tablet 1  . gabapentin (NEURONTIN) 300 MG capsule TAKE 1 CAPSULE FOUR TIMES DAILY  FOR  NEUROPATHY  PAIN 360 capsule 1  . Lancet Devices (ADJUSTABLE LANCING DEVICE) MISC     . latanoprost (XALATAN) 0.005 %  ophthalmic solution Place 1 drop into both eyes daily.     Marland Kitchen levothyroxine (SYNTHROID) 100 MCG tablet Take 1 tablet daily on an empty stomach with only water for 30 minutes & no Antacid meds, Calcium or Magnesium for 4 hours & avoid Biotin 90 tablet 3  . Magnesium 500 MG CAPS Take 2 capsules by mouth daily.     . Omega-3 Fatty Acids (OMEGA 3 500 PO) Take 1 capsule by mouth daily.    . rosuvastatin (CRESTOR) 5 MG tablet Please take 1 tab by mouth three nights (MWF) a week for cholesterol. 90 tablet 1  . timolol (TIMOPTIC) 0.5 % ophthalmic solution Place 1 drop into both eyes 2 (two) times daily.      No current facility-administered medications on file prior to visit.    Current Problems (verified) Patient Active Problem List   Diagnosis Date Noted  . Diet-controlled diabetes mellitus ([place]) 06/19/2018  . Insomnia 10/26/2017  . Diabetic peripheral neuropathy ([place]) 01/18/2015  . CKD stage 2 due to type 2 diabetes mellitus ([place])  01/18/2015  . Morbid obesity ([place]) BMI 35 associated with htn, hyperlipidemia, arthritis, T2DM 07/03/2014  . Vitamin D deficiency 03/31/2013  . Medication management 03/31/2013  . DJD   . Glaucoma   . Hypothyroidism   . Hypertension   . Hyperlipidemia associated with type 2 diabetes mellitus ([place])   . GERD (gastroesophageal reflux disease)   . Tear of lateral meniscus of knee, current 03/27/2011    Screening Tests Immunization History  Administered Date(s) Administered  . Influenza, High Dose Seasonal PF 11/24/2017, 10/17/2018  . Influenza-Unspecified 12/09/2013, 11/14/2015, 11/23/2016  . PFIZER SARS-COV-2 Vaccination 05/12/2019, 06/01/2019  . Pneumococcal Conjugate-13 04/21/2015  . Pneumococcal Polysaccharide-23 05/29/2008  . Td 04/26/2007    Preventative care: Last colonoscopy: Dr. Fuller Plan on 11/2017 Last mammogram: 08/2018, patient will schedule once she receives a letter Last pap smear/pelvic exam: remote, declines DEXA: 2017, normal, declines today, will get next year US carotid 2017  Prior vaccinations: TD or Tdap: 2009, declines today, will call insurance  Influenza: 09/2018 Pneumococcal: 2010 Prevnar 13: 2017 Shingles/Zostavax: declines due to price Covid 19: 2/2, 2021, pfizer  Names of Other Physician/Practitioners you currently use: 1. [facility]- here for primary care 2. Dr. Peter Garter, eye doctor,has appointment coming up, visits every 3-4 months for glacoma, 2019, abstracted for 04/13/2017 diabetic eye exam, updated report requested today  3. Can't remember name, dentist, last visit 2017, has partials, encouraged to schedule follow up  Patient Care Team: Unk Pinto, MD as PCP - General (Internal Medicine) Camillo Flaming, [place] as Referring Physician (Optometry) Suella Broad, MD as Consulting Physician (Physical Medicine and Rehabilitation) Ladene Artist, MD as Consulting Physician (Gastroenterology) Myrlene Broker,  MD as Attending Physician (Urology)   Allergies Allergies  Allergen Reactions  . Meloxicam Swelling    Weight gain    SURGICAL HISTORY She  has a past surgical history that includes Abdominal hysterectomy; Appendectomy; Joint replacement (2007); Bladder suspension; Knee arthroscopy (03/28/2011); and Colonoscopy. FAMILY HISTORY Her family history includes Colon polyps in her sister; Diabetes in her sister; Esophageal cancer in some other family members. SOCIAL HISTORY She  reports that she quit smoking about 23 years ago. She has never used smokeless tobacco. She reports current alcohol use of about 2.0 standard drinks of alcohol per week. She reports that she does not use drugs.  MEDICARE WELLNESS OBJECTIVES: Physical activity: Current Exercise Habits: The patient does not participate in regular exercise  at present, Exercise limited by: orthopedic condition(s) Cardiac risk factors: Cardiac Risk Factors include: advanced age (>20men, >44 women);dyslipidemia;hypertension;diabetes mellitus;obesity (BMI >30kg/m2);sedentary lifestyle Depression/mood screen:   Depression screen [facility] 2/9 07/18/2019  Decreased Interest 0  Down, Depressed, Hopeless 0  PHQ - 2 Score 0    ADLs:  In your present state of health, do you have any difficulty performing the following activities: 07/18/2019  Hearing? N  Vision? N  Difficulty concentrating or making decisions? N  Walking or climbing stairs? Y  Comment R knee pending surgery, no stairs in home, manages slowly  Dressing or bathing? N  Doing errands, shopping? N  Some recent data might be hidden     Cognitive Testing  Alert? Yes  Normal Appearance?Yes  Oriented to person? Yes  Place? Yes   Time? Yes  Recall of three objects?  Yes  Can perform simple calculations? Yes  Displays appropriate judgment?Yes  Can read the correct time from a watch face?Yes  EOL planning: Does Patient Have a Medical Advance Directive?: Yes Type of Advance Directive:  Healthcare Power of Attorney, Living will Does patient want to make changes to medical advance directive?: No - Patient declined Copy of [place] in Chart?: No - copy requested   Objective:   Blood pressure 112/72, pulse (!) 54, temperature 97.7 F (36.5 C), height 5\' 7"  (1.702 m), weight 226 lb (102.5 kg), SpO2 97 %. Body mass index is 35.4 kg/m.  General appearance: alert, no distress, WD/WN,  female HEENT: normocephalic, sclerae anicteric, TMs pearly, nares patent, no discharge or erythema, pharynx normal Oral cavity: MMM, no lesions Neck: supple, no lymphadenopathy, no thyromegaly, no masses Heart: RRR, normal S1, S2, no murmurs Lungs: CTA bilaterally, no wheezes, rhonchi, or rales Abdomen: +bs, soft, non tender, non distended, no masses, no hepatomegaly, no splenomegaly Musculoskeletal: nontender, mild effusion and bony enlargement to right knee Extremities: no edema, no cyanosis, no clubbing Pulses: 2+ symmetric, upper and lower extremities, normal cap refill Neurological: alert, oriented x 3, CN2-12 intact, strength normal upper extremities and lower extremities, sensation normal throughout, DTRs 2+ throughout, no cerebellar signs, gait mildly antalgic Psychiatric: normal affect, behavior normal, pleasant  Breast: defer Gyn: defer Rectal: defer  Medicare Attestation I have personally reviewed: The patient's medical and social history Their use of alcohol, tobacco or illicit drugs Their current medications and supplements The patient's functional ability including ADLs,fall risks, home safety risks, cognitive, and hearing and visual impairment Diet and physical activities Evidence for depression or mood disorders  The patient's weight, height, BMI, and visual acuity have been recorded in the chart.  I have made referrals, counseling, and provided education to the patient based on review of the above and I have provided the patient with a written  personalized care plan for preventive services.     Rebecca Ribas, NP   07/18/2019

## 2019-07-19 LAB — CBC WITH DIFFERENTIAL/PLATELET
Absolute Monocytes: 492 cells/uL (ref 200–950)
Basophils Absolute: 41 cells/uL (ref 0–200)
Basophils Relative: 1 %
Eosinophils Absolute: 139 cells/uL (ref 15–500)
Eosinophils Relative: 3.4 %
HCT: 41.4 % (ref 35.0–45.0)
Hemoglobin: 14 g/dL (ref 11.7–15.5)
Lymphs Abs: 2226 cells/uL (ref 850–3900)
MCH: 28.5 pg (ref 27.0–33.0)
MCHC: 33.8 g/dL (ref 32.0–36.0)
MCV: 84.3 fL (ref 80.0–100.0)
MPV: 9.8 fL (ref 7.5–12.5)
Monocytes Relative: 12 %
Neutro Abs: 1201 cells/uL — ABNORMAL LOW (ref 1500–7800)
Neutrophils Relative %: 29.3 %
Platelets: 231 10*3/uL (ref 140–400)
RBC: 4.91 10*6/uL (ref 3.80–5.10)
RDW: 13.6 % (ref 11.0–15.0)
Total Lymphocyte: 54.3 %
WBC: 4.1 10*3/uL (ref 3.8–10.8)

## 2019-07-19 LAB — COMPLETE METABOLIC PANEL WITH GFR
AG Ratio: 1.7 (calc) (ref 1.0–2.5)
ALT: 13 U/L (ref 6–29)
AST: 13 U/L (ref 10–35)
Albumin: 4.3 g/dL (ref 3.6–5.1)
Alkaline phosphatase (APISO): 63 U/L (ref 37–153)
BUN: 14 mg/dL (ref 7–25)
CO2: 30 mmol/L (ref 20–32)
Calcium: 10.5 mg/dL — ABNORMAL HIGH (ref 8.6–10.4)
Chloride: 102 mmol/L (ref 98–110)
Creat: 0.84 mg/dL (ref 0.60–0.93)
GFR, Est African American: 77 mL/min/{1.73_m2} (ref >=60)
GFR, Est Non African American: 67 mL/min/{1.73_m2} (ref >=60)
Globulin: 2.5 g/dL (calc) (ref 1.9–3.7)
Glucose, Bld: 96 mg/dL (ref 65–99)
Potassium: 4 mmol/L (ref 3.5–5.3)
Sodium: 141 mmol/L (ref 135–146)
Total Bilirubin: 0.7 mg/dL (ref 0.2–1.2)
Total Protein: 6.8 g/dL (ref 6.1–8.1)

## 2019-07-19 LAB — LIPID PANEL
Cholesterol: 156 mg/dL (ref <200)
HDL: 70 mg/dL (ref >=50)
LDL Cholesterol (Calc): 67 mg/dL (calc)
Non-HDL Cholesterol (Calc): 86 mg/dL (calc) (ref <130)
Total CHOL/HDL Ratio: 2.2 (calc) (ref <5.0)
Triglycerides: 108 mg/dL (ref <150)

## 2019-07-19 LAB — HEMOGLOBIN A1C
Hgb A1c MFr Bld: 6.4 % of total Hgb — ABNORMAL HIGH (ref <5.7)
Mean Plasma Glucose: 137 (calc)
eAG (mmol/L): 7.6 (calc)

## 2019-07-19 LAB — MAGNESIUM: Magnesium: 2.1 mg/dL (ref 1.5–2.5)

## 2019-07-19 LAB — TSH: TSH: 1.96 mIU/L (ref 0.40–4.50)

## 2019-07-19 NOTE — Progress Notes (Signed)
--  Patient is aware of lab results. -E. Welch

## 2019-07-30 ENCOUNTER — Telehealth: Payer: Self-pay

## 2019-07-30 NOTE — Telephone Encounter (Signed)
Patient states that she took her acid reflux medication on Wednesday after she ate which worked well. Took another tablet on Thursday since it was working. And Friday when she woke up, she was swelling so she didn't take anymore. Legs and hands feel numb. Please advise.

## 2019-07-31 NOTE — Telephone Encounter (Signed)
Patient is scheduled to come in for an office visit.

## 2019-07-31 NOTE — Progress Notes (Signed)
Assessment and Plan:  Rebecca Graves was seen today for foot swelling.  Diagnoses and all orders for this visit:  Swelling Non-specific, has resolved, no red flags from HPI, doubt this was med reaction which was her initial concern, hasn't recurred since restarting omeprazole or gabapentin, discussed would be atypical reaction Normal exam today, she believes edema r/t atypical long hours working in garden  Defer on any labs as sx resolved Increase fluids, avoid long periods of squatting, get up and walk, do ankle exercises, compression hose if needed, elevate extremities TID IF recurrent return and may check some labs  Insomnia Also xanax refill request today; PDMP reviewed; avoid taking daily -     ALPRAZolam (XANAX) 1 MG tablet; Take 1/2-1 tablet 1 to 2 /day ONLY if needed for sleep or anxiety & limit to 5 days /week to avoid Addiction or Dementia  Further disposition pending results of labs. Discussed med's effects and SE's.   Over 15 minutes of exam, counseling, chart review, and critical decision making was performed.   Future Appointments  Date Time Provider [place]  10/31/2019  2:30 PM Unk Pinto, MD [facility] None    ------------------------------------------------------------------------------------------------------------------   HPI BP 116/64   Pulse (!) 55   Temp (!) 97.3 F (36.3 C)   Ht 5\' 7"  (1.702 m)   Wt 226 lb 12.8 oz (102.9 kg)   SpO2 96%   BMI 35.52 kg/m   79 y.o.femalewith T2DM with peripheral neuropathy, treated by lasix for htn and fluid recention presents for evaluation due to reports of increased edema and tingling of   She reports today her symptoms are essentially resolved. She reports last week she had worked in her garden for 3 days (weeding by hand), Thursday and Friday was noting stiffness in hands/arms/ankles/feet, had some mild numb/tingling sensation of palms and feet, mild sharp shooting pains. She stopped working in yard, used  biofreeze, increased furosemide from once daily to 2 tabs/day for 3 days, noted improvement by 3rd day, essentially resolved today.   Initially thought was r/t new medication omeprazole or gabapentin (? New manufacturer- color changed) and was holding but resumed taking yesterday and hasn't had recurrent problem.   Denies dyspnea on exertion, orthopnea and paroxysmal nocturnal dyspnea. Positive for none. Wt Readings from Last 3 Encounters:  08/01/19 226 lb 12.8 oz (102.9 kg)  07/18/19 226 lb (102.5 kg)  04/04/19 228 lb 3.2 oz (103.5 kg)   Her blood pressure has been controlled at home, today their BP is BP: 116/64  She does not workout. She denies chest pain, shortness of breath, dizziness.   She has been working on diet and exercise for lifestyle controlled T2 diabetes, and denies hyperglycemia, hypoglycemia , increased appetite, nausea, polydipsia, polyuria and visual disturbances. Last A1C in the office was:  Lab Results  Component Value Date   HGBA1C 6.4 (H) 07/18/2019     Past Medical History:  Diagnosis Date  . Anal fissure   . Arthritis   . Carotid stenosis, <50%, bilateral  12/20/2018  . Colon polyps    hyperplastic  . Diverticulosis   . GERD (gastroesophageal reflux disease)   . Glaucoma   . Hemorrhoids   . Hyperlipidemia   . Hypertension   . Hypothyroidism   . Prediabetes      Allergies  Allergen Reactions  . Meloxicam Swelling    Weight gain    Current Outpatient Medications on File Prior to Visit  Medication Sig  . ACCU-CHEK AVIVA PLUS test strip   .  ALPRAZolam (XANAX) 1 MG tablet Take 1/2-1 tablet 1 to 2 /day ONLY if needed for sleep or anxiety & limit to 5 days /week to avoid Addiction or Dementia  . aspirin 81 MG tablet Take 81 mg by mouth daily. Takes two tablets daily  . ASSURE COMFORT LANCETS 30G MISC   . bisoprolol-hydrochlorothiazide (ZIAC) 10-6.25 MG tablet Take 1 tablet Daily for BP  . Blood Glucose Calibration (ACCU-CHEK AVIVA) SOLN   .  Cholecalciferol (VITAMIN D-3) 5000 UNITS TABS Take 1 tablet by mouth daily.  . furosemide (LASIX) 40 MG tablet Take 1 tablet Daily for Fluid Retention / Ankle Swelling  . gabapentin (NEURONTIN) 300 MG capsule TAKE 1 CAPSULE FOUR TIMES DAILY  FOR  NEUROPATHY  PAIN  . Lancet Devices (ADJUSTABLE LANCING DEVICE) MISC   . latanoprost (XALATAN) 0.005 % ophthalmic solution Place 1 drop into both eyes daily.   Marland Kitchen levothyroxine (SYNTHROID) 100 MCG tablet Take 1 tablet daily on an empty stomach with only water for 30 minutes & no Antacid meds, Calcium or Magnesium for 4 hours & avoid Biotin  . Magnesium 500 MG CAPS Take 2 capsules by mouth daily.   . Omega-3 Fatty Acids (OMEGA 3 500 PO) Take 1 capsule by mouth daily.  Marland Kitchen omeprazole (PRILOSEC) 20 MG capsule Take 1 cap twice a day prior to meals as needed for acid reflux.  . rosuvastatin (CRESTOR) 5 MG tablet Please take 1 tab by mouth three nights (MWF) a week for cholesterol.  . timolol (TIMOPTIC) 0.5 % ophthalmic solution Place 1 drop into both eyes 2 (two) times daily.    No current facility-administered medications on file prior to visit.    ROS: all negative except above.   Physical Exam:  BP 116/64   Pulse (!) 55   Temp (!) 97.3 F (36.3 C)   Ht 5\' 7"  (1.702 m)   Wt 226 lb 12.8 oz (102.9 kg)   SpO2 96%   BMI 35.52 kg/m   General Appearance: Well nourished, in no apparent distress. Eyes: PERRLA, EOMs, conjunctiva no swelling or erythema Sinuses: No Frontal/maxillary tenderness ENT/Mouth: Ext aud canals clear, TMs without erythema, bulging. No erythema, swelling, or exudate on post pharynx.  Tonsils not swollen or erythematous. Hearing normal.  Neck: Supple, thyroid normal.  Respiratory: Respiratory effort normal, BS equal bilaterally without rales, rhonchi, wheezing or stridor.  Cardio: RRR with no MRGs. Brisk peripheral pulses without edema.  Abdomen: Soft, + BS.  Non tender, no guarding, rebound, hernias, masses. Lymphatics: Non tender  without lymphadenopathy.  Musculoskeletal: Full ROM, 5/5 strength, normal gait.  Skin: Warm, dry without rashes, lesions, ecchymosis.  Neuro: Cranial nerves intact. Normal muscle tone, no cerebellar symptoms. Sensation intact.  Psych: Awake and oriented X 3, normal affect, Insight and Judgment appropriate.     Izora Ribas, NP 4:06 PM [facility]

## 2019-07-31 NOTE — Telephone Encounter (Signed)
Left message on voice mail  to call back

## 2019-08-01 ENCOUNTER — Ambulatory Visit (INDEPENDENT_AMBULATORY_CARE_PROVIDER_SITE_OTHER): Payer: Medicare HMO | Admitting: Adult Health

## 2019-08-01 ENCOUNTER — Other Ambulatory Visit: Payer: Self-pay

## 2019-08-01 ENCOUNTER — Encounter: Payer: Self-pay | Admitting: Adult Health

## 2019-08-01 VITALS — BP 116/64 | HR 55 | Temp 97.3°F | Ht 67.0 in | Wt 226.8 lb

## 2019-08-01 DIAGNOSIS — G4700 Insomnia, unspecified: Secondary | ICD-10-CM

## 2019-08-01 DIAGNOSIS — H401132 Primary open-angle glaucoma, bilateral, moderate stage: Secondary | ICD-10-CM | POA: Diagnosis not present

## 2019-08-01 DIAGNOSIS — R609 Edema, unspecified: Secondary | ICD-10-CM

## 2019-08-01 MED ORDER — ALPRAZOLAM 1 MG PO TABS: ORAL_TABLET | ORAL | 0 refills | Status: DC

## 2019-08-01 NOTE — Patient Instructions (Signed)

## 2019-08-02 ENCOUNTER — Other Ambulatory Visit: Payer: Self-pay | Admitting: Internal Medicine

## 2019-08-02 DIAGNOSIS — Z1231 Encounter for screening mammogram for malignant neoplasm of breast: Secondary | ICD-10-CM

## 2019-08-13 DIAGNOSIS — M25562 Pain in left knee: Secondary | ICD-10-CM | POA: Diagnosis not present

## 2019-08-13 DIAGNOSIS — M1711 Unilateral primary osteoarthritis, right knee: Secondary | ICD-10-CM | POA: Diagnosis not present

## 2019-08-13 DIAGNOSIS — M25561 Pain in right knee: Secondary | ICD-10-CM | POA: Diagnosis not present

## 2019-08-31 ENCOUNTER — Other Ambulatory Visit: Payer: Self-pay | Admitting: Internal Medicine

## 2019-09-03 ENCOUNTER — Other Ambulatory Visit: Payer: Self-pay | Admitting: *Deleted

## 2019-09-03 MED ORDER — GLUCOSE BLOOD VI STRP: ORAL_STRIP | 3 refills | Status: DC

## 2019-09-03 MED ORDER — TRUE METRIX LEVEL 1 LOW VI SOLN: 3 refills | Status: DC

## 2019-09-03 MED ORDER — TRUE METRIX METER W/DEVICE KIT: PACK | 0 refills | Status: DC

## 2019-09-03 MED ORDER — BD SWAB SINGLE USE REGULAR PADS: MEDICATED_PAD | 3 refills | Status: DC

## 2019-09-03 MED ORDER — LANCETS 28G MISC: 3 refills | Status: DC

## 2019-09-05 ENCOUNTER — Ambulatory Visit (INDEPENDENT_AMBULATORY_CARE_PROVIDER_SITE_OTHER): Payer: Medicare HMO

## 2019-09-05 ENCOUNTER — Other Ambulatory Visit: Payer: Self-pay

## 2019-09-05 ENCOUNTER — Other Ambulatory Visit: Payer: Self-pay | Admitting: Adult Health

## 2019-09-05 VITALS — BP 132/76 | Temp 98.3°F | Wt 225.0 lb

## 2019-09-05 DIAGNOSIS — Z79899 Other long term (current) drug therapy: Secondary | ICD-10-CM | POA: Diagnosis not present

## 2019-09-05 DIAGNOSIS — R58 Hemorrhage, not elsewhere classified: Secondary | ICD-10-CM | POA: Diagnosis not present

## 2019-09-05 NOTE — Progress Notes (Signed)
REPORTS FOR PT/INR  LABS ENTERED INTO EPIC BY PROVIDER  MED. CLEARANCE

## 2019-09-06 ENCOUNTER — Other Ambulatory Visit: Payer: Self-pay | Admitting: Adult Health

## 2019-09-06 LAB — PROTIME-INR
INR: 1
Prothrombin Time: 10.6 s (ref 9.0–11.5)

## 2019-09-06 MED ORDER — BISOPROLOL-HYDROCHLOROTHIAZIDE 10-6.25 MG PO TABS: ORAL_TABLET | ORAL | 0 refills | Status: DC

## 2019-09-09 ENCOUNTER — Ambulatory Visit (INDEPENDENT_AMBULATORY_CARE_PROVIDER_SITE_OTHER): Payer: Medicare HMO

## 2019-09-09 ENCOUNTER — Other Ambulatory Visit: Payer: Self-pay

## 2019-09-09 ENCOUNTER — Other Ambulatory Visit: Payer: Self-pay | Admitting: *Deleted

## 2019-09-09 DIAGNOSIS — Z79899 Other long term (current) drug therapy: Secondary | ICD-10-CM

## 2019-09-09 MED ORDER — BD SWAB SINGLE USE REGULAR PADS: MEDICATED_PAD | 3 refills | Status: AC

## 2019-09-09 MED ORDER — TRUE METRIX LEVEL 1 LOW VI SOLN: 3 refills | Status: DC

## 2019-09-09 NOTE — Progress Notes (Signed)
Patient presents to the office for a nurse visit to have BP and Pulse checked. Today's reading was 120/68 and pulse was 62. Patient taking meds as prescribed. Vitals taken and recorded.

## 2019-09-10 ENCOUNTER — Other Ambulatory Visit: Payer: Self-pay | Admitting: *Deleted

## 2019-09-10 DIAGNOSIS — M1711 Unilateral primary osteoarthritis, right knee: Secondary | ICD-10-CM | POA: Diagnosis not present

## 2019-09-10 MED ORDER — LANCETS 28G MISC: 3 refills | Status: DC

## 2019-09-10 MED ORDER — GLUCOSE BLOOD VI STRP: ORAL_STRIP | 3 refills | Status: DC

## 2019-09-10 MED ORDER — TRUE METRIX METER W/DEVICE KIT: PACK | 0 refills | Status: DC

## 2019-09-11 ENCOUNTER — Other Ambulatory Visit: Payer: Self-pay

## 2019-09-11 ENCOUNTER — Ambulatory Visit
Admission: RE | Admit: 2019-09-11 | Discharge: 2019-09-11 | Disposition: A | Discharge status: Home / Self Care | Payer: Medicare HMO | Source: Ambulatory Visit | Attending: Internal Medicine | Admitting: Internal Medicine

## 2019-09-11 DIAGNOSIS — Z1231 Encounter for screening mammogram for malignant neoplasm of breast: Secondary | ICD-10-CM

## 2019-09-20 DIAGNOSIS — Z96651 Presence of right artificial knee joint: Secondary | ICD-10-CM | POA: Diagnosis not present

## 2019-09-20 DIAGNOSIS — G8918 Other acute postprocedural pain: Secondary | ICD-10-CM | POA: Diagnosis not present

## 2019-09-20 DIAGNOSIS — M1711 Unilateral primary osteoarthritis, right knee: Secondary | ICD-10-CM | POA: Diagnosis not present

## 2019-09-23 DIAGNOSIS — I10 Essential (primary) hypertension: Secondary | ICD-10-CM | POA: Diagnosis not present

## 2019-09-23 DIAGNOSIS — Z471 Aftercare following joint replacement surgery: Secondary | ICD-10-CM | POA: Diagnosis not present

## 2019-09-23 DIAGNOSIS — Z79891 Long term (current) use of opiate analgesic: Secondary | ICD-10-CM | POA: Diagnosis not present

## 2019-09-23 DIAGNOSIS — Z96641 Presence of right artificial hip joint: Secondary | ICD-10-CM | POA: Diagnosis not present

## 2019-09-23 DIAGNOSIS — Z96651 Presence of right artificial knee joint: Secondary | ICD-10-CM | POA: Diagnosis not present

## 2019-09-23 DIAGNOSIS — K5900 Constipation, unspecified: Secondary | ICD-10-CM | POA: Diagnosis not present

## 2019-09-23 DIAGNOSIS — Z9181 History of falling: Secondary | ICD-10-CM | POA: Diagnosis not present

## 2019-09-23 DIAGNOSIS — R32 Unspecified urinary incontinence: Secondary | ICD-10-CM | POA: Diagnosis not present

## 2019-09-23 DIAGNOSIS — Z7982 Long term (current) use of aspirin: Secondary | ICD-10-CM | POA: Diagnosis not present

## 2019-09-24 DIAGNOSIS — Z96651 Presence of right artificial knee joint: Secondary | ICD-10-CM | POA: Diagnosis not present

## 2019-09-24 DIAGNOSIS — I10 Essential (primary) hypertension: Secondary | ICD-10-CM | POA: Diagnosis not present

## 2019-09-24 DIAGNOSIS — Z471 Aftercare following joint replacement surgery: Secondary | ICD-10-CM | POA: Diagnosis not present

## 2019-09-24 DIAGNOSIS — Z7982 Long term (current) use of aspirin: Secondary | ICD-10-CM | POA: Diagnosis not present

## 2019-09-24 DIAGNOSIS — Z79891 Long term (current) use of opiate analgesic: Secondary | ICD-10-CM | POA: Diagnosis not present

## 2019-09-24 DIAGNOSIS — R32 Unspecified urinary incontinence: Secondary | ICD-10-CM | POA: Diagnosis not present

## 2019-09-24 DIAGNOSIS — K5900 Constipation, unspecified: Secondary | ICD-10-CM | POA: Diagnosis not present

## 2019-09-24 DIAGNOSIS — Z9181 History of falling: Secondary | ICD-10-CM | POA: Diagnosis not present

## 2019-09-24 DIAGNOSIS — Z96641 Presence of right artificial hip joint: Secondary | ICD-10-CM | POA: Diagnosis not present

## 2019-09-25 DIAGNOSIS — I10 Essential (primary) hypertension: Secondary | ICD-10-CM | POA: Diagnosis not present

## 2019-09-25 DIAGNOSIS — Z471 Aftercare following joint replacement surgery: Secondary | ICD-10-CM | POA: Diagnosis not present

## 2019-09-25 DIAGNOSIS — K5900 Constipation, unspecified: Secondary | ICD-10-CM | POA: Diagnosis not present

## 2019-09-25 DIAGNOSIS — R32 Unspecified urinary incontinence: Secondary | ICD-10-CM | POA: Diagnosis not present

## 2019-09-25 DIAGNOSIS — Z79891 Long term (current) use of opiate analgesic: Secondary | ICD-10-CM | POA: Diagnosis not present

## 2019-09-25 DIAGNOSIS — Z9181 History of falling: Secondary | ICD-10-CM | POA: Diagnosis not present

## 2019-09-25 DIAGNOSIS — Z96651 Presence of right artificial knee joint: Secondary | ICD-10-CM | POA: Diagnosis not present

## 2019-09-25 DIAGNOSIS — Z96641 Presence of right artificial hip joint: Secondary | ICD-10-CM | POA: Diagnosis not present

## 2019-09-25 DIAGNOSIS — Z7982 Long term (current) use of aspirin: Secondary | ICD-10-CM | POA: Diagnosis not present

## 2019-09-27 DIAGNOSIS — R32 Unspecified urinary incontinence: Secondary | ICD-10-CM | POA: Diagnosis not present

## 2019-09-27 DIAGNOSIS — Z96641 Presence of right artificial hip joint: Secondary | ICD-10-CM | POA: Diagnosis not present

## 2019-09-27 DIAGNOSIS — Z79891 Long term (current) use of opiate analgesic: Secondary | ICD-10-CM | POA: Diagnosis not present

## 2019-09-27 DIAGNOSIS — Z471 Aftercare following joint replacement surgery: Secondary | ICD-10-CM | POA: Diagnosis not present

## 2019-09-27 DIAGNOSIS — Z9181 History of falling: Secondary | ICD-10-CM | POA: Diagnosis not present

## 2019-09-27 DIAGNOSIS — Z96651 Presence of right artificial knee joint: Secondary | ICD-10-CM | POA: Diagnosis not present

## 2019-09-27 DIAGNOSIS — K5900 Constipation, unspecified: Secondary | ICD-10-CM | POA: Diagnosis not present

## 2019-09-27 DIAGNOSIS — Z7982 Long term (current) use of aspirin: Secondary | ICD-10-CM | POA: Diagnosis not present

## 2019-09-27 DIAGNOSIS — I10 Essential (primary) hypertension: Secondary | ICD-10-CM | POA: Diagnosis not present

## 2019-09-30 DIAGNOSIS — R32 Unspecified urinary incontinence: Secondary | ICD-10-CM | POA: Diagnosis not present

## 2019-09-30 DIAGNOSIS — Z471 Aftercare following joint replacement surgery: Secondary | ICD-10-CM | POA: Diagnosis not present

## 2019-09-30 DIAGNOSIS — K5900 Constipation, unspecified: Secondary | ICD-10-CM | POA: Diagnosis not present

## 2019-09-30 DIAGNOSIS — Z96651 Presence of right artificial knee joint: Secondary | ICD-10-CM | POA: Diagnosis not present

## 2019-09-30 DIAGNOSIS — Z9181 History of falling: Secondary | ICD-10-CM | POA: Diagnosis not present

## 2019-09-30 DIAGNOSIS — I10 Essential (primary) hypertension: Secondary | ICD-10-CM | POA: Diagnosis not present

## 2019-09-30 DIAGNOSIS — Z96641 Presence of right artificial hip joint: Secondary | ICD-10-CM | POA: Diagnosis not present

## 2019-09-30 DIAGNOSIS — Z7982 Long term (current) use of aspirin: Secondary | ICD-10-CM | POA: Diagnosis not present

## 2019-09-30 DIAGNOSIS — Z79891 Long term (current) use of opiate analgesic: Secondary | ICD-10-CM | POA: Diagnosis not present

## 2019-10-01 DIAGNOSIS — M25561 Pain in right knee: Secondary | ICD-10-CM | POA: Diagnosis not present

## 2019-10-01 DIAGNOSIS — M25661 Stiffness of right knee, not elsewhere classified: Secondary | ICD-10-CM | POA: Diagnosis not present

## 2019-10-01 DIAGNOSIS — M6281 Muscle weakness (generalized): Secondary | ICD-10-CM | POA: Diagnosis not present

## 2019-10-01 DIAGNOSIS — Z96651 Presence of right artificial knee joint: Secondary | ICD-10-CM | POA: Diagnosis not present

## 2019-10-01 DIAGNOSIS — Z471 Aftercare following joint replacement surgery: Secondary | ICD-10-CM | POA: Diagnosis not present

## 2019-10-04 DIAGNOSIS — Z96651 Presence of right artificial knee joint: Secondary | ICD-10-CM | POA: Diagnosis not present

## 2019-10-04 DIAGNOSIS — M6281 Muscle weakness (generalized): Secondary | ICD-10-CM | POA: Diagnosis not present

## 2019-10-04 DIAGNOSIS — M25661 Stiffness of right knee, not elsewhere classified: Secondary | ICD-10-CM | POA: Diagnosis not present

## 2019-10-07 DIAGNOSIS — M25661 Stiffness of right knee, not elsewhere classified: Secondary | ICD-10-CM | POA: Diagnosis not present

## 2019-10-07 DIAGNOSIS — M6281 Muscle weakness (generalized): Secondary | ICD-10-CM | POA: Diagnosis not present

## 2019-10-07 DIAGNOSIS — Z96651 Presence of right artificial knee joint: Secondary | ICD-10-CM | POA: Diagnosis not present

## 2019-10-08 ENCOUNTER — Ambulatory Visit: Payer: Medicare HMO | Admitting: Internal Medicine

## 2019-10-09 DIAGNOSIS — Z96651 Presence of right artificial knee joint: Secondary | ICD-10-CM | POA: Diagnosis not present

## 2019-10-09 DIAGNOSIS — M25661 Stiffness of right knee, not elsewhere classified: Secondary | ICD-10-CM | POA: Diagnosis not present

## 2019-10-09 DIAGNOSIS — M6281 Muscle weakness (generalized): Secondary | ICD-10-CM | POA: Diagnosis not present

## 2019-10-11 DIAGNOSIS — M25661 Stiffness of right knee, not elsewhere classified: Secondary | ICD-10-CM | POA: Diagnosis not present

## 2019-10-11 DIAGNOSIS — Z96651 Presence of right artificial knee joint: Secondary | ICD-10-CM | POA: Diagnosis not present

## 2019-10-11 DIAGNOSIS — M6281 Muscle weakness (generalized): Secondary | ICD-10-CM | POA: Diagnosis not present

## 2019-10-15 DIAGNOSIS — M25661 Stiffness of right knee, not elsewhere classified: Secondary | ICD-10-CM | POA: Diagnosis not present

## 2019-10-15 DIAGNOSIS — M6281 Muscle weakness (generalized): Secondary | ICD-10-CM | POA: Diagnosis not present

## 2019-10-15 DIAGNOSIS — Z96651 Presence of right artificial knee joint: Secondary | ICD-10-CM | POA: Diagnosis not present

## 2019-10-16 ENCOUNTER — Other Ambulatory Visit: Payer: Self-pay | Admitting: Adult Health

## 2019-10-17 DIAGNOSIS — M25661 Stiffness of right knee, not elsewhere classified: Secondary | ICD-10-CM | POA: Diagnosis not present

## 2019-10-17 DIAGNOSIS — M6281 Muscle weakness (generalized): Secondary | ICD-10-CM | POA: Diagnosis not present

## 2019-10-17 DIAGNOSIS — Z96651 Presence of right artificial knee joint: Secondary | ICD-10-CM | POA: Diagnosis not present

## 2019-10-22 DIAGNOSIS — Z96651 Presence of right artificial knee joint: Secondary | ICD-10-CM | POA: Diagnosis not present

## 2019-10-22 DIAGNOSIS — M25661 Stiffness of right knee, not elsewhere classified: Secondary | ICD-10-CM | POA: Diagnosis not present

## 2019-10-22 DIAGNOSIS — M6281 Muscle weakness (generalized): Secondary | ICD-10-CM | POA: Diagnosis not present

## 2019-10-24 DIAGNOSIS — M6281 Muscle weakness (generalized): Secondary | ICD-10-CM | POA: Diagnosis not present

## 2019-10-24 DIAGNOSIS — Z96651 Presence of right artificial knee joint: Secondary | ICD-10-CM | POA: Diagnosis not present

## 2019-10-24 DIAGNOSIS — M25661 Stiffness of right knee, not elsewhere classified: Secondary | ICD-10-CM | POA: Diagnosis not present

## 2019-10-29 DIAGNOSIS — M25661 Stiffness of right knee, not elsewhere classified: Secondary | ICD-10-CM | POA: Diagnosis not present

## 2019-10-29 DIAGNOSIS — Z96651 Presence of right artificial knee joint: Secondary | ICD-10-CM | POA: Diagnosis not present

## 2019-10-29 DIAGNOSIS — M6281 Muscle weakness (generalized): Secondary | ICD-10-CM | POA: Diagnosis not present

## 2019-10-31 ENCOUNTER — Ambulatory Visit: Payer: Medicare HMO | Admitting: Internal Medicine

## 2019-10-31 DIAGNOSIS — M6281 Muscle weakness (generalized): Secondary | ICD-10-CM | POA: Diagnosis not present

## 2019-10-31 DIAGNOSIS — M25661 Stiffness of right knee, not elsewhere classified: Secondary | ICD-10-CM | POA: Diagnosis not present

## 2019-10-31 DIAGNOSIS — Z96651 Presence of right artificial knee joint: Secondary | ICD-10-CM | POA: Diagnosis not present

## 2019-11-06 DIAGNOSIS — M25661 Stiffness of right knee, not elsewhere classified: Secondary | ICD-10-CM | POA: Diagnosis not present

## 2019-11-06 DIAGNOSIS — Z96651 Presence of right artificial knee joint: Secondary | ICD-10-CM | POA: Diagnosis not present

## 2019-11-06 DIAGNOSIS — M6281 Muscle weakness (generalized): Secondary | ICD-10-CM | POA: Diagnosis not present

## 2019-11-08 DIAGNOSIS — Z96651 Presence of right artificial knee joint: Secondary | ICD-10-CM | POA: Diagnosis not present

## 2019-11-08 DIAGNOSIS — M25661 Stiffness of right knee, not elsewhere classified: Secondary | ICD-10-CM | POA: Diagnosis not present

## 2019-11-08 DIAGNOSIS — M6281 Muscle weakness (generalized): Secondary | ICD-10-CM | POA: Diagnosis not present

## 2019-11-12 DIAGNOSIS — M25661 Stiffness of right knee, not elsewhere classified: Secondary | ICD-10-CM | POA: Diagnosis not present

## 2019-11-12 DIAGNOSIS — M6281 Muscle weakness (generalized): Secondary | ICD-10-CM | POA: Diagnosis not present

## 2019-11-12 DIAGNOSIS — Z96651 Presence of right artificial knee joint: Secondary | ICD-10-CM | POA: Diagnosis not present

## 2019-11-14 DIAGNOSIS — Z96651 Presence of right artificial knee joint: Secondary | ICD-10-CM | POA: Diagnosis not present

## 2019-11-14 DIAGNOSIS — M25661 Stiffness of right knee, not elsewhere classified: Secondary | ICD-10-CM | POA: Diagnosis not present

## 2019-11-14 DIAGNOSIS — M6281 Muscle weakness (generalized): Secondary | ICD-10-CM | POA: Diagnosis not present

## 2019-11-17 NOTE — Progress Notes (Signed)
History of Present Illness:       This very nice 79 y.o. DBF  presents for 6 month follow up with HTN, HLD, Pre-Diabetes and Vitamin D Deficiency. Patient also has hx/o Gout in the past.      Patient is treated for HTN  (1997) & BP has been controlled at home. Today's BP is at goal - 130/80. Patient has had no complaints of any cardiac type chest pain, palpitations, dyspnea / orthopnea / PND, dizziness, claudication, or dependent edema.      Hyperlipidemia is controlled with diet & Rosuvastatin. Patient denies myalgias or other med SE's. Last Lipids were at goal:  Lab Results  Component Value Date   CHOL 156 07/18/2019   HDL 70 07/18/2019   LDLCALC 67 07/18/2019   TRIG 108 07/18/2019   CHOLHDL 2.2 07/18/2019    Also, the patient  has moderate Obesity (BMI 35+) and consequent  T2_NIDDM (2013) / CKD2 (GFR 77) & PSN which she is attempting control by diet and she has had no symptoms of reactive hypoglycemia, diabetic polys or visual blurring, but does report numb paresthesias of her feet.  Last A1c was not at goal:   Lab Results  Component Value Date   HGBA1C 6.4 (H) 07/18/2019       Further, the patient also has history of Vitamin D Deficiency ("26"/2008) and supplements vitamin D without any suspected side-effects. Last vitamin D was at goal:  Lab Results  Component Value Date   VD25OH 68 04/04/2019    Current Outpatient Medications on File Prior to Visit  Medication Sig  . ALPRAZolam (XANAX) 1 MG tablet Take 1/2-1 tablet 1 to 2 /day   . aspirin 81 MG tablet Takes two tablets daily  . bisoprolol-hctz 10-6.25 MG tablet Take 1/2 tablet Daily for BP  . VITAMIN D 5000 UNITS  Take 1 tablet by mouth daily.  . furosemide (LASIX) 40 MG tablet Take 1 tablet Daily f  . gabapentin  300 MG capsule TAKE 1 CAPSULE FOUR TIMES DAILY   . glucose blood test strip Check blood sugar 1 time a day-DX-E11.9.  . Lancets 28G MISC Check blood sugar 1 time a day-DX-E11.9  . latanoprost 0.005  % ophthalmic solution Place 1 drop into both eyes daily.   Marland Kitchen levothyroxine 100 MCG tablet Take 1 tablet daily   . Magnesium 500 MG CAPS Take 2 capsules by mouth daily.   . Omega-3 Fatty Acids (OMEGA 3 500 PO) Take 1 capsule by mouth daily.  Marland Kitchen omeprazole 20 MG capsule TAKE 1 CAPSULE TWICE A DAY   . rosuvastatin 5 MG tablet Ptake 1 tab by mouth three nights (MWF)  . timolol 0.5 % ophth solution Place 1 drop into both eyes 2 (two) times daily.     Allergies  Allergen Reactions  . Meloxicam Swelling    Weight gain    PMHx:   Past Medical History:  Diagnosis Date  . Anal fissure   . Arthritis   . Carotid stenosis, <50%, bilateral  12/20/2018  . Colon polyps    hyperplastic  . Diverticulosis   . GERD (gastroesophageal reflux disease)   . Glaucoma   . Hemorrhoids   . Hyperlipidemia   . Hypertension   . Hypothyroidism   . Prediabetes     Immunization History  Administered Date(s) Administered  . Influenza, High Dose Seasonal PF 11/24/2017, 10/17/2018  . Influenza-Unspecified 12/09/2013, 11/14/2015, 11/23/2016  . PFIZER SARS-COV-2 Vaccination 05/12/2019, 06/01/2019  .  Pneumococcal Conjugate-13 04/21/2015  . Pneumococcal Polysaccharide-23 05/29/2008  . Td 04/26/2007    Past Surgical History:  Procedure Laterality Date  . ABDOMINAL HYSTERECTOMY    . APPENDECTOMY    . BLADDER SUSPENSION    . COLONOSCOPY    . JOINT REPLACEMENT  2007   rt hip replacement  . KNEE ARTHROSCOPY  03/28/2011   Procedure: ARTHROSCOPY KNEE;  Surgeon: Kerin Salen, MD;  Location: [facility];  Service: Orthopedics;  Laterality: Right;  with partial lateral meniscectomy, Debridement of Chondromalacia    FHx:    Reviewed / unchanged  SHx:    Reviewed / unchanged   Systems Review:  Constitutional: Denies fever, chills, wt changes, headaches, insomnia, fatigue, night sweats, change in appetite. Eyes: Denies redness, blurred vision, diplopia, discharge, itchy, watery eyes.  ENT:  Denies discharge, congestion, post nasal drip, epistaxis, sore throat, earache, hearing loss, dental pain, tinnitus, vertigo, sinus pain, snoring.  CV: Denies chest pain, palpitations, irregular heartbeat, syncope, dyspnea, diaphoresis, orthopnea, PND, claudication or edema. Respiratory: denies cough, dyspnea, DOE, pleurisy, hoarseness, laryngitis, wheezing.  Gastrointestinal: Denies dysphagia, odynophagia, heartburn, reflux, water brash, abdominal pain or cramps, nausea, vomiting, bloating, diarrhea, constipation, hematemesis, melena, hematochezia  or hemorrhoids. Genitourinary: Denies dysuria, frequency, urgency, nocturia, hesitancy, discharge, hematuria or flank pain. Musculoskeletal: Denies arthralgias, myalgias, stiffness, jt. swelling, pain, limping or strain/sprain.  Skin: Denies pruritus, rash, hives, warts, acne, eczema or change in skin lesion(s). Neuro: No weakness, tremor, incoordination, spasms, paresthesia or pain. Psychiatric: Denies confusion, memory loss or sensory loss. Endo: Denies change in weight, skin or hair change.  Heme/Lymph: No excessive bleeding, bruising or enlarged lymph nodes.  Physical Exam  BP 130/80   Pulse 63   Temp 97.6 F (36.4 C)   Wt 217 lb (98.4 kg)   SpO2 97%   BMI 33.99 kg/m   Appears  over nourished, well groomed  and in no distress.  Eyes: PERRLA, EOMs, conjunctiva no swelling or erythema. Sinuses: No frontal/maxillary tenderness ENT/Mouth: EAC's clear, TM's nl w/o erythema, bulging. Nares clear w/o erythema, swelling, exudates. Oropharynx clear without erythema or exudates. Oral hygiene is good. Tongue normal, non obstructing. Hearing intact.  Neck: Supple. Thyroid not palpable. Car 2+/2+ without bruits, nodes or JVD. Chest: Respirations nl with BS clear & equal w/o rales, rhonchi, wheezing or stridor.  Cor: Heart sounds normal w/ regular rate and rhythm without sig. murmurs, gallops, clicks or rubs. Peripheral pulses normal and equal   without edema.  Abdomen: Soft, rotund & bowel sounds normal. Non-tender w/o guarding, rebound, hernias, masses or organomegaly.  Lymphatics: Unremarkable.  Musculoskeletal: Full ROM all peripheral extremities, joint stability, 5/5 strength and normal gait.  Skin: Warm, dry without exposed rashes, lesions or ecchymosis apparent.  Neuro: Cranial nerves intact, reflexes equal bilaterally. Sensory-motor testing grossly intact. Tendon reflexes grossly intact.  Pysch: Alert & oriented x 3.  Insight and judgement nl & appropriate. No ideations.  Assessment and Plan:  1. Essential hypertension  - Continue medication, monitor blood pressure at home.  - Continue DASH diet.  Reminder to go to the ER if any CP,  SOB, nausea, dizziness, severe HA, changes vision/speech.  - CBC with Differential/Platelet - COMPLETE METABOLIC PANEL WITH GFR - Magnesium - TSH  2. Hyperlipidemia associated with type 2 diabetes mellitus ([place])  - Continue diet/meds, exercise,& lifestyle modifications.  - Continue monitor periodic cholesterol/liver & renal functions   - Lipid panel - TSH  3. Controlled type 2 diabetes mellitus with stage 2 chronic  kidney disease, without long-term current use of insulin (HCC)  - Continue diet, exercise  - Lifestyle modifications.  - Monitor appropriate labs.  - Hemoglobin A1c - Insulin, random  4. Vitamin D deficiency  - Continue supplementation.  - VITAMIN D 25 Hydroxy   5. Gout  - Uric acid  6. Medication management  - CBC with Differential/Platelet - COMPLETE METABOLIC PANEL WITH GFR - Magnesium - Lipid panel - TSH - Hemoglobin A1c - Insulin, random - VITAMIN D 25 Hydroxy - Uric acid        Discussed  regular exercise, BP monitoring, weight control to achieve/maintain BMI less than 25 and discussed med and SE's. Recommended labs to assess and monitor clinical status with further disposition pending results of labs.  I discussed the assessment and treatment  plan with the patient. The patient was provided an opportunity to ask questions and all were answered. The patient agreed with the plan and demonstrated an understanding of the instructions.  I provided over 30 minutes of exam, counseling, chart review and  complex critical decision making.         The patient was advised to call back or seek an in-person evaluation if the symptoms worsen or if the condition fails to improve as anticipated.   Kirtland Bouchard, MD

## 2019-11-17 NOTE — Patient Instructions (Signed)

## 2019-11-18 ENCOUNTER — Encounter: Payer: Self-pay | Admitting: Internal Medicine

## 2019-11-18 ENCOUNTER — Other Ambulatory Visit: Payer: Self-pay

## 2019-11-18 ENCOUNTER — Ambulatory Visit (INDEPENDENT_AMBULATORY_CARE_PROVIDER_SITE_OTHER): Payer: Medicare HMO | Admitting: Internal Medicine

## 2019-11-18 VITALS — BP 130/80 | HR 63 | Temp 97.6°F | Ht 67.0 in | Wt 217.0 lb

## 2019-11-18 DIAGNOSIS — E559 Vitamin D deficiency, unspecified: Secondary | ICD-10-CM

## 2019-11-18 DIAGNOSIS — E785 Hyperlipidemia, unspecified: Secondary | ICD-10-CM | POA: Diagnosis not present

## 2019-11-18 DIAGNOSIS — N182 Chronic kidney disease, stage 2 (mild): Secondary | ICD-10-CM | POA: Diagnosis not present

## 2019-11-18 DIAGNOSIS — E1122 Type 2 diabetes mellitus with diabetic chronic kidney disease: Secondary | ICD-10-CM | POA: Diagnosis not present

## 2019-11-18 DIAGNOSIS — E1169 Type 2 diabetes mellitus with other specified complication: Secondary | ICD-10-CM

## 2019-11-18 DIAGNOSIS — I10 Essential (primary) hypertension: Secondary | ICD-10-CM | POA: Diagnosis not present

## 2019-11-18 DIAGNOSIS — M109 Gout, unspecified: Secondary | ICD-10-CM | POA: Diagnosis not present

## 2019-11-18 DIAGNOSIS — Z79899 Other long term (current) drug therapy: Secondary | ICD-10-CM

## 2019-11-18 MED ORDER — GABAPENTIN 600 MG PO TABS: ORAL_TABLET | ORAL | 1 refills | Status: DC

## 2019-11-19 DIAGNOSIS — Z96651 Presence of right artificial knee joint: Secondary | ICD-10-CM | POA: Diagnosis not present

## 2019-11-19 DIAGNOSIS — M25661 Stiffness of right knee, not elsewhere classified: Secondary | ICD-10-CM | POA: Diagnosis not present

## 2019-11-19 DIAGNOSIS — M6281 Muscle weakness (generalized): Secondary | ICD-10-CM | POA: Diagnosis not present

## 2019-11-19 LAB — CBC WITH DIFFERENTIAL/PLATELET
Absolute Monocytes: 470 cells/uL (ref 200–950)
Basophils Absolute: 50 cells/uL (ref 0–200)
Basophils Relative: 1 %
Eosinophils Absolute: 90 cells/uL (ref 15–500)
Eosinophils Relative: 1.8 %
HCT: 42.2 % (ref 35.0–45.0)
Hemoglobin: 13.7 g/dL (ref 11.7–15.5)
Lymphs Abs: 2410 cells/uL (ref 850–3900)
MCH: 26.9 pg — ABNORMAL LOW (ref 27.0–33.0)
MCHC: 32.5 g/dL (ref 32.0–36.0)
MCV: 82.7 fL (ref 80.0–100.0)
MPV: 9.7 fL (ref 7.5–12.5)
Monocytes Relative: 9.4 %
Neutro Abs: 1980 cells/uL (ref 1500–7800)
Neutrophils Relative %: 39.6 %
Platelets: 288 10*3/uL (ref 140–400)
RBC: 5.1 10*6/uL (ref 3.80–5.10)
RDW: 13 % (ref 11.0–15.0)
Total Lymphocyte: 48.2 %
WBC: 5 10*3/uL (ref 3.8–10.8)

## 2019-11-19 LAB — HEMOGLOBIN A1C
Hgb A1c MFr Bld: 6.4 % of total Hgb — ABNORMAL HIGH (ref <5.7)
Mean Plasma Glucose: 137 (calc)
eAG (mmol/L): 7.6 (calc)

## 2019-11-19 LAB — VITAMIN D 25 HYDROXY (VIT D DEFICIENCY, FRACTURES): Vit D, 25-Hydroxy: 79 ng/mL (ref 30–100)

## 2019-11-19 LAB — MAGNESIUM: Magnesium: 2 mg/dL (ref 1.5–2.5)

## 2019-11-19 LAB — COMPLETE METABOLIC PANEL WITH GFR
AG Ratio: 1.8 (calc) (ref 1.0–2.5)
ALT: 8 U/L (ref 6–29)
AST: 10 U/L (ref 10–35)
Albumin: 4.8 g/dL (ref 3.6–5.1)
Alkaline phosphatase (APISO): 71 U/L (ref 37–153)
BUN: 17 mg/dL (ref 7–25)
CO2: 32 mmol/L (ref 20–32)
Calcium: 11.4 mg/dL — ABNORMAL HIGH (ref 8.6–10.4)
Chloride: 99 mmol/L (ref 98–110)
Creat: 0.93 mg/dL (ref 0.60–0.93)
GFR, Est African American: 68 mL/min/{1.73_m2} (ref >=60)
GFR, Est Non African American: 59 mL/min/{1.73_m2} — ABNORMAL LOW (ref >=60)
Globulin: 2.6 g/dL (calc) (ref 1.9–3.7)
Glucose, Bld: 152 mg/dL — ABNORMAL HIGH (ref 65–99)
Potassium: 3.9 mmol/L (ref 3.5–5.3)
Sodium: 142 mmol/L (ref 135–146)
Total Bilirubin: 0.8 mg/dL (ref 0.2–1.2)
Total Protein: 7.4 g/dL (ref 6.1–8.1)

## 2019-11-19 LAB — URIC ACID: Uric Acid, Serum: 5.7 mg/dL (ref 2.5–7.0)

## 2019-11-19 LAB — LIPID PANEL
Cholesterol: 171 mg/dL (ref <200)
HDL: 88 mg/dL (ref >=50)
LDL Cholesterol (Calc): 65 mg/dL (calc)
Non-HDL Cholesterol (Calc): 83 mg/dL (calc) (ref <130)
Total CHOL/HDL Ratio: 1.9 (calc) (ref <5.0)
Triglycerides: 93 mg/dL (ref <150)

## 2019-11-19 LAB — INSULIN, RANDOM: Insulin: 52.6 u[IU]/mL — ABNORMAL HIGH

## 2019-11-19 LAB — TSH: TSH: 1.29 mIU/L (ref 0.40–4.50)

## 2019-11-19 NOTE — Progress Notes (Signed)
==========================================================  -    Chol = 171 and LDL chol = 65  -Both.excellent ==========================================================  -  A1c = 6.4% - still too high - ideal or goal is less than 5.7%   +++++++++++++++++++++++++++++++++++++++++++++++++++++++++++ Your blood sugar and A1c are elevated.    Being diabetic has a  300% increased risk for heart attack,  stroke, cancer, and alzheimer- type vascular dementia.   It is very important that you work harder with diet by  avoiding all foods that are white except chicken,   fish & calliflower.  - Avoid white rice  (brown & wild rice is OK),   - Avoid white potatoes  (sweet potatoes in moderation is OK),   White bread or wheat bread or anything made out of   white flour like bagels, donuts, rolls, buns, biscuits, cakes,  - pastries, cookies, pizza crust, and pasta (made from  white flour & egg whites)   - vegetarian pasta or spinach or wheat pasta is OK.  - Multigrain breads like Arnold's, Pepperidge Farm or   multigrain sandwich thins or high fiber breads like   [place] bread or "Dave's Killer" breads that are  4 to 5 grams fiber per slice !  are best.    Diet, exercise and weight loss can reverse and cure  diabetes in the early stages.    - Diet, exercise and weight loss is very important in the   control and prevention of complications of diabetes which  affects every system in your body, ie.   -Brain - dementia/stroke,  - eyes - glaucoma/blindness,  - heart - heart attack/heart failure,  - kidneys - dialysis,  - stomach - gastric paralysis,  - intestines - malabsorption,  - nerves - severe painful neuritis,  - circulation - gangrene & loss of a leg(s)  - and finally  . . . . . . . . . . . . . . . . . .    - cancer and Alzheimers. ==========================================================  -  Uric Acid  / Gout test is Normal &  OK ==========================================================  -  All Else - CBC - Kidneys - Electrolytes - Liver - Magnesium & Thyroid    -  all  Normal / OK ====================================================

## 2019-11-21 DIAGNOSIS — M6281 Muscle weakness (generalized): Secondary | ICD-10-CM | POA: Diagnosis not present

## 2019-11-21 DIAGNOSIS — M25661 Stiffness of right knee, not elsewhere classified: Secondary | ICD-10-CM | POA: Diagnosis not present

## 2019-11-21 DIAGNOSIS — Z96651 Presence of right artificial knee joint: Secondary | ICD-10-CM | POA: Diagnosis not present

## 2019-11-25 ENCOUNTER — Other Ambulatory Visit: Payer: Self-pay | Admitting: Internal Medicine

## 2019-11-25 DIAGNOSIS — H25013 Cortical age-related cataract, bilateral: Secondary | ICD-10-CM | POA: Diagnosis not present

## 2019-11-25 DIAGNOSIS — H401131 Primary open-angle glaucoma, bilateral, mild stage: Secondary | ICD-10-CM | POA: Diagnosis not present

## 2019-11-28 ENCOUNTER — Other Ambulatory Visit: Payer: Self-pay | Admitting: Adult Health

## 2019-11-28 ENCOUNTER — Other Ambulatory Visit: Payer: Self-pay | Admitting: Internal Medicine

## 2019-11-28 DIAGNOSIS — M6281 Muscle weakness (generalized): Secondary | ICD-10-CM | POA: Diagnosis not present

## 2019-11-28 DIAGNOSIS — Z96651 Presence of right artificial knee joint: Secondary | ICD-10-CM | POA: Diagnosis not present

## 2019-11-28 DIAGNOSIS — M25661 Stiffness of right knee, not elsewhere classified: Secondary | ICD-10-CM | POA: Diagnosis not present

## 2019-11-28 MED ORDER — ROSUVASTATIN CALCIUM 5 MG PO TABS
ORAL_TABLET | ORAL | 3 refills | Status: DC
Start: 2019-11-28 — End: 2019-11-29

## 2019-11-29 ENCOUNTER — Other Ambulatory Visit: Payer: Self-pay | Admitting: *Deleted

## 2019-11-29 MED ORDER — ROSUVASTATIN CALCIUM 5 MG PO TABS: ORAL_TABLET | ORAL | 3 refills | Status: DC

## 2019-12-03 DIAGNOSIS — M6281 Muscle weakness (generalized): Secondary | ICD-10-CM | POA: Diagnosis not present

## 2019-12-03 DIAGNOSIS — Z96651 Presence of right artificial knee joint: Secondary | ICD-10-CM | POA: Diagnosis not present

## 2019-12-03 DIAGNOSIS — M25661 Stiffness of right knee, not elsewhere classified: Secondary | ICD-10-CM | POA: Diagnosis not present

## 2019-12-10 ENCOUNTER — Other Ambulatory Visit: Payer: Self-pay | Admitting: Adult Health

## 2019-12-10 ENCOUNTER — Telehealth: Payer: Self-pay

## 2019-12-10 NOTE — Telephone Encounter (Signed)
Continuation of therapy request for Alprazolam. Continuation not approved due to current hydromorphone script by pain management.

## 2020-01-04 ENCOUNTER — Other Ambulatory Visit: Payer: Self-pay | Admitting: Internal Medicine

## 2020-01-06 ENCOUNTER — Other Ambulatory Visit: Payer: Self-pay | Admitting: Adult Health

## 2020-02-19 ENCOUNTER — Ambulatory Visit: Payer: Medicare HMO | Admitting: Adult Health Nurse Practitioner

## 2020-03-30 ENCOUNTER — Ambulatory Visit: Payer: Medicare HMO | Admitting: Adult Health Nurse Practitioner

## 2020-04-06 ENCOUNTER — Ambulatory Visit (INDEPENDENT_AMBULATORY_CARE_PROVIDER_SITE_OTHER): Payer: Medicare Other | Admitting: Adult Health Nurse Practitioner

## 2020-04-06 ENCOUNTER — Encounter: Payer: Self-pay | Admitting: Adult Health Nurse Practitioner

## 2020-04-06 ENCOUNTER — Other Ambulatory Visit: Payer: Self-pay

## 2020-04-06 VITALS — BP 126/80 | HR 57 | Temp 98.7°F | Ht 67.0 in | Wt 223.2 lb

## 2020-04-06 DIAGNOSIS — E559 Vitamin D deficiency, unspecified: Secondary | ICD-10-CM

## 2020-04-06 DIAGNOSIS — E1122 Type 2 diabetes mellitus with diabetic chronic kidney disease: Secondary | ICD-10-CM | POA: Diagnosis not present

## 2020-04-06 DIAGNOSIS — N182 Chronic kidney disease, stage 2 (mild): Secondary | ICD-10-CM

## 2020-04-06 DIAGNOSIS — H409 Unspecified glaucoma: Secondary | ICD-10-CM | POA: Diagnosis not present

## 2020-04-06 DIAGNOSIS — E039 Hypothyroidism, unspecified: Secondary | ICD-10-CM

## 2020-04-06 DIAGNOSIS — K219 Gastro-esophageal reflux disease without esophagitis: Secondary | ICD-10-CM | POA: Diagnosis not present

## 2020-04-06 DIAGNOSIS — E1142 Type 2 diabetes mellitus with diabetic polyneuropathy: Secondary | ICD-10-CM

## 2020-04-06 DIAGNOSIS — I10 Essential (primary) hypertension: Secondary | ICD-10-CM

## 2020-04-06 DIAGNOSIS — R6889 Other general symptoms and signs: Secondary | ICD-10-CM | POA: Diagnosis not present

## 2020-04-06 DIAGNOSIS — G4700 Insomnia, unspecified: Secondary | ICD-10-CM

## 2020-04-06 DIAGNOSIS — N951 Menopausal and female climacteric states: Secondary | ICD-10-CM

## 2020-04-06 DIAGNOSIS — Z96651 Presence of right artificial knee joint: Secondary | ICD-10-CM

## 2020-04-06 DIAGNOSIS — E785 Hyperlipidemia, unspecified: Secondary | ICD-10-CM | POA: Diagnosis not present

## 2020-04-06 DIAGNOSIS — E1169 Type 2 diabetes mellitus with other specified complication: Secondary | ICD-10-CM

## 2020-04-06 DIAGNOSIS — M1990 Unspecified osteoarthritis, unspecified site: Secondary | ICD-10-CM

## 2020-04-06 DIAGNOSIS — E119 Type 2 diabetes mellitus without complications: Secondary | ICD-10-CM

## 2020-04-06 DIAGNOSIS — Z79899 Other long term (current) drug therapy: Secondary | ICD-10-CM | POA: Diagnosis not present

## 2020-04-06 DIAGNOSIS — Z0000 Encounter for general adult medical examination without abnormal findings: Secondary | ICD-10-CM

## 2020-04-06 DIAGNOSIS — Z0001 Encounter for general adult medical examination with abnormal findings: Secondary | ICD-10-CM | POA: Diagnosis not present

## 2020-04-06 NOTE — Progress Notes (Signed)
MEDICARE ANNUAL WELLNESS VISIT AND FOLLOW UP  Assessment:   Encounter for Medicare annual wellness exam Yearly Patient will schedule mammogram in July once receives letter to schedule  Essential hypertension - continue medications, DASH diet, exercise and monitor at home. Call if greater than 130/80. Continue taking Bisoprolol-HCTZ 10-6.25 half tablet - declines switch to ACE/ARB at this time  -     CBC with Differential/Platelet -     CMP/GFR -     TSH  Hyperlipidemia associated with T2DM (HCC) -continue rosuvastatin 75m, taking 3days a week,  for LDL goal <70 check lipids, decrease fatty foods, increase activity.   Gastroesophageal reflux disease, esophagitis presence not specified Off of medication; not doing well - sent in omeprazole 20 mg to start BID x 2 weeks, then try tapering down to once daily for 8-12 weeks.  Discussed diet, avoiding triggers and other lifestyle changes  Diet controlled T2DM ([facility]  Education: Reviewed 'ABCs' of diabetes management (respective goals in parentheses):  A1C (<7), blood pressure (<130/80), and cholesterol (LDL <70) Eye Exam yearly and Dental Exam every 6 months.  Dietary recommendations Physical Activity recommendations -     Hemoglobin A1c  CKD II associated with T2DM (HCC) Increase fluids, avoid NSAIDS, monitor sugars, will monitor  Hypothyroidism, unspecified type Taking Levothyroxine 1061mwhole tablet daily and half table on Friday. check TSH level, continue medications the same, reminded to take on an empty stomach 30-6078m before food.  -     TSH  Peripheral sensory neuropathy, diabetic (HCC) Control glucose, do daily food checks -     gabapentin (NEURONTIN) 300 MG capsule; TAKE 1-2 CAPSULES TID PRN DAILY  FOR  NEUROPATHY  PAIN  Osteoarthritis, unspecified osteoarthritis type, unspecified site S/P TKR Right. Followed by Dr. RowMayer Camelending surgery   Morbid obesity - BMI 35 with diabetes, htn, hyperlipidemia -Discussed  dietary and exercise modifications -recommended diet heavy in fruits and veggies and low in animal meats, cheeses, and dairy products - restart swimming regularly after knee surgery and covid 19 - try eating dinner earlier, eat off of salad plate, wait 10-15 min prior to getting seconds  Vitamin D deficiency Continue supplement  Medication management -     Magnesium  Glaucoma, unspecified glaucoma type, unspecified laterality Continue eye doctor follow up  Insomnia  Insomnia- good sleep hygiene discussed, increase day time activity, doing well with occasional xanax, PDMP reviewed   Hot flashes Some improvement with gabapentin; try estroven complete, or black cohosh/evening primrose Weight loss encouraged   Future Appointments  Date Time Provider DepPickrell/08/2020  2:00 PM [facility] None  07/09/2020 11:00 AM McKUnk PintoD [facility] None  04/08/2021  2:30 PM McCGarnet SierrasP GAAM-GAAIM None     Plan:   During the course of the visit the patient was educated and counseled about appropriate screening and preventive services including:    Pneumococcal vaccine   Influenza vaccine  Td vaccine  Screening electrocardiogram  Screening mammography  Bone densitometry screening  Colorectal cancer screening  Diabetes screening  Glaucoma screening  Nutrition counseling   Advanced directives: given info/requested   Subjective:   Rebecca Graves a 79 12o. female who presents for Medicare Annual Wellness Visit and 3 month follow up on HTN, T2 diabetes with CKD and neuropathy, hyperlipidemia, vitamin D def.  She checks her blood glucose intermittently in am readings range from 110-120.  She is established with Dr. RowMayer Camelr right knee TKR.  She has had continued  pain in this knee and follow up care with Ortho.  LAST OV 12/03/19.  S/p hysterectomy in 1980s. She recently tapered off of estrace which she was on for many years. Had normal  MGM in 08/2018. She has hot flashes but they are less.  Was recommended to take [facility] but she did not try this.  She has dx of insomnia and is prescribed xanax, reports taking 0.5 mg approx twice a week, last refill 30 tabs 08/02/2019.   GERD- managing at this time, taking omeprazole daily.  BMI is Body mass index is 34.96 kg/m., she has not been working on diet, stopped swimming after her niece passed away who she was swimming with. She admits she eats dinner too late, too much.  Wt Readings from Last 3 Encounters:  04/06/20 223 lb 3.2 oz (101.2 kg)  11/18/19 217 lb (98.4 kg)  09/09/19 223 lb (101.2 kg)   Her blood pressure has been controlled at home, today their BP is BP: 126/80 She does workout. She denies chest pain, shortness of breath, dizziness.   She is on cholesterol medication (on rosuvastatin 5 mg three days a week) and denies myalgias. Her cholesterol is at goal. The cholesterol last visit was:   Lab Results  Component Value Date   CHOL 178 04/06/2020   HDL 69 04/06/2020   LDLCALC 79 04/06/2020   TRIG 206 (H) 04/06/2020   CHOLHDL 2.6 04/06/2020   She has been working on diet and exercise for Diet controlled T2DM with CKD 2 and neuropathy.  She has cut out soda, switched to diet tea, she is on ASA and she is not on ACE - declines at this time  Denies increased appetite, nausea, polydipsia, polyuria and vomiting.  She does have neuropathy, does well during the day, using gabapentin at night.  She checks occasional fasting sugars, typically 100-120.  Last A1C in the office was:  Lab Results  Component Value Date   HGBA1C 6.4 (H) 11/18/2019   She has been pushing fluid intake:   Lab Results  Component Value Date   GFRAA 72 (L) 04/06/2020   Patient is on Vitamin D supplement and at goal at recent check:  Lab Results  Component Value Date   VD25OH 79 11/18/2019     She is on thyroid medication. Her medication was not changed last visit, on 1/2  pill on F and 1  pill the other days. Patient denies nervousness, palpitations and weight changes.  Lab Results  Component Value Date   TSH 5.44 (H) 04/06/2020     Medication Review Current Outpatient Medications on File Prior to Visit  Medication Sig Dispense Refill  . Alcohol Swabs (B-D SINGLE USE SWABS REGULAR) PADS Use 1 swab prior to checking blood sugar daily-DX-E11.9 100 each 3  . ALPRAZolam (XANAX) 1 MG tablet Take 1/2-1 tablet 1 to 2 /day ONLY if needed for sleep or anxiety & limit to 5 days /week to avoid Addiction or Dementia 30 tablet 0  . aspirin 81 MG tablet Take 81 mg by mouth daily. Takes two tablets daily    . bisoprolol-hydrochlorothiazide (ZIAC) 10-6.25 MG tablet TAKE 1 TABLET EVERY DAY FOR BLOOD PRESSURE 90 tablet 0  . Blood Glucose Calibration (TRUE METRIX LEVEL 1) Low SOLN Check blood sugar 1 time daily-DX-E11.9. 3 each 3  . Blood Glucose Monitoring Suppl (TRUE METRIX METER) w/Device KIT Check blood sugar 1 time daily-DX-E11.9 1 kit 0  . Cholecalciferol (VITAMIN D-3) 5000 UNITS TABS Take 1 tablet  by mouth daily.    . furosemide (LASIX) 40 MG tablet TAKE 1 TABLET EVERY DAY FOR BLOOD PRESSURE  AND  FLUID  RETENTION/ANKLE SWELLING 90 tablet 0  . gabapentin (NEURONTIN) 600 MG tablet Take     1/2 to 1 tablet       2 to 3 x /Daily      as needed for Pain 270 tablet 1  . glucose blood test strip Check blood sugar 1 time a day-DX-E11.9. 100 each 3  . Lancets 28G MISC Check blood sugar 1 time a day-DX-E11.9 100 each 3  . latanoprost (XALATAN) 0.005 % ophthalmic solution Place 1 drop into both eyes daily.     Marland Kitchen levothyroxine (SYNTHROID) 100 MCG tablet Take      1 tablet       Daily       on an empty stomach with only water for 30 minutes & no Antacid meds, Calcium or Magnesium for 4 hours & avoid Biotin 90 tablet 0  . Magnesium 500 MG CAPS Take 2 capsules by mouth daily.     . Omega-3 Fatty Acids (OMEGA 3 500 PO) Take 1 capsule by mouth daily.    Marland Kitchen omeprazole (PRILOSEC) 20 MG capsule TAKE 1  CAPSULE TWICE A DAY PRIOR TO MEALS AS NEEDED FOR ACID REFLUX. 180 capsule 0  . rosuvastatin (CRESTOR) 5 MG tablet Take      1 tablet       3 x /week        on Mon Wed & Fri for Cholesterol 39 tablet 0  . timolol (TIMOPTIC) 0.5 % ophthalmic solution Place 1 drop into both eyes 2 (two) times daily.      No current facility-administered medications on file prior to visit.    Current Problems (verified) Patient Active Problem List   Diagnosis Date Noted  . Diet-controlled diabetes mellitus ([place]) 06/19/2018  . Insomnia 10/26/2017  . Diabetic peripheral neuropathy ([place]) 01/18/2015  . CKD stage 2 due to type 2 diabetes mellitus ([place]) 01/18/2015  . Morbid obesity ([place]) BMI 35 associated with htn, hyperlipidemia, arthritis, T2DM 07/03/2014  . Vitamin D deficiency 03/31/2013  . Medication management 03/31/2013  . DJD   . Glaucoma   . Hypothyroidism   . Hypertension   . Hyperlipidemia associated with type 2 diabetes mellitus ([place])   . GERD (gastroesophageal reflux disease)   . Tear of lateral meniscus of knee, current 03/27/2011    Screening Tests Immunization History  Administered Date(s) Administered  . Influenza, High Dose Seasonal PF 11/25/2016, 11/24/2017, 10/17/2018, 11/14/2018  . Influenza-Unspecified 12/09/2013, 11/14/2015, 11/23/2016  . PFIZER(Purple Top)SARS-COV-2 Vaccination 05/12/2019, 06/01/2019, 12/16/2019  . Pneumococcal Conjugate-13 04/21/2015  . Pneumococcal Polysaccharide-23 05/29/2008  . Td 04/26/2007    Preventative care: Last colonoscopy: Dr. Fuller Plan on 11/2017 Last mammogram: 08/2019, due for 2022 Last pap smear/pelvic exam: remote, declines DEXA: 2017, normal, declines today, will get next year US carotid 2017  Prior vaccinations: TD or Tdap: 2009, declines today discussed with patient Influenza: 10/2019 Pneumococcal: 2010 Prevnar 13: 2017 Shingles/Zostavax: declines due to price Covid 19: 2/2, 2021, pfizer  Names of Other Physician/Practitioners you  currently use: 1. [facility]- here for primary care 2. Dr. Peter Garter, eye doctor,has appointment coming up, visits every 3-4 months for glacoma, 2019, abstracted for 04/13/2017 diabetic eye exam, updated report requested today  3. Can't remember name, dentist, last visit 2017, has partials, encouraged to schedule follow up  Patient Care Team: Unk Pinto, MD as  PCP - General (Internal Medicine) Camillo Flaming, [place] as Referring Physician (Optometry) Suella Broad, MD as Consulting Physician (Physical Medicine and Rehabilitation) Ladene Artist, MD as Consulting Physician (Gastroenterology) Myrlene Broker, MD as Attending Physician (Urology)   Allergies Allergies  Allergen Reactions  . Meloxicam Swelling    Weight gain    SURGICAL HISTORY She  has a past surgical history that includes Abdominal hysterectomy; Appendectomy; Joint replacement (2007); Bladder suspension; Knee arthroscopy (03/28/2011); and Colonoscopy. FAMILY HISTORY Her family history includes Colon polyps in her sister; Diabetes in her sister; Esophageal cancer in some other family members. SOCIAL HISTORY She  reports that she quit smoking about 24 years ago. She has never used smokeless tobacco. She reports current alcohol use of about 2.0 standard drinks of alcohol per week. She reports that she does not use drugs.  MEDICARE WELLNESS OBJECTIVES: Physical activity: Current Exercise Habits: The patient does not participate in regular exercise at present, Exercise limited by: orthopedic condition(s) Cardiac risk factors: Cardiac Risk Factors include: advanced age (>62mn, >>35women);dyslipidemia;diabetes mellitus;hypertension;obesity (BMI >30kg/m2);sedentary lifestyle Depression/mood screen:   [facility]06[facility]  Decreased Interest 0  Down, Depressed, Hopeless 0  PHQ - 2 Score 0    ADLs:  In your present state of health, do you have any difficulty performing  the following activities: 04/06/2020 07/18/2019  Hearing? N N  Vision? N N  Difficulty concentrating or making decisions? N N  Walking or climbing stairs? N Y  Comment - R knee pending surgery, no stairs in home, manages slowly  Dressing or bathing? N N  Doing errands, shopping? N N  Preparing Food and eating ? N -  Using the Toilet? N -  In the past six months, have you accidently leaked urine? N -  Do you have problems with loss of bowel control? N -  Managing your Medications? N -  Managing your Finances? N -  Housekeeping or managing your Housekeeping? N -  Some recent data might be hidden     Cognitive Testing  Alert? Yes  Normal Appearance?Yes  Oriented to person? Yes  Place? Yes   Time? Yes  Recall of three objects?  Yes  Can perform simple calculations? Yes  Displays appropriate judgment?Yes  Can read the correct time from a watch face?Yes  EOL planning: Does Patient Have a Medical Advance Directive?: Yes Type of Advance Directive: [place] Does patient want to make changes to medical advance directive?: No - Patient declined [place] of [place]?: No - copy requested   Objective:   Blood pressure 126/80, pulse (!) 57, temperature 98.7 F (37.1 C), height 5' 7"  (1.702 m), weight 223 lb 3.2 oz (101.2 kg), SpO2 96 %. Body mass index is 34.96 kg/m.  General appearance: alert, no distress, WD/WN,  female HEENT: normocephalic, sclerae anicteric, TMs pearly, nares patent, no discharge or erythema, pharynx normal Oral cavity: MMM, no lesions Neck: supple, no lymphadenopathy, no thyromegaly, no masses Heart: RRR, normal S1, S2, no murmurs Lungs: CTA bilaterally, no wheezes, rhonchi, or rales Abdomen: +bs, soft, non tender, non distended, no masses, no hepatomegaly, no splenomegaly Musculoskeletal: nontender, mild effusion and bony enlargement to right knee Extremities: no edema, no cyanosis, no clubbing Pulses: 2+  symmetric, upper and lower extremities, normal cap refill Neurological: alert, oriented x 3, CN2-12 intact, strength normal upper extremities and lower extremities, sensation normal throughout, DTRs 2+ throughout, no cerebellar signs, gait mildly antalgic Psychiatric: normal  affect, behavior normal, pleasant  Breast: defer Gyn: defer Rectal: defer  Medicare Attestation I have personally reviewed: The patient's medical and social history Their use of alcohol, tobacco or illicit drugs Their current medications and supplements The patient's functional ability including ADLs,fall risks, home safety risks, cognitive, and hearing and visual impairment Diet and physical activities Evidence for depression or mood disorders  The patient's weight, height, BMI, and visual acuity have been recorded in the chart.  I have made referrals, counseling, and provided education to the patient based on review of the above and I have provided the patient with a written personalized care plan for preventive services.     Garnet Sierras, NP   04/06/2020

## 2020-04-07 DIAGNOSIS — Z96651 Presence of right artificial knee joint: Secondary | ICD-10-CM | POA: Diagnosis not present

## 2020-04-07 LAB — LIPID PANEL
Cholesterol: 178 mg/dL (ref <200)
HDL: 69 mg/dL (ref >=50)
LDL Cholesterol (Calc): 79 mg/dL (calc)
Non-HDL Cholesterol (Calc): 109 mg/dL (calc) (ref <130)
Total CHOL/HDL Ratio: 2.6 (calc) (ref <5.0)
Triglycerides: 206 mg/dL — ABNORMAL HIGH (ref <150)

## 2020-04-07 LAB — COMPLETE METABOLIC PANEL WITH GFR
AG Ratio: 1.5 (calc) (ref 1.0–2.5)
ALT: 8 U/L (ref 6–29)
AST: 13 U/L (ref 10–35)
Albumin: 4.3 g/dL (ref 3.6–5.1)
Alkaline phosphatase (APISO): 82 U/L (ref 37–153)
BUN/Creatinine Ratio: 15 (calc) (ref 6–22)
BUN: 16 mg/dL (ref 7–25)
CO2: 32 mmol/L (ref 20–32)
Calcium: 10.6 mg/dL — ABNORMAL HIGH (ref 8.6–10.4)
Chloride: 103 mmol/L (ref 98–110)
Creat: 1.05 mg/dL — ABNORMAL HIGH (ref 0.60–0.93)
GFR, Est African American: 58 mL/min/{1.73_m2} — ABNORMAL LOW (ref >=60)
GFR, Est Non African American: 50 mL/min/{1.73_m2} — ABNORMAL LOW (ref >=60)
Globulin: 2.8 g/dL (calc) (ref 1.9–3.7)
Glucose, Bld: 104 mg/dL — ABNORMAL HIGH (ref 65–99)
Potassium: 4.2 mmol/L (ref 3.5–5.3)
Sodium: 143 mmol/L (ref 135–146)
Total Bilirubin: 0.8 mg/dL (ref 0.2–1.2)
Total Protein: 7.1 g/dL (ref 6.1–8.1)

## 2020-04-07 LAB — CBC WITH DIFFERENTIAL/PLATELET
Absolute Monocytes: 441 cells/uL (ref 200–950)
Basophils Absolute: 41 cells/uL (ref 0–200)
Basophils Relative: 0.9 %
Eosinophils Absolute: 140 cells/uL (ref 15–500)
Eosinophils Relative: 3.1 %
HCT: 41.1 % (ref 35.0–45.0)
Hemoglobin: 14 g/dL (ref 11.7–15.5)
Lymphs Abs: 2237 cells/uL (ref 850–3900)
MCH: 26.6 pg — ABNORMAL LOW (ref 27.0–33.0)
MCHC: 34.1 g/dL (ref 32.0–36.0)
MCV: 78 fL — ABNORMAL LOW (ref 80.0–100.0)
MPV: 10.4 fL (ref 7.5–12.5)
Monocytes Relative: 9.8 %
Neutro Abs: 1643 cells/uL (ref 1500–7800)
Neutrophils Relative %: 36.5 %
Platelets: 237 10*3/uL (ref 140–400)
RBC: 5.27 10*6/uL — ABNORMAL HIGH (ref 3.80–5.10)
RDW: 15.2 % — ABNORMAL HIGH (ref 11.0–15.0)
Total Lymphocyte: 49.7 %
WBC: 4.5 10*3/uL (ref 3.8–10.8)

## 2020-04-07 LAB — TSH: TSH: 5.44 mIU/L — ABNORMAL HIGH (ref 0.40–4.50)

## 2020-04-08 ENCOUNTER — Other Ambulatory Visit: Payer: Self-pay | Admitting: Adult Health Nurse Practitioner

## 2020-04-08 DIAGNOSIS — E039 Hypothyroidism, unspecified: Secondary | ICD-10-CM

## 2020-04-13 ENCOUNTER — Other Ambulatory Visit: Payer: Self-pay | Admitting: Internal Medicine

## 2020-04-13 MED ORDER — FUROSEMIDE 40 MG PO TABS: ORAL_TABLET | ORAL | 0 refills | Status: DC

## 2020-04-23 ENCOUNTER — Other Ambulatory Visit: Payer: Self-pay | Admitting: Internal Medicine

## 2020-04-23 DIAGNOSIS — E1169 Type 2 diabetes mellitus with other specified complication: Secondary | ICD-10-CM

## 2020-04-23 DIAGNOSIS — E1142 Type 2 diabetes mellitus with diabetic polyneuropathy: Secondary | ICD-10-CM

## 2020-04-23 DIAGNOSIS — E785 Hyperlipidemia, unspecified: Secondary | ICD-10-CM

## 2020-04-23 DIAGNOSIS — K219 Gastro-esophageal reflux disease without esophagitis: Secondary | ICD-10-CM

## 2020-04-23 MED ORDER — ROSUVASTATIN CALCIUM 5 MG PO TABS
ORAL_TABLET | ORAL | 0 refills | Status: DC
Start: 2020-04-23 — End: 2020-04-28

## 2020-04-23 MED ORDER — OMEPRAZOLE 20 MG PO CPDR: DELAYED_RELEASE_CAPSULE | ORAL | 0 refills | Status: DC

## 2020-04-23 MED ORDER — GABAPENTIN 600 MG PO TABS
ORAL_TABLET | ORAL | 1 refills | Status: DC
Start: 2020-04-23 — End: 2020-04-28

## 2020-04-28 ENCOUNTER — Other Ambulatory Visit: Payer: Self-pay

## 2020-04-28 DIAGNOSIS — E1169 Type 2 diabetes mellitus with other specified complication: Secondary | ICD-10-CM

## 2020-04-28 DIAGNOSIS — E1142 Type 2 diabetes mellitus with diabetic polyneuropathy: Secondary | ICD-10-CM

## 2020-04-28 MED ORDER — ROSUVASTATIN CALCIUM 5 MG PO TABS: ORAL_TABLET | ORAL | 1 refills | Status: DC

## 2020-04-28 MED ORDER — GABAPENTIN 600 MG PO TABS: ORAL_TABLET | ORAL | 1 refills | Status: DC

## 2020-04-28 NOTE — Telephone Encounter (Signed)
Refill on GABAPENTIN & ROSUVASTATIN sent to [facility]   ALPRAZALAM---needs refill

## 2020-04-29 MED ORDER — ALPRAZOLAM 1 MG PO TABS: ORAL_TABLET | ORAL | 0 refills | Status: DC

## 2020-05-04 ENCOUNTER — Other Ambulatory Visit: Payer: Medicare Other

## 2020-05-04 ENCOUNTER — Other Ambulatory Visit: Payer: Self-pay

## 2020-05-04 DIAGNOSIS — E039 Hypothyroidism, unspecified: Secondary | ICD-10-CM

## 2020-05-04 MED ORDER — BISOPROLOL-HYDROCHLOROTHIAZIDE 10-6.25 MG PO TABS: ORAL_TABLET | ORAL | 1 refills | Status: DC

## 2020-05-04 MED ORDER — LEVOTHYROXINE SODIUM 100 MCG PO TABS: ORAL_TABLET | ORAL | 1 refills | Status: DC

## 2020-05-04 NOTE — Telephone Encounter (Signed)
Patient reported for LAB visit for TSH & has requested refills on [place].

## 2020-05-05 LAB — TSH: TSH: 3.51 mIU/L (ref 0.40–4.50)

## 2020-05-19 ENCOUNTER — Other Ambulatory Visit: Payer: Self-pay

## 2020-05-19 DIAGNOSIS — K219 Gastro-esophageal reflux disease without esophagitis: Secondary | ICD-10-CM

## 2020-05-19 MED ORDER — OMEPRAZOLE 20 MG PO CPDR: DELAYED_RELEASE_CAPSULE | ORAL | 1 refills | Status: DC

## 2020-05-19 NOTE — Telephone Encounter (Signed)
REFILL on OMEPRAZOLE.

## 2020-05-25 DIAGNOSIS — H401131 Primary open-angle glaucoma, bilateral, mild stage: Secondary | ICD-10-CM | POA: Diagnosis not present

## 2020-05-25 DIAGNOSIS — H16143 Punctate keratitis, bilateral: Secondary | ICD-10-CM | POA: Diagnosis not present

## 2020-06-01 IMAGING — MG DIGITAL SCREENING BILATERAL MAMMOGRAM WITH TOMO AND CAD
8 series · 8 of 24 positions shown · non-contrast
Comparison: Previous exam(s).

CLINICAL DATA: Screening.

EXAM:
DIGITAL SCREENING BILATERAL MAMMOGRAM WITH TOMO AND CAD

[L MLO synth-2D]
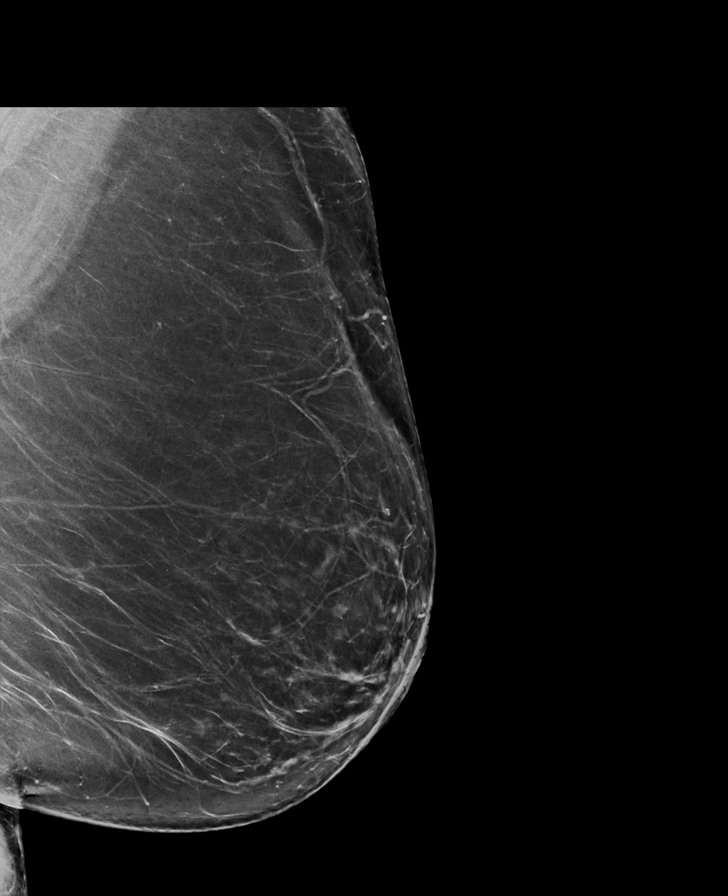

[R MLO synth-2D]
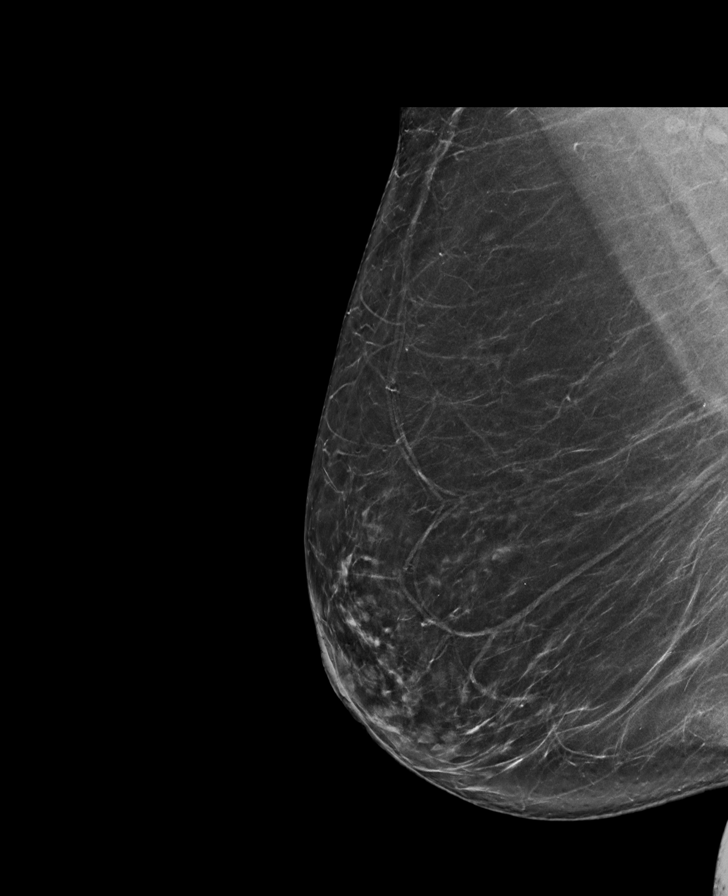

[R CC synth-2D]
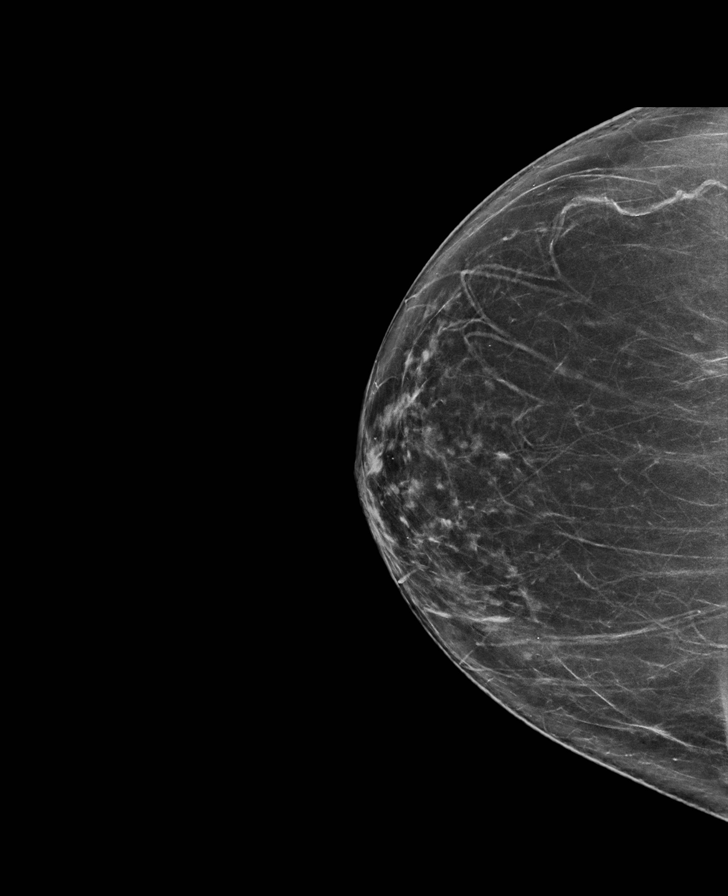

[L CC synth-2D]
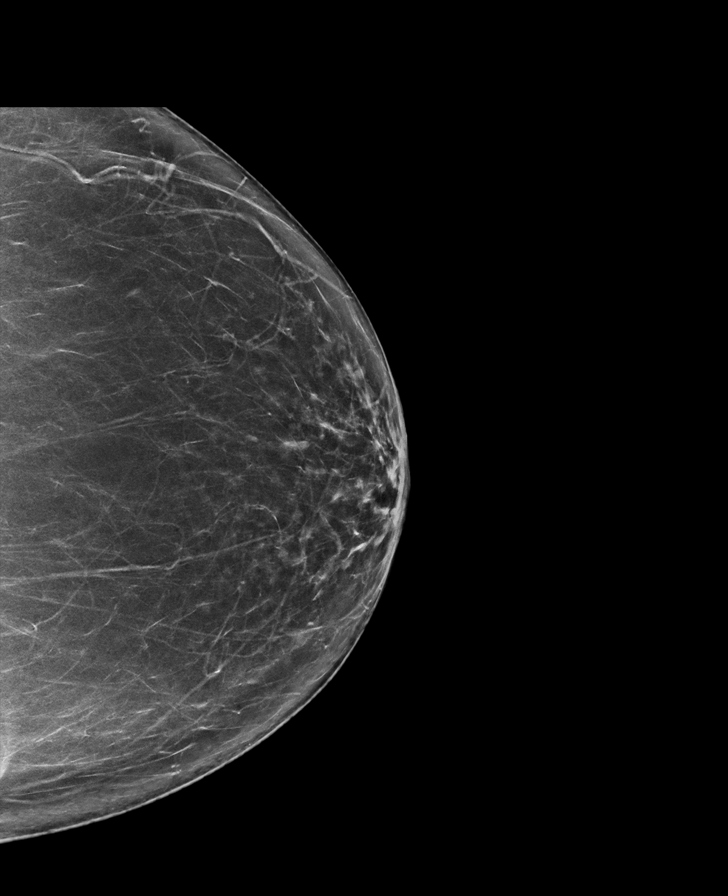

[R CC tomo · tomo slice 37/73.0]
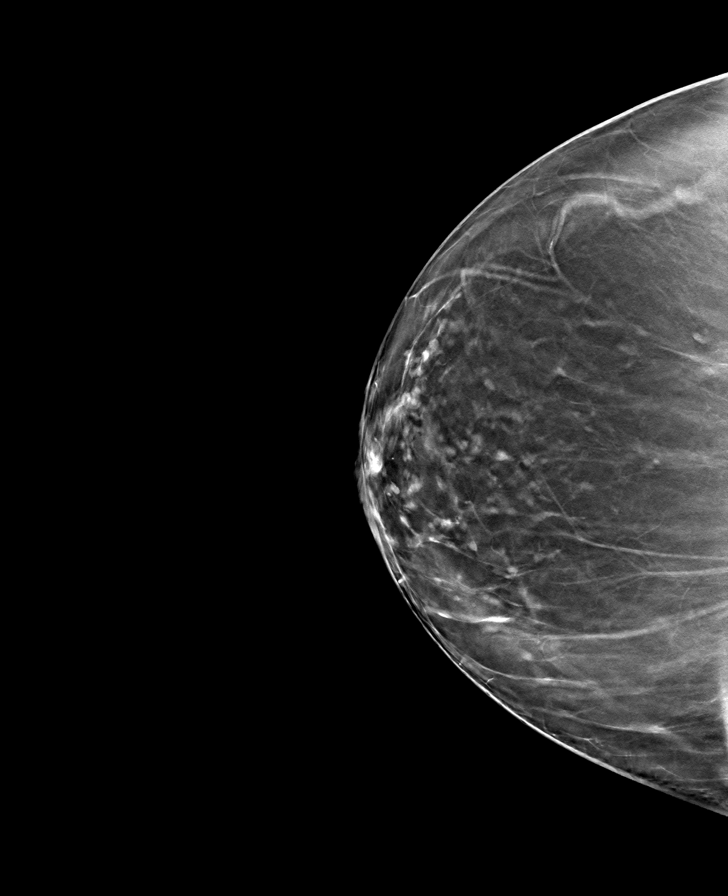

[L MLO tomo · tomo slice 43/84.0]
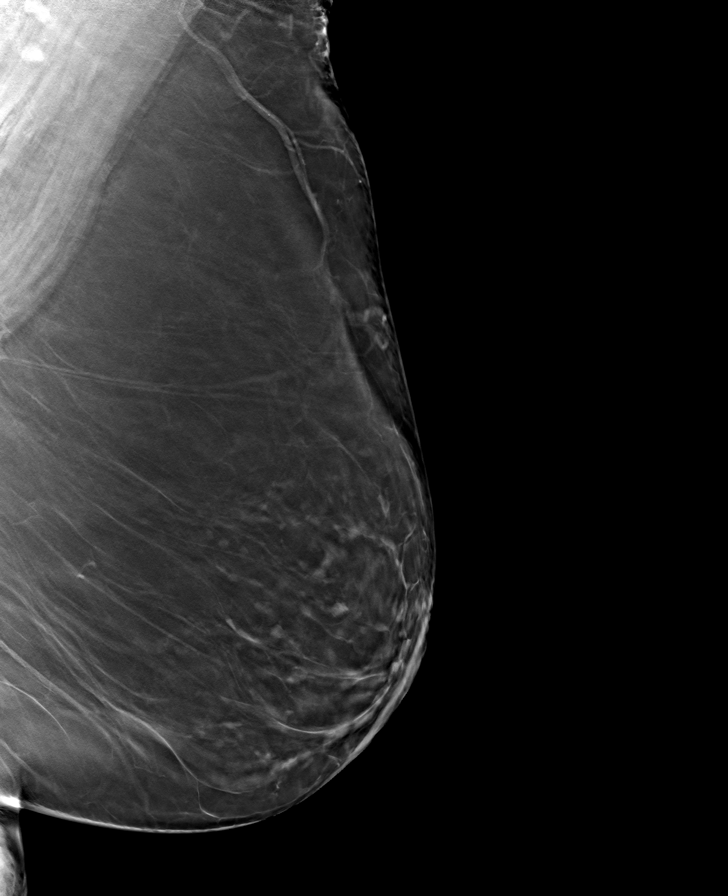

[L CC tomo · tomo slice 37/72.0]
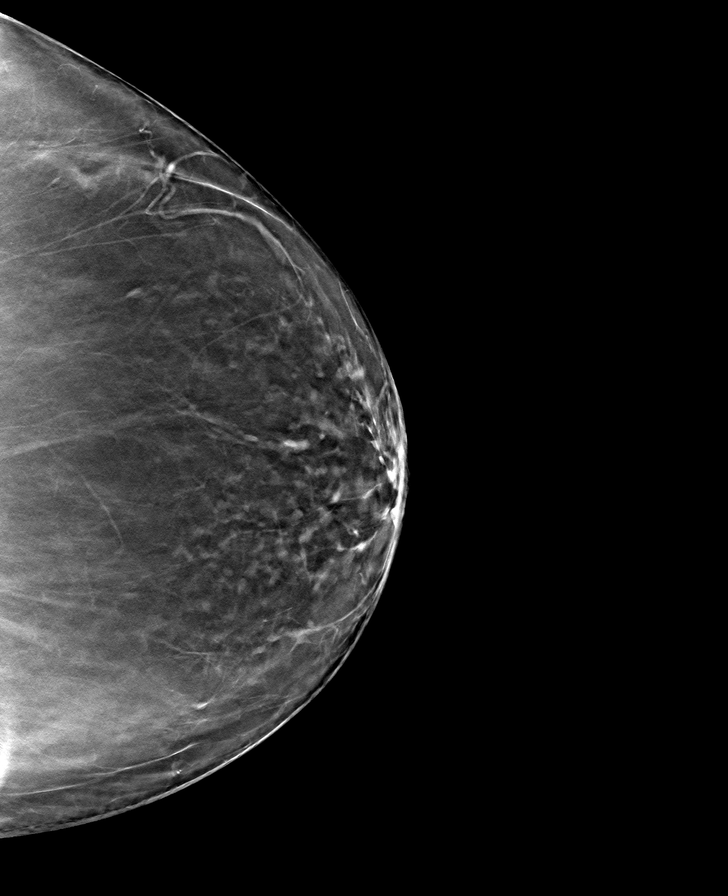

[R MLO tomo · tomo slice 42/83.0]
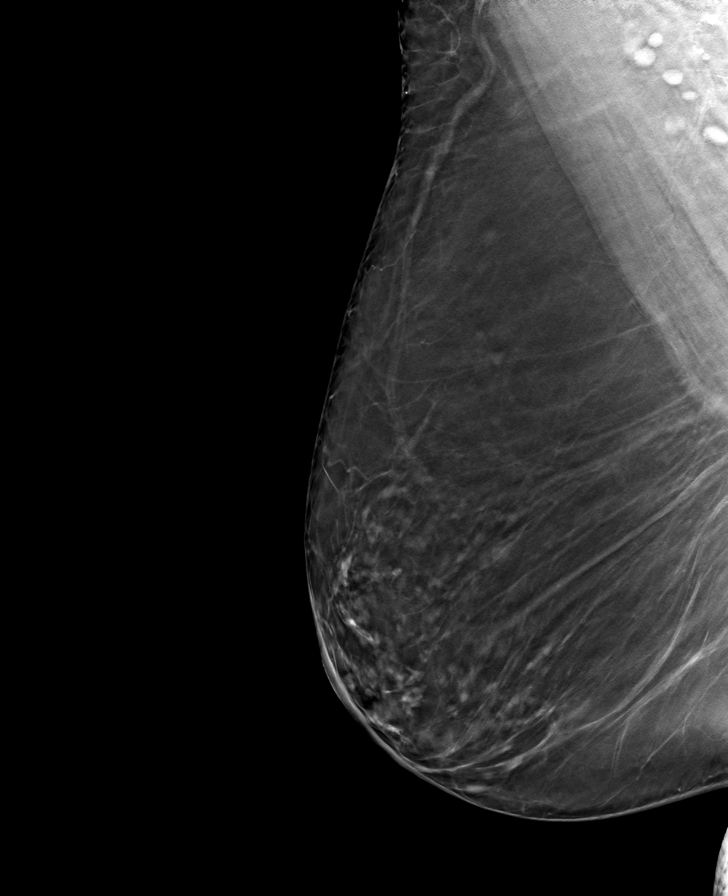

[8 of 24 positions shown; findings below may reference images not displayed]

ACR Breast Density Category b: There are scattered areas of
fibroglandular density.
FINDINGS: There are no findings suspicious for malignancy. Images were
processed with CAD.
IMPRESSION: No mammographic evidence of malignancy. A result letter of this
screening mammogram will be mailed directly to the patient.

RECOMMENDATION:
Screening mammogram in one year. (Code:[id number])

BI-RADS CATEGORY  1: Negative.

## 2020-06-08 DIAGNOSIS — H16143 Punctate keratitis, bilateral: Secondary | ICD-10-CM | POA: Diagnosis not present

## 2020-07-08 NOTE — Patient Instructions (Signed)

## 2020-07-08 NOTE — Progress Notes (Addendum)
Annual Screening/Preventative Visit & Comprehensive Evaluation &  Examination  Future Appointments  Date Time Provider [place]  07/09/2020 11:00 AM Unk Pinto, MD [facility] None  04/08/2021  2:30 PM Garnet Sierras, NP [facility] None  07/12/2021 11:00 AM Unk Pinto, MD [facility] None        This very nice 80 y.o. DBF  presents for a Screening /Preventative Visit & comprehensive evaluation and management of multiple medical co-morbidities.  Patient has been followed for HTN, HLD, T2_DM    and Vitamin D Deficiency. Patient has Gout controlled on her meds.        HTN predates circa 1997. Patient's BP has been controlled at home and patient denies any cardiac symptoms as chest pain, palpitations, shortness of breath, dizziness or ankle swelling. Today's BP -  130/82  is at goal .       Patient's hyperlipidemia is controlled with diet and medications. Patient denies myalgias or other medication SE's. Last lipids were at goal albeit elevated  Trig's:  Lab Results  Component Value Date   CHOL 178 04/06/2020   HDL 69 04/06/2020   LDLCALC 79 04/06/2020   TRIG 206 (H) 04/06/2020   CHOLHDL 2.6 04/06/2020         Patient has moderate Obesity (BMI 35+) and  DIET CONTROLLED T2_NIDDM (2013) w/CKD3a (GFR 58)  & with  PSN.  Patient denies reactive hypoglycemic symptoms, visual blurring, diabetic polys or paresthesias. Last A1c was not at goal:  Lab Results  Component Value Date   HGBA1C 6.4 (H) 11/18/2019         Finally, patient has history of Vitamin D Deficiency ("26" /2008) and last Vitamin D was at goal:  Lab Results  Component Value Date   VD25OH 79 11/18/2019     Current Outpatient Medications on File Prior to Visit  Medication Sig   ALPRAZolam 1 MG tab Take 1/2-1 tablet 1 to 2 /day ONLY if needed for sleep    aspirin 81 MG tab Takes two tablets daily   bisoprolol-hctz  10-6.25 MG tab TAKE 1 TABLET EVERY DAY    VITAMIN D 5000 u Take 1 tablet daily.    furosemide 40 MG tab Take  1 tablet  Daily   gabapentin  600 MG tab Take  1/2 to 1 tablet  2 to 3 x /Daily  as needed for Pain   XALATAN 0.005 % ophth  soln Place 1 drop into both eyes daily.    levothyroxine 100 MCG tab Take 1 tablet Daily   Magnesium 500 MG CAPS Take 2 capsules  daily.    Omega-3 Fatty Acids  Take 1 capsule daily.   omeprazole 20 MG cap Take  1 capsule  2 x /day    rosuvastatin  5 MG tabl Take  1 tablet  3 x /week  on Mon Wed & Fri  for Cholesterol   timolol (TIMOPTIC) 0.5 % ophth  soln Place 1 drop into both eyes 2 (two) times daily.      Allergies  Allergen Reactions   Meloxicam Swelling    Weight gain     Past Medical History:  Diagnosis Date   Anal fissure    Arthritis    Carotid stenosis, <50%, bilateral  12/20/2018   Colon polyps    hyperplastic   Diverticulosis    GERD (gastroesophageal reflux disease)    Glaucoma    Hemorrhoids    Hyperlipidemia    Hypertension    Hypothyroidism    Prediabetes  Health Maintenance  Topic Date Due   Hepatitis C Screening  Never done   OPHTHALMOLOGY EXAM  04/13/2018   FOOT EXAM  04/02/2020   HEMOGLOBIN A1C  05/17/2020   TETANUS/TDAP  07/17/2020 (Originally 04/25/2017)   MAMMOGRAM  09/10/2020   INFLUENZA VACCINE  09/28/2020   DEXA SCAN  Completed   COVID-19 Vaccine  Completed   PNA vac Low Risk Adult  Completed   HPV VACCINES  Aged Out     Immunization History  Administered Date(s) Administered   Influenza, High Dose  11/25/2016, 11/24/2017, 10/17/2018, 11/14/2018   Influenza-m 12/09/2013, 11/14/2015, 11/23/2016   PFIZER SARS-COV-2 Vacc  05/12/2019, 06/01/2019, 12/16/2019   Pneumococcal -13 04/21/2015   Pneumococcal -23 05/29/2008   Td 04/26/2007    Last Colon - 01/07/2018 - Dr Fuller Plan - Negative and recc no f/u due to age   Last MGM - 09/11/2019   Past Surgical History:  Procedure Laterality Date   ABDOMINAL HYSTERECTOMY     APPENDECTOMY     BLADDER SUSPENSION     COLONOSCOPY      JOINT REPLACEMENT  2007   rt hip replacement   KNEE ARTHROSCOPY  03/28/2011   Procedure: ARTHROSCOPY KNEE;  Surgeon: Kerin Salen, MD;  Location: [facility];  Service: Orthopedics;  Laterality: Right;  with partial lateral meniscectomy, Debridement of Chondromalacia     Family History  Problem Relation Age of Onset   Colon polyps Sister    Diabetes Sister    Esophageal cancer Other    Colon cancer Neg Hx    Rectal cancer Neg Hx    Stomach cancer Neg Hx    Breast cancer Neg Hx      Social History   Tobacco Use   Smoking status: Former Smoker    Quit date: 03/22/1996    Years since quitting: 24.3   Smokeless tobacco: Never Used  Vaping Use   Vaping Use: Never used   Alcohol use: Yes    Alcohol/week: 2.0 standard drinks    Types: 2 drink(s) per week    Comment: occ   Drug use: No      ROS Constitutional: Denies fever, chills, weight loss/gain, headaches, insomnia,  night sweats, and change in appetite. Does c/o fatigue. Eyes: Denies redness, blurred vision, diplopia, discharge, itchy, watery eyes.  ENT: Denies discharge, congestion, post nasal drip, epistaxis, sore throat, earache, hearing loss, dental pain, Tinnitus, Vertigo, Sinus pain, snoring.  Cardio: Denies chest pain, palpitations, irregular heartbeat, syncope, dyspnea, diaphoresis, orthopnea, PND, claudication, edema Respiratory: denies cough, dyspnea, DOE, pleurisy, hoarseness, laryngitis, wheezing.  Gastrointestinal: Denies dysphagia, heartburn, reflux, water brash, pain, cramps, nausea, vomiting, bloating, diarrhea, constipation, hematemesis, melena, hematochezia, jaundice, hemorrhoids Genitourinary: Denies dysuria, frequency, urgency, nocturia, hesitancy, discharge, hematuria, flank pain Breast: Breast lumps, nipple discharge, bleeding.  Musculoskeletal: Denies arthralgia, myalgia, stiffness, Jt. Swelling, pain, limp, and strain/sprain. Denies falls. Skin: Denies puritis, rash, hives, warts, acne,  eczema, changing in skin lesion Neuro: No weakness, tremor, incoordination, spasms, paresthesia, pain Psychiatric: Denies confusion, memory loss, sensory loss. Denies Depression. Endocrine: Denies change in weight, skin, hair change, nocturia, and paresthesia, diabetic polys, visual blurring, hyper / hypo glycemic episodes.  Heme/Lymph: No excessive bleeding, bruising, enlarged lymph nodes.  Physical Exam  BP 130/82   Pulse (!) 51   Temp (!) 97.2 F (36.2 C)   Resp 16   Ht 5' 7.5" (1.715 m)   Wt 227 lb 3.2 oz (103.1 kg)   SpO2 98%   BMI 35.06 kg/m  General Appearance: Well nourished, well groomed and in no apparent distress.  Eyes: PERRLA, EOMs, conjunctiva no swelling or erythema, normal fundi and vessels. Sinuses: No frontal/maxillary tenderness ENT/Mouth: EACs patent / TMs  nl. Nares clear without erythema, swelling, mucoid exudates. Oral hygiene is good. No erythema, swelling, or exudate. Tongue normal, non-obstructing. Tonsils not swollen or erythematous. Hearing normal.  Neck: Supple, thyroid not palpable. No bruits, nodes or JVD. Respiratory: Respiratory effort normal.  BS equal and clear bilateral without rales, rhonci, wheezing or stridor. Cardio: Heart sounds are normal with regular rate and rhythm and no murmurs, rubs or gallops. Peripheral pulses are normal and equal bilaterally without edema. No aortic or femoral bruits. Chest: symmetric with normal excursions and percussion. Breasts: Symmetric, without lumps, nipple discharge, retractions, or fibrocystic changes.  Abdomen: Flat, soft with bowel sounds active. Nontender, no guarding, rebound, hernias, masses, or organomegaly.  Lymphatics: Non tender without lymphadenopathy.  Genitourinary:  Musculoskeletal: Full ROM all peripheral extremities, joint stability, 5/5 strength, and normal gait. Skin: Warm and dry without rashes, lesions, cyanosis, clubbing or  ecchymosis.  Neuro: Cranial nerves intact, reflexes equal  bilaterally. Normal muscle tone, no cerebellar symptoms. Sensation intact.  Pysch: Alert and oriented X 3, normal affect, Insight and Judgment appropriate.    Assessment and Plan  1. Annual Preventative Screening Examination  2. Essential hypertension  - Urinalysis, Routine w reflex microscopic - EKG 12-Lead - Microalbumin / creatinine urine ratio - CBC with Differential/Platelet - COMPLETE METABOLIC PANEL WITH GFR - Magnesium - TSH  3. Hyperlipidemia associated with type 2 diabetes mellitus ([place])  - EKG 12-Lead - Lipid panel - TSH  4. Controlled type 2 diabetes mellitus with stage 3 chronic kidney  disease, without long-term current use of insulin (HCC)  - Urinalysis, Routine w reflex microscopic - EKG 12-Lead - [facility] DIABETES FOOT EXAM - LOW EXTREMITY NEUR EXAM DOCUM - PTH, intact and calcium - Hemoglobin A1c - Insulin, random  5. Vitamin D deficiency  - VITAMIN D 25 Hydroxy   6. Gout  - Uric acid  7. Hypothyroidism  - TSH  8. Gastroesophageal reflux disease, unspecified whether esophagitis present  - CBC with Differential/Platelet  9. Diabetic peripheral neuropathy (HCC)  - HM DIABETES FOOT EXAM - LOW EXTREMITY NEUR EXAM DOCUM  10. Morbid obesity (HCC) BMI 35 associated with htn, hyperlipidemia, arthritis, T2DM  - phentermine 37.5 MG tablet; Take 1/2 to 1 tablet every Morning for Dieting & Weight Loss  Dispense: 90 tablet; Refill: 1  - topiramate 50 MG tablet; Take 1/2 to 1 tablet 2 x /day at Suppertime & Bedtime for Dieting & Weight Loss  Dispense: 180 tablet; Refill: 1  11. Screening for colorectal cancer  - POC Hemoccult Bld/Stl   12. Screening for ischemic heart disease  - EKG 12-Lead  13. Former smoker  - EKG 12-Lead  14. Medication management  - Urinalysis, Routine w reflex microscopic - Microalbumin / creatinine urine ratio - CBC with Differential/Platelet - COMPLETE METABOLIC PANEL WITH GFR - Magnesium - Lipid panel - TSH -  Hemoglobin A1c - VITAMIN D 25 Hydroxy - VITAMIN B 12 ; Take 1 tablet daily. - Multiple Vitamins-Minerals ; Takes 1 gummy daily          Patient was counseled in prudent diet to achieve/maintain BMI less than 25 for weight control, BP monitoring, regular exercise and medications. Discussed med's effects and SE's. Screening labs and tests as requested with regular follow-up as recommended. Over 40 minutes of exam, counseling, chart  review and high complex critical decision making was performed.   Kirtland Bouchard, MD

## 2020-07-09 ENCOUNTER — Encounter: Payer: Self-pay | Admitting: Internal Medicine

## 2020-07-09 ENCOUNTER — Other Ambulatory Visit: Payer: Self-pay

## 2020-07-09 ENCOUNTER — Ambulatory Visit (INDEPENDENT_AMBULATORY_CARE_PROVIDER_SITE_OTHER): Payer: Medicare Other | Admitting: Internal Medicine

## 2020-07-09 VITALS — BP 130/82 | HR 51 | Temp 97.2°F | Resp 16 | Ht 67.5 in | Wt 227.2 lb

## 2020-07-09 DIAGNOSIS — E785 Hyperlipidemia, unspecified: Secondary | ICD-10-CM | POA: Diagnosis not present

## 2020-07-09 DIAGNOSIS — Z87891 Personal history of nicotine dependence: Secondary | ICD-10-CM | POA: Diagnosis not present

## 2020-07-09 DIAGNOSIS — Z0000 Encounter for general adult medical examination without abnormal findings: Secondary | ICD-10-CM

## 2020-07-09 DIAGNOSIS — Z0001 Encounter for general adult medical examination with abnormal findings: Secondary | ICD-10-CM

## 2020-07-09 DIAGNOSIS — M109 Gout, unspecified: Secondary | ICD-10-CM

## 2020-07-09 DIAGNOSIS — E1169 Type 2 diabetes mellitus with other specified complication: Secondary | ICD-10-CM

## 2020-07-09 DIAGNOSIS — E559 Vitamin D deficiency, unspecified: Secondary | ICD-10-CM | POA: Diagnosis not present

## 2020-07-09 DIAGNOSIS — E1122 Type 2 diabetes mellitus with diabetic chronic kidney disease: Secondary | ICD-10-CM

## 2020-07-09 DIAGNOSIS — Z79899 Other long term (current) drug therapy: Secondary | ICD-10-CM

## 2020-07-09 DIAGNOSIS — Z136 Encounter for screening for cardiovascular disorders: Secondary | ICD-10-CM

## 2020-07-09 DIAGNOSIS — I10 Essential (primary) hypertension: Secondary | ICD-10-CM

## 2020-07-09 DIAGNOSIS — N183 Chronic kidney disease, stage 3 unspecified: Secondary | ICD-10-CM

## 2020-07-09 DIAGNOSIS — E039 Hypothyroidism, unspecified: Secondary | ICD-10-CM

## 2020-07-09 DIAGNOSIS — K219 Gastro-esophageal reflux disease without esophagitis: Secondary | ICD-10-CM

## 2020-07-09 DIAGNOSIS — Z1211 Encounter for screening for malignant neoplasm of colon: Secondary | ICD-10-CM

## 2020-07-09 DIAGNOSIS — E1142 Type 2 diabetes mellitus with diabetic polyneuropathy: Secondary | ICD-10-CM

## 2020-07-09 MED ORDER — TOPIRAMATE 50 MG PO TABS: ORAL_TABLET | ORAL | 1 refills | Status: DC

## 2020-07-09 MED ORDER — PHENTERMINE HCL 37.5 MG PO TABS: ORAL_TABLET | ORAL | 1 refills | Status: DC

## 2020-07-10 LAB — TSH: TSH: 3.32 mIU/L (ref 0.40–4.50)

## 2020-07-10 LAB — COMPLETE METABOLIC PANEL WITH GFR
AG Ratio: 1.8 (calc) (ref 1.0–2.5)
ALT: 10 U/L (ref 6–29)
AST: 12 U/L (ref 10–35)
Albumin: 4.6 g/dL (ref 3.6–5.1)
Alkaline phosphatase (APISO): 71 U/L (ref 37–153)
BUN: 14 mg/dL (ref 7–25)
CO2: 28 mmol/L (ref 20–32)
Calcium: 10.5 mg/dL — ABNORMAL HIGH (ref 8.6–10.4)
Chloride: 102 mmol/L (ref 98–110)
Creat: 0.81 mg/dL (ref 0.60–0.93)
GFR, Est African American: 80 mL/min/{1.73_m2} (ref >=60)
GFR, Est Non African American: 69 mL/min/{1.73_m2} (ref >=60)
Globulin: 2.6 g/dL (calc) (ref 1.9–3.7)
Glucose, Bld: 141 mg/dL — ABNORMAL HIGH (ref 65–99)
Potassium: 4.1 mmol/L (ref 3.5–5.3)
Sodium: 140 mmol/L (ref 135–146)
Total Bilirubin: 0.8 mg/dL (ref 0.2–1.2)
Total Protein: 7.2 g/dL (ref 6.1–8.1)

## 2020-07-10 LAB — URINALYSIS, ROUTINE W REFLEX MICROSCOPIC
Bilirubin Urine: NEGATIVE
Glucose, UA: NEGATIVE
Hgb urine dipstick: NEGATIVE
Ketones, ur: NEGATIVE
Leukocytes,Ua: NEGATIVE
Nitrite: NEGATIVE
Protein, ur: NEGATIVE
Specific Gravity, Urine: 1.007 (ref 1.001–1.035)
pH: 6 (ref 5.0–8.0)

## 2020-07-10 LAB — PTH, INTACT AND CALCIUM
Calcium: 10.5 mg/dL — ABNORMAL HIGH (ref 8.6–10.4)
PTH: 51 pg/mL (ref 16–77)

## 2020-07-10 LAB — LIPID PANEL
Cholesterol: 154 mg/dL (ref <200)
HDL: 73 mg/dL (ref >=50)
LDL Cholesterol (Calc): 65 mg/dL (calc)
Non-HDL Cholesterol (Calc): 81 mg/dL (calc) (ref <130)
Total CHOL/HDL Ratio: 2.1 (calc) (ref <5.0)
Triglycerides: 78 mg/dL (ref <150)

## 2020-07-10 LAB — CBC WITH DIFFERENTIAL/PLATELET
Absolute Monocytes: 516 cells/uL (ref 200–950)
Basophils Absolute: 39 cells/uL (ref 0–200)
Basophils Relative: 0.9 %
Eosinophils Absolute: 133 cells/uL (ref 15–500)
Eosinophils Relative: 3.1 %
HCT: 39.3 % (ref 35.0–45.0)
Hemoglobin: 13 g/dL (ref 11.7–15.5)
Lymphs Abs: 1957 cells/uL (ref 850–3900)
MCH: 27.3 pg (ref 27.0–33.0)
MCHC: 33.1 g/dL (ref 32.0–36.0)
MCV: 82.4 fL (ref 80.0–100.0)
MPV: 10.1 fL (ref 7.5–12.5)
Monocytes Relative: 12 %
Neutro Abs: 1656 cells/uL (ref 1500–7800)
Neutrophils Relative %: 38.5 %
Platelets: 220 10*3/uL (ref 140–400)
RBC: 4.77 10*6/uL (ref 3.80–5.10)
RDW: 13.8 % (ref 11.0–15.0)
Total Lymphocyte: 45.5 %
WBC: 4.3 10*3/uL (ref 3.8–10.8)

## 2020-07-10 LAB — HEMOGLOBIN A1C
Hgb A1c MFr Bld: 6.7 % of total Hgb — ABNORMAL HIGH (ref <5.7)
Mean Plasma Glucose: 146 mg/dL
eAG (mmol/L): 8.1 mmol/L

## 2020-07-10 LAB — MICROALBUMIN / CREATININE URINE RATIO
Creatinine, Urine: 16 mg/dL — ABNORMAL LOW (ref 20–275)
Microalb Creat Ratio: 31 mcg/mg creat — ABNORMAL HIGH (ref <30)
Microalb, Ur: 0.5 mg/dL

## 2020-07-10 LAB — VITAMIN D 25 HYDROXY (VIT D DEFICIENCY, FRACTURES): Vit D, 25-Hydroxy: 81 ng/mL (ref 30–100)

## 2020-07-10 LAB — URIC ACID: Uric Acid, Serum: 5.7 mg/dL (ref 2.5–7.0)

## 2020-07-10 LAB — MAGNESIUM: Magnesium: 2 mg/dL (ref 1.5–2.5)

## 2020-07-10 LAB — INSULIN, RANDOM: Insulin: 23.2 u[IU]/mL — ABNORMAL HIGH

## 2020-07-11 NOTE — Progress Notes (Signed)
============================================================ ============================================================  -    PTH is a hormone that regulates calcium balance & it is Normal  ============================================================ ============================================================  -  Total Chol = 154      and        LDL Chol = 65 -      Both  Excellent   - Very low risk for Heart Attack  / Stroke ============================================================ ============================================================  -  A1c = 6.7% - is too high          (Ideal or Goal is less than 5.7%)   - Getting very close to needing to start Diabetic meds UNLESS                                       get on a better diet and lose about 50 or more pounds ! ============================================================ ============================================================  -  Vitamin D = 81 - Excellent  ! ============================================================ ============================================================  -  All Else - CBC - Kidneys - Electrolytes - Liver - Magnesium & Thyroid    - all  Normal / OK ============================================================ ============================================================

## 2020-07-16 DIAGNOSIS — H16143 Punctate keratitis, bilateral: Secondary | ICD-10-CM | POA: Diagnosis not present

## 2020-07-19 ENCOUNTER — Other Ambulatory Visit: Payer: Self-pay | Admitting: Internal Medicine

## 2020-08-04 ENCOUNTER — Other Ambulatory Visit: Payer: Self-pay

## 2020-08-04 DIAGNOSIS — Z1211 Encounter for screening for malignant neoplasm of colon: Secondary | ICD-10-CM

## 2020-08-04 LAB — POC HEMOCCULT BLD/STL (HOME/3-CARD/SCREEN)
Card #2 Fecal Occult Blod, POC: NEGATIVE
Card #3 Fecal Occult Blood, POC: NEGATIVE
Fecal Occult Blood, POC: NEGATIVE

## 2020-08-10 DIAGNOSIS — Z1211 Encounter for screening for malignant neoplasm of colon: Secondary | ICD-10-CM

## 2020-08-10 DIAGNOSIS — Z1212 Encounter for screening for malignant neoplasm of rectum: Secondary | ICD-10-CM

## 2020-08-11 DIAGNOSIS — H16143 Punctate keratitis, bilateral: Secondary | ICD-10-CM | POA: Diagnosis not present

## 2020-08-14 ENCOUNTER — Other Ambulatory Visit: Payer: Self-pay | Admitting: Internal Medicine

## 2020-08-14 DIAGNOSIS — Z1231 Encounter for screening mammogram for malignant neoplasm of breast: Secondary | ICD-10-CM

## 2020-08-24 ENCOUNTER — Other Ambulatory Visit: Payer: Self-pay | Admitting: Adult Health Nurse Practitioner

## 2020-08-24 DIAGNOSIS — E1142 Type 2 diabetes mellitus with diabetic polyneuropathy: Secondary | ICD-10-CM

## 2020-08-24 DIAGNOSIS — E785 Hyperlipidemia, unspecified: Secondary | ICD-10-CM

## 2020-09-14 ENCOUNTER — Other Ambulatory Visit: Payer: Self-pay | Admitting: Internal Medicine

## 2020-09-14 DIAGNOSIS — K219 Gastro-esophageal reflux disease without esophagitis: Secondary | ICD-10-CM

## 2020-09-15 ENCOUNTER — Ambulatory Visit
Admission: RE | Admit: 2020-09-15 | Discharge: 2020-09-15 | Disposition: A | Discharge status: Home / Self Care | Payer: Medicare HMO | Source: Ambulatory Visit | Attending: Internal Medicine | Admitting: Internal Medicine

## 2020-09-15 ENCOUNTER — Other Ambulatory Visit: Payer: Self-pay

## 2020-09-15 ENCOUNTER — Other Ambulatory Visit: Payer: Self-pay | Admitting: Internal Medicine

## 2020-09-15 DIAGNOSIS — Z1231 Encounter for screening mammogram for malignant neoplasm of breast: Secondary | ICD-10-CM

## 2020-09-15 DIAGNOSIS — K219 Gastro-esophageal reflux disease without esophagitis: Secondary | ICD-10-CM

## 2020-09-15 MED ORDER — OMEPRAZOLE 20 MG PO CPDR: DELAYED_RELEASE_CAPSULE | ORAL | 3 refills | Status: DC

## 2020-09-24 DIAGNOSIS — Z96651 Presence of right artificial knee joint: Secondary | ICD-10-CM | POA: Diagnosis not present

## 2020-09-24 DIAGNOSIS — M67911 Unspecified disorder of synovium and tendon, right shoulder: Secondary | ICD-10-CM | POA: Diagnosis not present

## 2020-09-24 DIAGNOSIS — Z09 Encounter for follow-up examination after completed treatment for conditions other than malignant neoplasm: Secondary | ICD-10-CM | POA: Diagnosis not present

## 2020-10-06 DIAGNOSIS — H16143 Punctate keratitis, bilateral: Secondary | ICD-10-CM | POA: Diagnosis not present

## 2020-10-12 ENCOUNTER — Other Ambulatory Visit: Payer: Self-pay | Admitting: Adult Health Nurse Practitioner

## 2020-10-12 DIAGNOSIS — H16141 Punctate keratitis, right eye: Secondary | ICD-10-CM | POA: Diagnosis not present

## 2020-10-19 DIAGNOSIS — H16141 Punctate keratitis, right eye: Secondary | ICD-10-CM | POA: Diagnosis not present

## 2020-11-16 DIAGNOSIS — H16143 Punctate keratitis, bilateral: Secondary | ICD-10-CM | POA: Diagnosis not present

## 2020-11-30 DIAGNOSIS — H5203 Hypermetropia, bilateral: Secondary | ICD-10-CM | POA: Diagnosis not present

## 2020-11-30 DIAGNOSIS — H16143 Punctate keratitis, bilateral: Secondary | ICD-10-CM | POA: Diagnosis not present

## 2020-11-30 DIAGNOSIS — H25013 Cortical age-related cataract, bilateral: Secondary | ICD-10-CM | POA: Diagnosis not present

## 2020-11-30 DIAGNOSIS — H524 Presbyopia: Secondary | ICD-10-CM | POA: Diagnosis not present

## 2020-11-30 DIAGNOSIS — H401131 Primary open-angle glaucoma, bilateral, mild stage: Secondary | ICD-10-CM | POA: Diagnosis not present

## 2020-11-30 DIAGNOSIS — H52223 Regular astigmatism, bilateral: Secondary | ICD-10-CM | POA: Diagnosis not present

## 2020-12-27 ENCOUNTER — Other Ambulatory Visit: Payer: Self-pay

## 2020-12-27 ENCOUNTER — Emergency Department (HOSPITAL_COMMUNITY): Payer: Medicare Other

## 2020-12-27 ENCOUNTER — Emergency Department (HOSPITAL_COMMUNITY)
Admission: EM | Admit: 2020-12-27 | Discharge: 2020-12-27 | Disposition: A | Discharge status: Home / Self Care | Payer: Medicare Other | Attending: Emergency Medicine | Admitting: Emergency Medicine

## 2020-12-27 ENCOUNTER — Encounter (HOSPITAL_COMMUNITY): Payer: Self-pay | Admitting: Emergency Medicine

## 2020-12-27 DIAGNOSIS — I129 Hypertensive chronic kidney disease with stage 1 through stage 4 chronic kidney disease, or unspecified chronic kidney disease: Secondary | ICD-10-CM | POA: Diagnosis not present

## 2020-12-27 DIAGNOSIS — Z96641 Presence of right artificial hip joint: Secondary | ICD-10-CM | POA: Diagnosis not present

## 2020-12-27 DIAGNOSIS — E039 Hypothyroidism, unspecified: Secondary | ICD-10-CM | POA: Insufficient documentation

## 2020-12-27 DIAGNOSIS — Z87891 Personal history of nicotine dependence: Secondary | ICD-10-CM | POA: Insufficient documentation

## 2020-12-27 DIAGNOSIS — R0789 Other chest pain: Secondary | ICD-10-CM | POA: Insufficient documentation

## 2020-12-27 DIAGNOSIS — J90 Pleural effusion, not elsewhere classified: Secondary | ICD-10-CM

## 2020-12-27 DIAGNOSIS — R079 Chest pain, unspecified: Secondary | ICD-10-CM | POA: Diagnosis not present

## 2020-12-27 DIAGNOSIS — Z79899 Other long term (current) drug therapy: Secondary | ICD-10-CM | POA: Diagnosis not present

## 2020-12-27 DIAGNOSIS — I700 Atherosclerosis of aorta: Secondary | ICD-10-CM | POA: Diagnosis not present

## 2020-12-27 DIAGNOSIS — J918 Pleural effusion in other conditions classified elsewhere: Secondary | ICD-10-CM | POA: Insufficient documentation

## 2020-12-27 DIAGNOSIS — N182 Chronic kidney disease, stage 2 (mild): Secondary | ICD-10-CM | POA: Diagnosis not present

## 2020-12-27 DIAGNOSIS — R911 Solitary pulmonary nodule: Secondary | ICD-10-CM | POA: Diagnosis not present

## 2020-12-27 DIAGNOSIS — R918 Other nonspecific abnormal finding of lung field: Secondary | ICD-10-CM | POA: Diagnosis not present

## 2020-12-27 DIAGNOSIS — Z7982 Long term (current) use of aspirin: Secondary | ICD-10-CM | POA: Insufficient documentation

## 2020-12-27 DIAGNOSIS — E1122 Type 2 diabetes mellitus with diabetic chronic kidney disease: Secondary | ICD-10-CM | POA: Diagnosis not present

## 2020-12-27 LAB — CBC
HCT: 40.1 % (ref 36.0–46.0)
Hemoglobin: 13.5 g/dL (ref 12.0–15.0)
MCH: 27.4 pg (ref 26.0–34.0)
MCHC: 33.7 g/dL (ref 30.0–36.0)
MCV: 81.3 fL (ref 80.0–100.0)
Platelets: 239 10*3/uL (ref 150–400)
RBC: 4.93 MIL/uL (ref 3.87–5.11)
RDW: 13.2 % (ref 11.5–15.5)
WBC: 7 10*3/uL (ref 4.0–10.5)
nRBC: 0 % (ref 0.0–0.2)

## 2020-12-27 LAB — BASIC METABOLIC PANEL
Anion gap: 10 (ref 5–15)
BUN: 10 mg/dL (ref 8–23)
CO2: 28 mmol/L (ref 22–32)
Calcium: 10.2 mg/dL (ref 8.9–10.3)
Chloride: 99 mmol/L (ref 98–111)
Creatinine, Ser: 0.87 mg/dL (ref 0.44–1.00)
GFR, Estimated: >60 mL/min (ref >60)
Glucose, Bld: 152 mg/dL — ABNORMAL HIGH (ref 70–99)
Potassium: 4.1 mmol/L (ref 3.5–5.1)
Sodium: 137 mmol/L (ref 135–145)

## 2020-12-27 LAB — TROPONIN I (HIGH SENSITIVITY)
Troponin I (High Sensitivity): 5 ng/L (ref <18)
Troponin I (High Sensitivity): 5 ng/L (ref <18)

## 2020-12-27 MED ORDER — OXYCODONE-ACETAMINOPHEN 5-325 MG PO TABS
1.0000 | ORAL_TABLET | Freq: Once | ORAL | Status: AC
  Administered 2020-12-27: 1 via ORAL
  Filled 2020-12-27: qty 1

## 2020-12-27 MED ORDER — OXYCODONE-ACETAMINOPHEN 5-325 MG PO TABS: 1.0000 | ORAL_TABLET | Freq: Four times a day (QID) | ORAL | 0 refills | Status: DC | PRN

## 2020-12-27 MED ORDER — LIDOCAINE 5 % EX PTCH
1.0000 | MEDICATED_PATCH | CUTANEOUS | Status: DC
  Administered 2020-12-27: 1 via TRANSDERMAL
  Filled 2020-12-27: qty 1

## 2020-12-27 MED ORDER — LIDOCAINE 5 % EX PTCH: 1.0000 | MEDICATED_PATCH | CUTANEOUS | 0 refills | Status: DC

## 2020-12-27 NOTE — ED Provider Notes (Signed)
Emergency Medicine Provider Triage Evaluation Note  Rebecca Graves , a 80 y.o. female  was evaluated in triage.  Pt complains of left chest pain with radiation into the left shoulder since Friday. Patient denies injury, reports the pain is constant, sharp, worse with movement and deep inspiration. No diaphoresis, N/V, tingling or numbness, lightheadedness or syncope. No hx of ACS, DM, tobacco use. Family hx of ACS. Patient does endorse some SOB.  Review of Systems  Positive: Chest pain, shoulder pain, SOB Negative: N/V, lightheadedness  Physical Exam  BP (!) 146/92 (BP Location: Right Arm)   Pulse 84   Temp 99.1 F (37.3 C) (Oral)   Resp 16   SpO2 90%  Gen:   Awake, no distress   Resp:  Normal effort  MSK:   Moves extremities without difficulty  Other:  No TTP of chest, no left CVA tenderness, no abdominal tenderness  Medical Decision Making  Medically screening exam initiated at 8:45 AM.  Appropriate orders placed.  Rebecca Graves was informed that the remainder of the evaluation will be completed by another provider, this initial triage assessment does not replace that evaluation, and the importance of remaining in the ED until their evaluation is complete.  Left chest / shoulder pain   Anselmo Pickler, PA-C 12/27/20 [id number]    Varney Biles, MD 12/29/20 [id number]

## 2020-12-27 NOTE — ED Triage Notes (Signed)
Pt reports "charlie horse pain" from L shoulder to L waist since Friday that is worse with movement and deep inspiration.  States pain radiates into chest. Denies nausea and vomiting.

## 2020-12-27 NOTE — ED Provider Notes (Signed)
[facility] EMERGENCY DEPARTMENT Provider Note   CSN: [id number] Arrival date & time: 12/27/20  0350     History Chief Complaint  Patient presents with   Chest Pain    Rebecca Graves is a 80 y.o. female.  Patient with history of chronic kidney disease, diabetes, GERD, hypertension --presents to the emergency department for evaluation of chest pain ongoing over the past 2 days.  Patient has pain in her left chest and left flank.  It is made worse with palpation and movement.  She also reports that is worse with deep breathing.  This makes her feel little short of breath.  No lightheadedness or syncope.  No vomiting or diaphoresis.  Symptoms have been waxing and waning over the past 2 days.  She has been taking Tylenol which helps relieve the pain somewhat, but pain returns when it worsens.  No skin rashes.  No cough.  She denies injuries.      Past Medical History:  Diagnosis Date   Anal fissure    Arthritis    Carotid stenosis, <50%, bilateral  12/20/2018   Colon polyps    hyperplastic   Diverticulosis    GERD (gastroesophageal reflux disease)    Glaucoma    Hemorrhoids    Hyperlipidemia    Hypertension    Hypothyroidism    Prediabetes     Patient Active Problem List   Diagnosis Date Noted   Diet-controlled diabetes mellitus ([place]) 06/19/2018   Insomnia 10/26/2017   Diabetic peripheral neuropathy ([place]) 01/18/2015   CKD stage 2 due to type 2 diabetes mellitus ([place]) 01/18/2015   Morbid obesity ([place]) BMI 35 associated with htn, hyperlipidemia, arthritis, T2DM 07/03/2014   Vitamin D deficiency 03/31/2013   Medication management 03/31/2013   DJD    Glaucoma    Hypothyroidism    Hypertension    Hyperlipidemia associated with type 2 diabetes mellitus ([place])    GERD (gastroesophageal reflux disease)    Tear of lateral meniscus of knee, current 03/27/2011    Past Surgical History:  Procedure Laterality Date   ABDOMINAL HYSTERECTOMY     APPENDECTOMY      BLADDER SUSPENSION     COLONOSCOPY     JOINT REPLACEMENT  2007   rt hip replacement   KNEE ARTHROSCOPY  03/28/2011   Procedure: ARTHROSCOPY KNEE;  Surgeon: Kerin Salen, MD;  Location: [place];  Service: Orthopedics;  Laterality: Right;  with partial lateral meniscectomy, Debridement of Chondromalacia     OB History   No obstetric history on file.     Family History  Problem Relation Age of Onset   Esophageal cancer Other        nefew- age 48   Colon polyps Sister    Diabetes Sister    Esophageal cancer Other    Colon cancer Neg Hx    Rectal cancer Neg Hx    Stomach cancer Neg Hx    Breast cancer Neg Hx     Social History   Tobacco Use   Smoking status: Former    Types: Cigarettes    Quit date: 03/22/1996    Years since quitting: 24.7   Smokeless tobacco: Never  Vaping Use   Vaping Use: Never used  Substance Use Topics   Alcohol use: Yes    Alcohol/week: 2.0 standard drinks    Types: 2 drink(s) per week    Comment: occ   Drug use: No    Home Medications Prior to Admission medications  Medication Sig Start Date End Date Taking? Authorizing Provider  Alcohol Swabs (B-D SINGLE USE SWABS REGULAR) PADS Use 1 swab prior to checking blood sugar daily-DX-E11.9 09/09/19   Unk Pinto, MD  ALPRAZolam Duanne Moron) 1 MG tablet Take 1/2-1 tablet 1 to 2 /day ONLY if needed for sleep or anxiety & limit to 5 days /week to avoid Addiction or Dementia 04/29/20   Garnet Sierras, NP  aspirin 81 MG tablet Take 81 mg by mouth daily. Takes two tablets daily    [provider]  bisoprolol-hydrochlorothiazide [facility]) 10-6.25 MG tablet TAKE 1 TABLET BY MOUTH  DAILY FOR BLOOD PRESSURE 10/12/20   Liane Comber, NP  Blood Glucose Calibration (TRUE METRIX LEVEL 1) Low SOLN Check blood sugar 1 time daily-DX-E11.9. 09/09/19   Unk Pinto, MD  Blood Glucose Monitoring Suppl (TRUE METRIX METER) w/Device KIT Check blood sugar 1 time daily-DX-E11.9 09/10/19   Unk Pinto, MD  Cholecalciferol (VITAMIN D-3) 5000 UNITS TABS Take 1 tablet by mouth daily.    [provider]  Cyanocobalamin (VITAMIN B 12 PO) Take 1 tablet by mouth daily.    [provider]  furosemide (LASIX) 40 MG tablet Take 1 tablet  Daily  for BP & Fluid Retention /Ankle Swelling 07/19/20   Unk Pinto, MD  gabapentin (NEURONTIN) 600 MG tablet TAKE 1/2 TO 1 TABLET BY  MOUTH 2 TO 3 TIMES DAILY AS NEEDED FOR PAIN 08/25/20   Liane Comber, NP  glucose blood test strip Check blood sugar 1 time a day-DX-E11.9. 09/10/19   Unk Pinto, MD  Lancets 28G MISC Check blood sugar 1 time a day-DX-E11.9 09/10/19   Unk Pinto, MD  latanoprost (XALATAN) 0.005 % ophthalmic solution Place 1 drop into both eyes daily.  05/12/17   [provider]  levothyroxine (SYNTHROID) 100 MCG tablet TAKE 1TAB DAILY ON AN EMPTY STOMACH WITH ONLY WATER FOR 30 MINS, &amp; NO ANTACID MEDS, CALCIUM OR MAGNESIUM FOR 4  HOURS, &amp; AVOID BIOTI 10/12/20   Liane Comber, NP  Magnesium 500 MG CAPS Take 2 capsules by mouth daily.     [provider]  Multiple Vitamins-Minerals (HAIR SKIN AND NAILS FORMULA PO) Take 1 tablet by mouth daily. Takes 1 gummy daily    [provider]  omeprazole (PRILOSEC) 20 MG capsule Take 1 Capsule  2 x/day (every 12  hours) to  Prevent  Heartburn  & Indigestion / Patient knows to take by mouth  ! 09/15/20   Unk Pinto, MD  phentermine (ADIPEX-P) 37.5 MG tablet Take 1/2 to 1 tablet every Morning for Dieting & Weight Loss 07/09/20   Unk Pinto, MD  rosuvastatin (CRESTOR) 5 MG tablet TAKE 1 TABLET BY MOUTH 3  TIMES PER WEEK ON MONDAY,  WEDNESDAY, AND FRIDAY FOR  CHOLESTEROL 08/25/20   Liane Comber, NP  timolol (TIMOPTIC) 0.5 % ophthalmic solution Place 1 drop into both eyes 2 (two) times daily.  02/26/16   [provider]  topiramate (TOPAMAX) 50 MG tablet Take 1/2 to 1 tablet 2 x /day at Suppertime & Bedtime for Dieting & Weight  Loss 07/09/20   Unk Pinto, MD    Allergies    Meloxicam  Review of Systems   Review of Systems  Constitutional:  Negative for diaphoresis and fever.  Eyes:  Negative for redness.  Respiratory:  Positive for shortness of breath. Negative for cough.   Cardiovascular:  Positive for chest pain. Negative for palpitations and leg swelling.  Gastrointestinal:  Negative for abdominal pain, nausea and  vomiting.  Genitourinary:  Positive for flank pain. Negative for dysuria.  Musculoskeletal:  Negative for back pain and neck pain.  Skin:  Negative for rash.  Neurological:  Negative for syncope and light-headedness.  Psychiatric/Behavioral:  The patient is not nervous/anxious.    Physical Exam Updated Vital Signs BP (!) 156/76   Pulse 70   Temp 99.1 F (37.3 C) (Oral)   Resp 17   SpO2 97%   Physical Exam Vitals and nursing note reviewed.  Constitutional:      Appearance: She is well-developed. She is not diaphoretic.  HENT:     Head: Normocephalic and atraumatic.     Mouth/Throat:     Mouth: Mucous membranes are not dry.  Eyes:     Conjunctiva/sclera: Conjunctivae normal.  Neck:     Vascular: Normal carotid pulses. No JVD.     Trachea: Trachea normal. No tracheal deviation.  Cardiovascular:     Rate and Rhythm: Normal rate and regular rhythm.     Pulses: No decreased pulses.          Radial pulses are 2+ on the right side and 2+ on the left side.     Heart sounds: Normal heart sounds, S1 normal and S2 normal. No murmur heard. Pulmonary:     Effort: Pulmonary effort is normal. No respiratory distress.     Breath sounds: No wheezing.  Chest:     Chest wall: Tenderness present.     Comments: Patient is holding her anterior and lateral chest wall just beside her left breast.  This is where the maximum pain is.  She winces when I palpate this area.  I do not see any skin rashes or signs of shingles. Abdominal:     General: Bowel sounds are normal.     Palpations: Abdomen  is soft.     Tenderness: There is no abdominal tenderness. There is no guarding or rebound.  Musculoskeletal:        General: Normal range of motion.     Cervical back: Normal range of motion and neck supple. No muscular tenderness.  Skin:    General: Skin is warm and dry.     Coloration: Skin is not pale.  Neurological:     Mental Status: She is alert.    ED Results / Procedures / Treatments   Labs (all labs ordered are listed, but only abnormal results are displayed) Labs Reviewed  BASIC METABOLIC PANEL - Abnormal; Notable for the following components:      Result Value   Glucose, Bld 152 (*)    All other components within normal limits  CBC  URINALYSIS, ROUTINE W REFLEX MICROSCOPIC  TROPONIN I (HIGH SENSITIVITY)  TROPONIN I (HIGH SENSITIVITY)    EKG EKG Interpretation  Date/Time:  Sunday December 27 2020 08:47:12 EDT Ventricular Rate:  72 PR Interval:  218 QRS Duration: 84 QT Interval:  382 QTC Calculation: 418 R Axis:   12 Text Interpretation: Sinus rhythm with 1st degree A-V block Minimal voltage criteria for LVH, may be normal variant ( R in aVL ) Borderline ECG No acute changes No old tracing to compare Confirmed by Varney Biles [id number]) on 12/27/2020 1:23:52 PM  Radiology DG Chest 2 View  Result Date: 12/27/2020 CLINICAL DATA:  Left chest pain EXAM: CHEST - 2 VIEW COMPARISON:  04/19/2006 FINDINGS: Blunting of the lateral left costophrenic sulcus. Mild interstitial opacity at the bases. No pneumothorax. Normal heart size and mediastinal contours. IMPRESSION: Mild atelectasis or infiltration at the bases  with trace left pleural effusion. Electronically Signed   By: Jorje Guild M.D.   On: 12/27/2020 09:46    Procedures Procedures   Medications Ordered in ED Medications  lidocaine (LIDODERM) 5 % 1 patch (1 patch Transdermal Patch Applied 12/27/20 1343)  oxyCODONE-acetaminophen (PERCOCET/ROXICET) 5-325 MG per tablet 1 tablet (1 tablet Oral Given 12/27/20  1341)    ED Course  I have reviewed the triage vital signs and the nursing notes.  Pertinent labs & imaging results that were available during my care of the patient were reviewed by me and considered in my medical decision making (see chart for details).  Patient seen and examined. Work-up initiated. Medications ordered.   Symptoms seem to be very reproducible.  Suspect chest wall pain.  Will check second troponin due to risk factors and age.  Patient discussed with Dr. Kathrynn Humble who will see patient.  Prescribed oral Percocet and Lidoderm patch.  Will reassess.  EKG reviewed and is nonischemic and unchanged from previous.  Vital signs reviewed and are as follows: BP (!) 132/53   Pulse 64   Temp 99.1 F (37.3 C) (Oral)   Resp 18   SpO2 96%   2:27 PM Troponin 5 >> 5.   Pain is improved.  Will obtain imaging to evaluate pleural effusion on left side of chest where the patient's pain is.  She looks much more comfortable now.  3:59 PM CT reviewed.  No definite pneumonia.  Small pleural effusion noted.  Patient updated on results.  Encourage PCP follow-up.  At this point, will discharged home with pain control.  Rx: Percocet, Lidoderm patch.  Patient counseled on use of narcotic pain medications. Counseled not to combine these medications with others containing tylenol. Use pain medication only under direct supervision at the lowest possible dose needed to control your pain. Urged not to drink alcohol, drive, or perform any other activities that requires focus while taking these medications. The patient verbalizes understanding and agrees with the plan.     MDM Rules/Calculators/A&P                           Patient with left-sided chest and flank pain.  Is reproducible.  She does have left pleural effusion, very small, on that side.  No pneumonias on CT.  Lab work-up is reassuring.  Troponin negative x2.  No concern for ACS, PE.  Normal vitals without hypoxia or tachycardia.  No  fevers.  Pain is adequately controlled and patient will be discharged home with close return instructions and follow-up with PCP.   Final Clinical Impression(s) / ED Diagnoses Final diagnoses:  Pleural effusion  Chest wall pain    Rx / DC Orders ED Discharge Orders          Ordered    oxyCODONE-acetaminophen (PERCOCET/ROXICET) 5-325 MG tablet  Every 6 hours PRN,   Status:  Discontinued        12/27/20 1534    lidocaine (LIDODERM) 5 %  Every 24 hours        12/27/20 1534    oxyCODONE-acetaminophen (PERCOCET/ROXICET) 5-325 MG tablet  Every 6 hours PRN        12/27/20 1536             Carlisle Cater, PA-C 12/27/20 1600    Varney Biles, MD 12/29/20 1615

## 2020-12-27 NOTE — Discharge Instructions (Addendum)
Please read and follow all provided instructions.  Your diagnoses today include:  1. Pleural effusion   2. Chest wall pain     Tests performed today include: Complete blood cell count: Were normal Basic metabolic panel: Normal Cardiac enzymes (blood test looking for stress on the heart): No signs of heart attack EKG: No signs of heart Chest x-ray: She has a small amount of fluid in the lower left lung CT scan: Also shows a small amount of fluid in the lung without pneumonia or other serious problems  Medications prescribed:  Percocet (oxycodone/acetaminophen) - narcotic pain medication  DO NOT drive or perform any activities that require you to be awake and alert because this medicine can make you drowsy. BE VERY CAREFUL not to take multiple medicines containing Tylenol (also called acetaminophen). Doing so can lead to an overdose which can damage your liver and cause liver failure and possibly death.  Lidoderm patch  Take any prescribed medications only as directed.  Home care instructions:  Follow any educational materials contained in this packet.  BE VERY CAREFUL not to take multiple medicines containing Tylenol (also called acetaminophen). Doing so can lead to an overdose which can damage your liver and cause liver failure and possibly death.   Follow-up instructions: Please follow-up with your primary care provider in the next 7 days for further evaluation of your symptoms.   Return instructions:  Please return to the Emergency Department if you experience worsening symptoms.  Please return if you have any other emergent concerns.  Additional Information:  Your vital signs today were: BP (!) 161/74   Pulse (!) 59   Temp 99 F (37.2 C)   Resp 20   SpO2 95%  If your blood pressure (BP) was elevated above 135/85 this visit, please have this repeated by your doctor within one month. --------------

## 2021-01-04 NOTE — Progress Notes (Signed)
Future Appointments  Date Time Provider [place]  01/05/2021 10:30 AM Unk Pinto, MD [facility] None  04/08/2021 11:00 AM Liane Comber, NP [facility] None  07/12/2021 11:00 AM Unk Pinto, MD [facility] None    History of Present Illness:     Patient is a very nice 80 yo DBF  w/HTN, HLD, T2_NIDDM who was evaluated in the ER 9 days ago for Left chest wall pain. Xrays , troponins were negatuive. Patient was discharged with # 6 Oxycodone & Lidoderm patches.     Medications    levothyroxine (SYNTHROID) 100 MCG tablet, TAKE  1 TAB DAILY    bisoprolol-hydrochlorothiazide (ZIAC) 10-6.25 MG tablet, TAKE 1 TABLET DAILY    furosemide (LASIX) 40 MG tablet, Take 1 tablet  Daily     rosuvastatin (CRESTOR) 5 MG tablet, TAKE 1 TABLET 3  TIMES PER WEEK ON MONDAY,  WEDNESDAY, AND FRIDAY FOR  CHOLESTEROL   aspirin 81 MG tablet, Take 81 mg by mouth daily. Takes two tablets daily    oxyCODONE-acetaminophen (PERCOCET/ROXICET) 5-325 MG tablet, Take 1 tablet by mouth every 6 (six) hours as needed for severe pain.    Cyanocobalamin (VITAMIN B 12 PO), Take 1 tablet by mouth daily.   ALPRAZolam (XANAX) 1 MG tablet, Take 1/2-1 tablet 1 to 2 /day ONLY if needed for sleep or anxiety & limit to 5 days /week to avoid Addiction or Dementia   Cholecalciferol (VITAMIN D-3) 5000 UNITS TABS, Take 1 tablet by mouth daily.   gabapentin (NEURONTIN) 600 MG tablet, TAKE 1/2 TO 1 TABLET BY  MOUTH 2 TO 3 TIMES DAILY AS NEEDED FOR PAIN   latanoprost (XALATAN) 0.005 % ophthalmic solution, Place 1 drop into both eyes daily.    lidocaine (LIDODERM) 5 %, Place 1 patch onto the skin daily. Remove & Discard patch within 12 hours or as directed by MD   Magnesium 500 MG CAPS, Take 2 capsules by mouth daily.    Multiple Vitamins-Minerals (HAIR SKIN AND NAILS FORMULA PO), Take 1 tablet by mouth daily. Takes 1 gummy daily   omeprazole (PRILOSEC) 20 MG capsule, Take 1 Capsule  2 x/day (every 12  hours) to  Prevent   Heartburn  & Indigestion / Patient knows to take by mouth  !   phentermine (ADIPEX-P) 37.5 MG tablet, Take 1/2 to 1 tablet every Morning for Dieting & Weight Loss   timolol (TIMOPTIC) 0.5 % ophthalmic solution, Place 1 drop into both eyes 2 (two) times daily.    topiramate (TOPAMAX) 50 MG tablet, Take 1/2 to 1 tablet 2 x /day at Suppertime & Bedtime for Dieting & Weight Loss  Problem list She has Tear of lateral meniscus of knee, current; DJD; Glaucoma; Hypothyroidism; Hypertension; Hyperlipidemia associated with type 2 diabetes mellitus ([place]); GERD (gastroesophageal reflux disease); Vitamin D deficiency; Medication management; Morbid obesity ([facility]) BMI 35 associated with htn, hyperlipidemia, arthritis, T2DM; Diabetic peripheral neuropathy ([place]); CKD stage 2 due to type 2 diabetes mellitus ([place]); Insomnia; and Diet-controlled diabetes mellitus ([place]) on their problem list.   Observations/Objective:  BP 134/72   Pulse (!) 57   Temp 97.9 F (36.6 C)   Resp 16   Ht 5' 7.5" (1.715 m)   Wt 222 lb 12.8 oz (101.1 kg)   SpO2 98%   BMI 34.38 kg/m   HEENT - WNL. Neck - supple.  Chest - Clear equal BS. Tender left lateral chest wall  Cor - Nl HS. RRR w/o sig MGR. PP 1(+). No edema. MS- FROM  w/o deformities.  Gait Nl. Neuro -  Nl w/o focal abnormalities.   Assessment and Plan:  1. Left-sided chest wall pain  - dexamethasone (DECADRON) 4 MG tablet;  Take 1 tab 3 x day - 3 days, then 2 x day - 3 days, then 1 tab daily   Dispense: 20 tablet  Follow Up Instructions:        I discussed the assessment and treatment plan with the patient. The patient was provided an opportunity to ask questions and all were answered. The patient agreed with the plan and demonstrated an understanding of the instructions.       The patient was advised to call back or seek an in-person evaluation if the symptoms worsen or if the condition fails to improve as anticipated.    Kirtland Bouchard, MD

## 2021-01-05 ENCOUNTER — Encounter: Payer: Self-pay | Admitting: Internal Medicine

## 2021-01-05 ENCOUNTER — Ambulatory Visit (INDEPENDENT_AMBULATORY_CARE_PROVIDER_SITE_OTHER): Payer: Medicare Other | Admitting: Internal Medicine

## 2021-01-05 ENCOUNTER — Other Ambulatory Visit: Payer: Self-pay

## 2021-01-05 VITALS — BP 134/72 | HR 57 | Temp 97.9°F | Resp 16 | Ht 67.5 in | Wt 222.8 lb

## 2021-01-05 DIAGNOSIS — R0789 Other chest pain: Secondary | ICD-10-CM | POA: Diagnosis not present

## 2021-01-05 MED ORDER — DEXAMETHASONE 4 MG PO TABS
ORAL_TABLET | ORAL | 0 refills | Status: DC
Start: 2021-01-05 — End: 2021-04-08

## 2021-01-12 DIAGNOSIS — H16143 Punctate keratitis, bilateral: Secondary | ICD-10-CM | POA: Diagnosis not present

## 2021-01-12 DIAGNOSIS — H401131 Primary open-angle glaucoma, bilateral, mild stage: Secondary | ICD-10-CM | POA: Diagnosis not present

## 2021-03-11 ENCOUNTER — Other Ambulatory Visit: Payer: Self-pay | Admitting: Orthopedic Surgery

## 2021-03-11 DIAGNOSIS — M25551 Pain in right hip: Secondary | ICD-10-CM

## 2021-03-11 DIAGNOSIS — M67911 Unspecified disorder of synovium and tendon, right shoulder: Secondary | ICD-10-CM | POA: Diagnosis not present

## 2021-03-16 DIAGNOSIS — H401131 Primary open-angle glaucoma, bilateral, mild stage: Secondary | ICD-10-CM | POA: Diagnosis not present

## 2021-03-25 ENCOUNTER — Other Ambulatory Visit: Payer: Self-pay | Admitting: Orthopedic Surgery

## 2021-03-31 ENCOUNTER — Other Ambulatory Visit: Payer: Self-pay

## 2021-03-31 ENCOUNTER — Ambulatory Visit
Admission: RE | Admit: 2021-03-31 | Discharge: 2021-03-31 | Disposition: A | Discharge status: Home / Self Care | Payer: Medicare Other | Source: Ambulatory Visit | Attending: Orthopedic Surgery | Admitting: Orthopedic Surgery

## 2021-03-31 DIAGNOSIS — M25551 Pain in right hip: Secondary | ICD-10-CM

## 2021-03-31 DIAGNOSIS — M533 Sacrococcygeal disorders, not elsewhere classified: Secondary | ICD-10-CM | POA: Diagnosis not present

## 2021-03-31 DIAGNOSIS — D179 Benign lipomatous neoplasm, unspecified: Secondary | ICD-10-CM | POA: Diagnosis not present

## 2021-03-31 DIAGNOSIS — M1611 Unilateral primary osteoarthritis, right hip: Secondary | ICD-10-CM | POA: Diagnosis not present

## 2021-03-31 DIAGNOSIS — Z471 Aftercare following joint replacement surgery: Secondary | ICD-10-CM | POA: Diagnosis not present

## 2021-04-02 ENCOUNTER — Other Ambulatory Visit: Payer: Self-pay | Admitting: Internal Medicine

## 2021-04-07 ENCOUNTER — Encounter: Payer: Self-pay | Admitting: Adult Health

## 2021-04-07 NOTE — Progress Notes (Signed)
MEDICARE ANNUAL WELLNESS VISIT AND FOLLOW UP  Assessment:   Annual Medicare Wellness Visit Due annually  Health maintenance reviewed - schedule bone density, requested diabetes eye exam report  Essential hypertension - continue medications, DASH diet, exercise and monitor at home. Call if greater than 130/80. Continue taking Bisoprolol-HCTZ 10-6.25 half tablet - declines switch to ACE/ARB at this time  -     CBC with Differential/Platelet -     CMP/GFR -     TSH  Hyperlipidemia associated with T2DM (HCC) -continue rosuvastatin 91m, taking 3days a week,  for LDL goal <70 check lipids, decrease fatty foods, increase activity.   Gastroesophageal reflux disease, esophagitis presence not specified Currently on PPI with well controlled sx; plan taper after surgery with H2i Discussed diet, avoiding triggers and other lifestyle changes  Diet controlled T2DM ([facility]  Education: Reviewed ABCs of diabetes management (respective goals in parentheses):  A1C (<7), blood pressure (<130/80), and cholesterol (LDL <70) Eye Exam yearly and Dental Exam every 6 months.  Dietary recommendations Physical Activity recommendations -     Hemoglobin A1c  CKD II associated with T2DM (HCC) Increase fluids, avoid NSAIDS, monitor sugars, will monitor  Hypothyroidism, unspecified type Taking Levothyroxine 1025mwhole tablet daily  check TSH level, continue medications the same, reminded to take on an empty stomach 30-60105m before food.  -     TSH  Peripheral sensory neuropathy, diabetic (HCC) Control glucose, do daily food checks Gabapentin PRN is working well   Osteoarthritis, unspecified osteoarthritis type, unspecified site S/P TKR Right. Followed by Dr. RowMayer Camelorbid obesity - BMI 35 with diabetes, htn, hyperlipidemia -Discussed dietary and exercise modifications -recommended diet heavy in fruits and veggies and low in animal meats, cheeses, and dairy products - restart swimming regularly  after surgery - try eating dinner earlier, eat off of salad plate, wait 10-15 min prior to getting seconds - phentermine/topamax helpful per patient, restart after surgery   Vitamin D deficiency Continue supplement  Medication management -     Magnesium  Glaucoma, unspecified glaucoma type, unspecified laterality Continue eye doctor follow up  Insomnia  - good sleep hygiene discussed, increase day time activity, doing well with gabapentin   R hip arthritis Upcoming replacement planned by Dr. RowMayer Camelnding A1C results she is cleared for surgery and will fax this tomorrow Routine period precautions, hold ASA 7 days prior to surgery  Had recent EKG by Dr. RowMayer Camelr patient   Urinary frequency/ OAB Likely some degree of pelvic relaxation, possible prolapse, needs pelvic, insufficient time today Plan with DanHinton Dyerth next routine follow up, then referral for pelvic floor PT was discussed and interested Discussed meds but will hold off for now per patient preference    Future Appointments  Date Time Provider DepT[place]ul[place]/13/2023  2:00 PM [facility] PAT 1 [facility] None  07/12/2021 11:00 AM McKUnk PintoD [facility] None  04/08/2022 11:00 AM CorLiane ComberP [facility] None     Plan:   During the course of the visit the patient was educated and counseled about appropriate screening and preventive services including:   Pneumococcal vaccine  Influenza vaccine Td vaccine Screening electrocardiogram Screening mammography Bone densitometry screening Colorectal cancer screening Diabetes screening Glaucoma screening Nutrition counseling  Advanced directives: given info/requested   Subjective:   Rebecca Graves a [age]o. female who presents for Medicare Annual Wellness Visit and 3 month follow up on HTN, T2 diabetes with CKD and neuropathy, hyperlipidemia, vitamin D def. She has DJD;  Glaucoma; Hypothyroidism; Hypertension; Hyperlipidemia associated with type 2  diabetes mellitus ([place]); GERD (gastroesophageal reflux disease); Vitamin D deficiency; Medication management; Morbid obesity (HCC) BMI 35 associated with htn, hyperlipidemia, arthritis, T2DM; Diabetic peripheral neuropathy ([place]); CKD stage 2 due to type 2 diabetes mellitus ([place]); Insomnia; Diet-controlled diabetes mellitus ([place]); and OAB (overactive bladder) on their problem list.  She is established with Dr. Mayer Camel, s/p R TKA. She has upcoming R hip replacement planned 04/19/2021.   GERD- managing at this time, taking omeprazole daily.  Hx hysterectomy and bladder suspension remotely, no recent pelvic, she notes in the last 2-3 years has noted increased urinary frequency, some urgency with leaking bladder, urinates every hour or so, gets up 3-4 times a night.   BMI is Body mass index is 35.49 kg/m., she has not been working on diet, stopped swimming after her niece passed away who she was swimming with, plans to restart gym once recovered from surgery. She admits she eats dinner too late, too much. She is on phentermine/topamax but currently taking a break. Would like to get 180 lb.  Wt Readings from Last 3 Encounters:  04/08/21 230 lb (104.3 kg)  01/05/21 222 lb 12.8 oz (101.1 kg)  07/09/20 227 lb 3.2 oz (103.1 kg)   Her blood pressure has been controlled at home, today their BP is BP: 128/66 She does workout. She denies chest pain, shortness of breath, dizziness.   She is on cholesterol medication (on rosuvastatin 5 mg three days a week) and denies myalgias. Her cholesterol is at goal. The cholesterol last visit was:   Lab Results  Component Value Date   CHOL 154 07/09/2020   HDL 73 07/09/2020   LDLCALC 65 07/09/2020   TRIG 78 07/09/2020   CHOLHDL 2.1 07/09/2020   She has been working on diet and exercise for Diet controlled T2DM with CKD 2 and neuropathy.  She has cut out soda, switched to diet tea, she is on ASA and she is not on ACE - declines at this time  Denies increased appetite,  nausea, polydipsia, polyuria and vomiting.  She does have neuropathy, does well during the day, using gabapentin at night.  She checks occasional fasting sugars, typically <120, rare up to 130.  Last A1C in the office was:  Lab Results  Component Value Date   HGBA1C 6.7 (H) 07/09/2020   She has been pushing fluid intake:   Lab Results  Component Value Date   GFRAA 80 07/09/2020   Patient is on Vitamin D supplement and at goal at recent check:  Lab Results  Component Value Date   VD25OH 26 07/09/2020     She is on thyroid medication. Her medication was not changed last visit, taking 100 mcg tab, 1 tab daily on empty stomach. Patient denies nervousness, palpitations and weight changes.  Lab Results  Component Value Date   TSH 3.32 07/09/2020     Medication Review Current Outpatient Medications on File Prior to Visit  Medication Sig Dispense Refill   acetaminophen (TYLENOL) 500 MG tablet Take 1,000 mg by mouth every 6 (six) hours as needed for moderate pain.     Alcohol Swabs (B-D SINGLE USE SWABS REGULAR) PADS Use 1 swab prior to checking blood sugar daily-DX-E11.9 100 each 3   aspirin 81 MG tablet Take 81 mg by mouth daily.     Biotin w/ Vitamins C & E (HAIR SKIN & NAILS GUMMIES PO) Take 1 tablet by mouth in the morning and at bedtime.  bisoprolol-hydrochlorothiazide (ZIAC) 10-6.25 MG tablet TAKE 1 TABLET BY MOUTH  DAILY FOR BLOOD PRESSURE 90 tablet 3   Blood Glucose Calibration (TRUE METRIX LEVEL 1) Low SOLN Check blood sugar 1 time daily-DX-E11.9. 3 each 3   Blood Glucose Monitoring Suppl (TRUE METRIX METER) w/Device KIT Check blood sugar 1 time daily-DX-E11.9 1 kit 0   Cholecalciferol (VITAMIN D) 50 MCG (2000 UT) tablet Take 4,000 Units by mouth daily.     docusate sodium (COLACE) 100 MG capsule Take 100 mg by mouth 2 (two) times daily.     furosemide (LASIX) 40 MG tablet Take 1 tablet  Daily  for BP & Fluid Retention /Ankle Swelling 90 tablet 3   gabapentin (NEURONTIN) 600  MG tablet TAKE 1/2 TO 1 TABLET BY  MOUTH 2 TO 3 TIMES DAILY AS NEEDED FOR PAIN 270 tablet 3   levothyroxine (SYNTHROID) 100 MCG tablet TAKE 1TAB DAILY ON AN EMPTY STOMACH WITH ONLY WATER FOR 30 MINS, &amp; NO ANTACID MEDS, CALCIUM OR MAGNESIUM FOR 4  HOURS, &amp; AVOID BIOTI 90 tablet 3   magnesium oxide (MAG-OX) 400 MG tablet Take 400 mg by mouth 2 (two) times daily.     omeprazole (PRILOSEC) 20 MG capsule Take 1 Capsule  2 x/day (every 12  hours) to  Prevent  Heartburn  & Indigestion / Patient knows to take by mouth  ! (Patient taking differently: Take 20 mg by mouth daily.) 180 capsule 3   rosuvastatin (CRESTOR) 5 MG tablet TAKE 1 TABLET BY MOUTH 3  TIMES PER WEEK ON MONDAY,  WEDNESDAY, AND FRIDAY FOR  CHOLESTEROL 39 tablet 3   Trypsin-Castor Oil-[place] Balsam (OPTASE EX) Place 1 application into both eyes daily.     vitamin B-12 (CYANOCOBALAMIN) 1000 MCG tablet Take 1,000 mcg by mouth daily.     glucose blood test strip Check blood sugar 1 time a day-DX-E11.9. 100 each 3   Lancets 28G MISC Check blood sugar 1 time a day-DX-E11.9 100 each 3   latanoprost (XALATAN) 0.005 % ophthalmic solution Place 1 drop into both eyes at bedtime.     phentermine (ADIPEX-P) 37.5 MG tablet Take 1/2 to 1 tablet every Morning for Dieting & Weight Loss (Patient not taking: Reported on 04/06/2021) 90 tablet 1   topiramate (TOPAMAX) 50 MG tablet Take 1/2 to 1 tablet 2 x /day at Suppertime & Bedtime for Dieting & Weight Loss (Patient not taking: Reported on 01/05/2021) 180 tablet 1   No current facility-administered medications on file prior to visit.    Current Problems (verified) Patient Active Problem List   Diagnosis Date Noted   OAB (overactive bladder) 04/08/2021   Diet-controlled diabetes mellitus ([place]) 06/19/2018   Insomnia 10/26/2017   Diabetic peripheral neuropathy ([place]) 01/18/2015   CKD stage 2 due to type 2 diabetes mellitus ([place]) 01/18/2015   Morbid obesity ([place]) BMI 35 associated with htn,  hyperlipidemia, arthritis, T2DM 07/03/2014   Vitamin D deficiency 03/31/2013   Medication management 03/31/2013   DJD    Glaucoma    Hypothyroidism    Hypertension    Hyperlipidemia associated with type 2 diabetes mellitus ([place])    GERD (gastroesophageal reflux disease)     Screening Tests Immunization History  Administered Date(s) Administered   Influenza, High Dose Seasonal PF 11/25/2016, 11/24/2017, 10/17/2018, 11/14/2018, 11/28/2020   Influenza-Unspecified 12/09/2013, 11/14/2015, 11/23/2016   PFIZER Comirnaty(Gray Top)Covid-19 Tri-Sucrose Vaccine 01/27/2021   PFIZER(Purple Top)SARS-COV-2 Vaccination 05/12/2019, 06/01/2019, 12/16/2019, 06/08/2020   Pneumococcal Conjugate-13 04/21/2015   Pneumococcal Polysaccharide-23 05/29/2008  Td 04/26/2007   Health Maintenance  Topic Date Due   Zoster Vaccines- Shingrix (1 of 2) Never done   OPHTHALMOLOGY EXAM  04/13/2018   HEMOGLOBIN A1C  01/09/2021   COVID-19 Vaccine (6 - Booster for Pfizer series) 03/24/2021   TETANUS/TDAP  04/08/2022 (Originally 04/25/2017)   FOOT EXAM  07/08/2021   MAMMOGRAM  09/15/2021   Pneumonia Vaccine 66+ Years old  Completed   INFLUENZA VACCINE  Completed   DEXA SCAN  Completed   HPV VACCINES  Aged Out    Last colonoscopy: Dr. Fuller Plan on 11/2017, polyp, no recall due to age  Last pap smear/pelvic exam: remote, declines further Last mammogram: gets annually at breast center DEXA: 2017, normal, discussed follow up, ordered to schedule, phone number given   TD or Tdap: 2009, declines due to cost, boost PRN Shingles/Zostavax: declines due to price  Names of Other Physician/Practitioners you currently use: 1. [facility]- here for primary care 2. Dr. Peter Garter, eye doctor,has appointment coming up, visits every 3-4 months for [place], last 2023 report requested 3. Can't remember name, dentist, last visit 2017, has partials, encouraged to schedule follow up  Patient Care  Team: Unk Pinto, MD as PCP - General (Internal Medicine) Camillo Flaming, [place] as Referring Physician (Optometry) Suella Broad, MD as Consulting Physician (Physical Medicine and Rehabilitation) Ladene Artist, MD as Consulting Physician (Gastroenterology) Myrlene Broker, MD as Attending Physician (Urology)   Allergies Allergies  Allergen Reactions   Meloxicam Swelling    Weight gain    SURGICAL HISTORY She  has a past surgical history that includes Abdominal hysterectomy (1988); Appendectomy; Joint replacement (2007); Bladder suspension; Knee arthroscopy (03/28/2011); and Colonoscopy. FAMILY HISTORY Her family history includes Colon polyps in her sister; Diabetes in her sister; Esophageal cancer in some other family members. SOCIAL HISTORY She  reports that she quit smoking about 25 years ago. Her smoking use included cigarettes. She has never used smokeless tobacco. She reports current alcohol use of about 2.0 standard drinks per week. She reports that she does not use drugs.  MEDICARE WELLNESS OBJECTIVES: Physical activity: Current Exercise Habits: The patient does not participate in regular exercise at present, Exercise limited by: orthopedic condition(s) Cardiac risk factors: Cardiac Risk Factors include: advanced age (>16mn, >>85women);dyslipidemia;hypertension;obesity (BMI >30kg/m2);sedentary lifestyle;diabetes mellitus Depression/mood screen:   [facility] 04/08/2021  Decreased Interest 0  Down, Depressed, Hopeless 0  PHQ - 2 Score 0    ADLs:  In your present state of health, do you have any difficulty performing the following activities: 04/08/2021  Hearing? N  Vision? N  Difficulty concentrating or making decisions? N  Walking or climbing stairs? Y  Comment having hip replaced  Dressing or bathing? N  Doing errands, shopping? N  Some recent data might be hidden      Cognitive Testing  Alert? Yes  Normal Appearance?Yes  Oriented to person?  Yes  Place? Yes   Time? Yes  Recall of three objects?  Yes  Can perform simple calculations? Yes  Displays appropriate judgment?Yes  Can read the correct time from a watch face?Yes  EOL planning: Does Patient Have a Medical Advance Directive?: No Would patient like information on creating a medical advance directive?: Yes (MAU/Ambulatory/Procedural Areas - Information given)   Objective:   Blood pressure 128/66, pulse 69, temperature 97.9 F (36.6 C), height 5' 7.5" (1.715 m), weight 230 lb (104.3 kg), SpO2 97 %. Body mass index is 35.49 kg/m.  General  appearance: alert, no distress, WD/WN,  female HEENT: normocephalic, sclerae anicteric, TMs pearly, nares patent, no discharge or erythema, pharynx normal Oral cavity: MMM, no lesions Neck: supple, no lymphadenopathy, no thyromegaly, no masses Heart: RRR, normal S1, S2, no murmurs Lungs: CTA bilaterally, no wheezes, rhonchi, or rales Abdomen: +bs, soft, non tender, non distended, no masses, no hepatomegaly, no splenomegaly Musculoskeletal: nontender, antalgic gait with reduced R hip ROM Extremities: no edema, no cyanosis, no clubbing Pulses: 2+ symmetric, upper and lower extremities, normal cap refill Neurological: alert, oriented x 3, CN2-12 intact, strength normal upper extremities and lower extremities, sensation diminished bil feet to monofilament, DTRs 2+ throughout, no cerebellar signs, gait antalgic but steady  Psychiatric: normal affect, behavior normal, pleasant  Breast: defer Gyn: defer Rectal: defer  Medicare Attestation I have personally reviewed: The patient's medical and social history Their use of alcohol, tobacco or illicit drugs Their current medications and supplements The patient's functional ability including ADLs,fall risks, home safety risks, cognitive, and hearing and visual impairment Diet and physical activities Evidence for depression or mood disorders  The patient's weight, height, BMI, and visual  acuity have been recorded in the chart.  I have made referrals, counseling, and provided education to the patient based on review of the above and I have provided the patient with a written personalized care plan for preventive services.     Izora Ribas, NP   04/06/2020

## 2021-04-08 ENCOUNTER — Other Ambulatory Visit: Payer: Self-pay | Admitting: Orthopedic Surgery

## 2021-04-08 ENCOUNTER — Other Ambulatory Visit: Payer: Self-pay

## 2021-04-08 ENCOUNTER — Ambulatory Visit (INDEPENDENT_AMBULATORY_CARE_PROVIDER_SITE_OTHER): Payer: Medicare Other | Admitting: Adult Health

## 2021-04-08 ENCOUNTER — Encounter: Payer: Self-pay | Admitting: Adult Health

## 2021-04-08 ENCOUNTER — Ambulatory Visit: Payer: Medicare Other | Admitting: Adult Health Nurse Practitioner

## 2021-04-08 VITALS — BP 128/66 | HR 69 | Temp 97.9°F | Ht 67.5 in | Wt 230.0 lb

## 2021-04-08 DIAGNOSIS — E1122 Type 2 diabetes mellitus with diabetic chronic kidney disease: Secondary | ICD-10-CM | POA: Diagnosis not present

## 2021-04-08 DIAGNOSIS — E2839 Other primary ovarian failure: Secondary | ICD-10-CM

## 2021-04-08 DIAGNOSIS — E559 Vitamin D deficiency, unspecified: Secondary | ICD-10-CM | POA: Diagnosis not present

## 2021-04-08 DIAGNOSIS — Z0001 Encounter for general adult medical examination with abnormal findings: Secondary | ICD-10-CM | POA: Diagnosis not present

## 2021-04-08 DIAGNOSIS — N182 Chronic kidney disease, stage 2 (mild): Secondary | ICD-10-CM

## 2021-04-08 DIAGNOSIS — I10 Essential (primary) hypertension: Secondary | ICD-10-CM | POA: Diagnosis not present

## 2021-04-08 DIAGNOSIS — E119 Type 2 diabetes mellitus without complications: Secondary | ICD-10-CM

## 2021-04-08 DIAGNOSIS — K219 Gastro-esophageal reflux disease without esophagitis: Secondary | ICD-10-CM | POA: Diagnosis not present

## 2021-04-08 DIAGNOSIS — Z79899 Other long term (current) drug therapy: Secondary | ICD-10-CM | POA: Diagnosis not present

## 2021-04-08 DIAGNOSIS — E039 Hypothyroidism, unspecified: Secondary | ICD-10-CM | POA: Diagnosis not present

## 2021-04-08 DIAGNOSIS — R6889 Other general symptoms and signs: Secondary | ICD-10-CM

## 2021-04-08 DIAGNOSIS — Z0000 Encounter for general adult medical examination without abnormal findings: Secondary | ICD-10-CM

## 2021-04-08 DIAGNOSIS — G4700 Insomnia, unspecified: Secondary | ICD-10-CM | POA: Diagnosis not present

## 2021-04-08 DIAGNOSIS — E1169 Type 2 diabetes mellitus with other specified complication: Secondary | ICD-10-CM | POA: Diagnosis not present

## 2021-04-08 DIAGNOSIS — E1142 Type 2 diabetes mellitus with diabetic polyneuropathy: Secondary | ICD-10-CM | POA: Diagnosis not present

## 2021-04-08 DIAGNOSIS — E785 Hyperlipidemia, unspecified: Secondary | ICD-10-CM

## 2021-04-08 DIAGNOSIS — N3281 Overactive bladder: Secondary | ICD-10-CM | POA: Insufficient documentation

## 2021-04-08 DIAGNOSIS — H409 Unspecified glaucoma: Secondary | ICD-10-CM

## 2021-04-08 NOTE — Patient Instructions (Addendum)
° °  Ms. Rebecca Graves , Thank you for taking time to come for your Medicare Wellness Visit. I appreciate your ongoing commitment to your health goals. Please review the following plan we discussed and let me know if I can assist you in the future.   These are the goals we discussed:  Goals      Exercise 3x per week      HEMOGLOBIN A1C < 5.7     LDL CALC < 70     Weight (lb) < 210 lb (95.3 kg)        This is a list of the screening recommended for you and due dates:  Health Maintenance  Topic Date Due   Zoster (Shingles) Vaccine (1 of 2) Never done   Eye exam for diabetics  04/13/2018   Hemoglobin A1C  01/09/2021   COVID-19 Vaccine (6 - Booster for Pfizer series) 03/24/2021   Tetanus Vaccine  04/08/2022*   Complete foot exam   07/08/2021   Mammogram  09/15/2021   Pneumonia Vaccine  Completed   Flu Shot  Completed   DEXA scan (bone density measurement)  Completed   HPV Vaccine  Aged Out  *Topic was postponed. The date shown is not the original due date.     Please call to schedule a bone density exam   [facility]  7 a.m.-6:30 p.m., Monday 7 a.m.-5 p.m., Tuesday-Friday Schedule an appointment by calling [phone].

## 2021-04-09 ENCOUNTER — Other Ambulatory Visit (HOSPITAL_COMMUNITY): Payer: Self-pay

## 2021-04-09 LAB — CBC WITH DIFFERENTIAL/PLATELET
Absolute Monocytes: 368 cells/uL (ref 200–950)
Basophils Absolute: 48 cells/uL (ref 0–200)
Basophils Relative: 1.2 %
Eosinophils Absolute: 128 cells/uL (ref 15–500)
Eosinophils Relative: 3.2 %
HCT: 42.4 % (ref 35.0–45.0)
Hemoglobin: 13.9 g/dL (ref 11.7–15.5)
Lymphs Abs: 1684 cells/uL (ref 850–3900)
MCH: 26.6 pg — ABNORMAL LOW (ref 27.0–33.0)
MCHC: 32.8 g/dL (ref 32.0–36.0)
MCV: 81.1 fL (ref 80.0–100.0)
MPV: 10.3 fL (ref 7.5–12.5)
Monocytes Relative: 9.2 %
Neutro Abs: 1772 cells/uL (ref 1500–7800)
Neutrophils Relative %: 44.3 %
Platelets: 255 10*3/uL (ref 140–400)
RBC: 5.23 10*6/uL — ABNORMAL HIGH (ref 3.80–5.10)
RDW: 13.9 % (ref 11.0–15.0)
Total Lymphocyte: 42.1 %
WBC: 4 10*3/uL (ref 3.8–10.8)

## 2021-04-09 LAB — COMPLETE METABOLIC PANEL WITH GFR
AG Ratio: 1.7 (calc) (ref 1.0–2.5)
ALT: 9 U/L (ref 6–29)
AST: 14 U/L (ref 10–35)
Albumin: 4.6 g/dL (ref 3.6–5.1)
Alkaline phosphatase (APISO): 75 U/L (ref 37–153)
BUN: 17 mg/dL (ref 7–25)
CO2: 32 mmol/L (ref 20–32)
Calcium: 10.5 mg/dL — ABNORMAL HIGH (ref 8.6–10.4)
Chloride: 100 mmol/L (ref 98–110)
Creat: 0.95 mg/dL (ref 0.60–0.95)
Globulin: 2.7 g/dL (calc) (ref 1.9–3.7)
Glucose, Bld: 142 mg/dL — ABNORMAL HIGH (ref 65–99)
Potassium: 4.2 mmol/L (ref 3.5–5.3)
Sodium: 141 mmol/L (ref 135–146)
Total Bilirubin: 0.8 mg/dL (ref 0.2–1.2)
Total Protein: 7.3 g/dL (ref 6.1–8.1)
eGFR: 61 mL/min/{1.73_m2} (ref >=60)

## 2021-04-09 LAB — LIPID PANEL
Cholesterol: 194 mg/dL (ref <200)
HDL: 87 mg/dL (ref >=50)
LDL Cholesterol (Calc): 84 mg/dL (calc)
Non-HDL Cholesterol (Calc): 107 mg/dL (calc) (ref <130)
Total CHOL/HDL Ratio: 2.2 (calc) (ref <5.0)
Triglycerides: 126 mg/dL (ref <150)

## 2021-04-09 LAB — HEMOGLOBIN A1C
Hgb A1c MFr Bld: 6.8 % of total Hgb — ABNORMAL HIGH (ref <5.7)
Mean Plasma Glucose: 148 mg/dL
eAG (mmol/L): 8.2 mmol/L

## 2021-04-09 LAB — TSH: TSH: 1.72 mIU/L (ref 0.40–4.50)

## 2021-04-09 LAB — VITAMIN D 25 HYDROXY (VIT D DEFICIENCY, FRACTURES): Vit D, 25-Hydroxy: 71 ng/mL (ref 30–100)

## 2021-04-09 LAB — MAGNESIUM: Magnesium: 2.1 mg/dL (ref 1.5–2.5)

## 2021-04-09 NOTE — Progress Notes (Addendum)
PCP - Judi Cong, NP LOV 04-08-21 epic Cardiologist - no   PPM/ICD -  Device Orders -  Rep Notified -   Chest x-ray - CT chest- 12-27-20 epic EKG - 12-28-20 epic Stress Test -  ECHO -  Cardiac Cath -  HgbA1c 04-08-21 epic, CBC/diff, CMP  Sleep Study -  CPAP -   Fasting Blood Sugar -  Checks Blood Sugar __some times___  Blood Thinner Instructions:  Aspirin Instructions: 81 mg stop one  week   ERAS Protcol - PRE-SURGERY G2-   COVID TEST- 04-15-21 COVID vaccine -yes  fully  Activity--Able to do ADL"s without SOB  Anesthesia review: HTN, murmur no echo  Patient denies shortness of breath, fever, cough and chest pain at PAT appointment   All instructions explained to the patient, with a verbal understanding of the material. Patient agrees to go over the instructions while at home for a better understanding. Patient also instructed to self quarantine after being tested for COVID-19. The opportunity to ask questions was provided.

## 2021-04-09 NOTE — Patient Instructions (Signed)
DUE TO COVID-19 ONLY ONE VISITOR IS ALLOWED TO COME WITH YOU AND STAY IN THE WAITING ROOM ONLY DURING PRE OP AND PROCEDURE DAY OF SURGERY.   Up to two visitors ages 16+ are allowed at one time in a patient's room.  The visitors may rotate out with other people throughout the day.  Additionally, up to two children between the ages of 56 and 63 are allowed and do not count toward the number of allowed visitors.  Children within this age range must be accompanied by an adult visitor.  One adult visitor may remain with the patient overnight and must be in the room by 8 PM.  YOU NEED TO HAVE A COVID 19 TEST ON_2-16-23  @   ______ THIS TEST MUST BE DONE BEFORE SURGERY,     COVID TESTING [place] COME IN THROUGH MAIN ENTRANCE BE SEATED INT THE LOBBY AREA TO THE RIGHT AS YOU COME IN THE MAIN ENTRANCE DIAL [phone] GIVE THEM YOUR NAME AND LET THEM KNOW YOU ARE HERE FOR COVID TESTING    ONCE YOUR COVID TEST IS COMPLETED,  PLEASE Wear a mask when in [place]           Your procedure is scheduled on: 04-19-21   Report to [facility] Main  Entrance   Report to admitting at       [place]  AM     Call this number if you have problems the morning of surgery [phone]   Remember: NO SOLID FOOD AFTER MIDNIGHT THE NIGHT PRIOR TO SURGERY. NOTHING BY MOUTH EXCEPT CLEAR LIQUIDS UNTIL   0415 am .  PLEASE FINISH G2  DRINK PER SURGEON ORDER  WHICH NEEDS TO BE COMPLETED AT     0415 am then nothing by mouth.      CLEAR LIQUID DIET                                                                    water Black Coffee and tea, regular and decaf No Creamer                            Plain Jell-O any favor except red or purple                                  Fruit ices (not with fruit pulp)                                      Iced Popsicles                                     Carbonated beverages, regular and diet                                     Cranberry, grape and apple juices Sports drinks like Gatorade Lightly  seasoned clear broth or consume(fat free) Sugar, honey syrup   _____________________________________________________________________    BRUSH YOUR TEETH MORNING OF SURGERY AND RINSE YOUR MOUTH OUT, NO CHEWING GUM CANDY OR MINTS.     Take these medicines the morning of surgery with A SIP OF WATER: Rosuvastatin, omeprazole, levothyroxine, gabapentin if needed  DO NOT TAKE ANY DIABETIC MEDICATIONS DAY OF YOUR SURGERY                               You may not have any metal on your body including hair pins and              piercings  Do not wear jewelry, make-up, lotions, powders,perfumes,        deodorant             Do not wear nail polish on your fingernails or toenails .  Do not shave  48 hours prior to surgery.              Do not bring valuables to the hospital. [place].  Contacts, dentures or bridgework may not be worn into surgery.  You may bring a small overnight bag with you     Patients discharged the day of surgery will not be allowed to drive home. IF YOU ARE HAVING SURGERY AND GOING HOME THE SAME DAY, YOU MUST HAVE AN ADULT TO DRIVE YOU HOME AND BE WITH YOU FOR 24 HOURS. YOU MAY GO HOME BY TAXI OR UBER OR ORTHERWISE, BUT AN ADULT MUST ACCOMPANY YOU HOME AND STAY WITH YOU FOR 24 HOURS.  Name and phone number of your driver:  Special Instructions: N/A              Please read over the following fact sheets you were given: _____________________________________________________________________             [facility] - Preparing for Surgery Before surgery, you can play an important role.  Because skin is not sterile, your skin needs to be as free of germs as possible.  You can reduce the number of germs on your skin by washing with CHG (chlorahexidine gluconate) soap before surgery.  CHG is an antiseptic cleaner which kills germs and bonds with the skin  to continue killing germs even after washing. Please DO NOT use if you have an allergy to CHG or antibacterial soaps.  If your skin becomes reddened/irritated stop using the CHG and inform your nurse when you arrive at Short Stay. Do not shave (including legs and underarms) for at least 48 hours prior to the first CHG shower.  You may shave your face/neck. Please follow these instructions carefully:  1.  Shower with CHG Soap the night before surgery and the  morning of Surgery.  2.  If you choose to wash your hair, wash your hair first as usual with your  normal  shampoo.  3.  After you shampoo, rinse your hair and body thoroughly to remove the  shampoo.                           4.  Use CHG as you would any other liquid soap.  You can apply chg directly  to the skin and wash  Gently with a scrungie or clean washcloth.  5.  Apply the CHG Soap to your body ONLY FROM THE NECK DOWN.   Do not use on face/ open                           Wound or open sores. Avoid contact with eyes, ears mouth and genitals (private parts).                       Wash face,  Genitals (private parts) with your normal soap.             6.  Wash thoroughly, paying special attention to the area where your surgery  will be performed.  7.  Thoroughly rinse your body with warm water from the neck down.  8.  DO NOT shower/wash with your normal soap after using and rinsing off  the CHG Soap.                9.  Pat yourself dry with a clean towel.            10.  Wear clean pajamas.            11.  Place clean sheets on your bed the night of your first shower and do not  sleep with pets. Day of Surgery : Do not apply any lotions/deodorants the morning of surgery.  Please wear clean clothes to the hospital/surgery center.  FAILURE TO FOLLOW THESE INSTRUCTIONS MAY RESULT IN THE CANCELLATION OF YOUR SURGERY PATIENT SIGNATURE_________________________________  NURSE  SIGNATURE__________________________________  ________________________________________________________________________    Adam Phenix  An incentive spirometer is a tool that can help keep your lungs clear and active. This tool measures how well you are filling your lungs with each breath. Taking long deep breaths may help reverse or decrease the chance of developing breathing (pulmonary) problems (especially infection) following: A long period of time when you are unable to move or be active. BEFORE THE PROCEDURE  If the spirometer includes an indicator to show your best effort, your nurse or respiratory therapist will set it to a desired goal. If possible, sit up straight or lean slightly forward. Try not to slouch. Hold the incentive spirometer in an upright position. INSTRUCTIONS FOR USE  Sit on the edge of your bed if possible, or sit up as far as you can in bed or on a chair. Hold the incentive spirometer in an upright position. Breathe out normally. Place the mouthpiece in your mouth and seal your lips tightly around it. Breathe in slowly and as deeply as possible, raising the piston or the ball toward the top of the column. Hold your breath for 3-5 seconds or for as long as possible. Allow the piston or ball to fall to the bottom of the column. Remove the mouthpiece from your mouth and breathe out normally. Rest for a few seconds and repeat Steps 1 through 7 at least 10 times every 1-2 hours when you are awake. Take your time and take a few normal breaths between deep breaths. The spirometer may include an indicator to show your best effort. Use the indicator as a goal to work toward during each repetition. After each set of 10 deep breaths, practice coughing to be sure your lungs are clear. If you have an incision (the cut made at the time of surgery), support your incision when coughing by placing a pillow or rolled up towels  firmly against it. Once you are able to get out of  bed, walk around indoors and cough well. You may stop using the incentive spirometer when instructed by your caregiver.  RISKS AND COMPLICATIONS Take your time so you do not get dizzy or light-headed. If you are in pain, you may need to take or ask for pain medication before doing incentive spirometry. It is harder to take a deep breath if you are having pain. AFTER USE Rest and breathe slowly and easily. It can be helpful to keep track of a log of your progress. Your caregiver can provide you with a simple table to help with this. If you are using the spirometer at home, follow these instructions: [place] IF:  You are having difficultly using the spirometer. You have trouble using the spirometer as often as instructed. Your pain medication is not giving enough relief while using the spirometer. You develop fever of 100.5 F (38.1 C) or higher. SEEK IMMEDIATE MEDICAL CARE IF:  You cough up bloody sputum that had not been present before. You develop fever of 102 F (38.9 C) or greater. You develop worsening pain at or near the incision site. MAKE SURE YOU:  Understand these instructions. Will watch your condition. Will get help right away if you are not doing well or get worse. Document Released: 06/27/2006 Document Revised: 05/09/2011 Document Reviewed: 08/28/2006 [facility] Patient Information 2014 [place], [place].   ________________________________________________________________________

## 2021-04-12 ENCOUNTER — Encounter (HOSPITAL_COMMUNITY)
Admission: RE | Admit: 2021-04-12 | Discharge: 2021-04-12 | Disposition: A | Discharge status: Home / Self Care | Payer: Medicare Other | Source: Ambulatory Visit | Attending: Orthopedic Surgery | Admitting: Orthopedic Surgery

## 2021-04-12 ENCOUNTER — Other Ambulatory Visit: Payer: Self-pay

## 2021-04-12 ENCOUNTER — Encounter (HOSPITAL_COMMUNITY): Payer: Self-pay

## 2021-04-12 VITALS — BP 144/74 | HR 59 | Temp 98.2°F | Resp 18 | Ht 68.0 in | Wt 225.0 lb

## 2021-04-12 DIAGNOSIS — Z01818 Encounter for other preprocedural examination: Secondary | ICD-10-CM

## 2021-04-12 DIAGNOSIS — Z01812 Encounter for preprocedural laboratory examination: Secondary | ICD-10-CM | POA: Insufficient documentation

## 2021-04-12 DIAGNOSIS — E119 Type 2 diabetes mellitus without complications: Secondary | ICD-10-CM | POA: Diagnosis not present

## 2021-04-12 HISTORY — DX: Type 2 diabetes mellitus without complications: E11.9

## 2021-04-12 HISTORY — DX: Myoneural disorder, unspecified: G70.9

## 2021-04-12 HISTORY — DX: Cardiac murmur, unspecified: R01.1

## 2021-04-12 LAB — GLUCOSE, CAPILLARY: Glucose-Capillary: 129 mg/dL — ABNORMAL HIGH (ref 70–99)

## 2021-04-12 LAB — SURGICAL PCR SCREEN
MRSA, PCR: NEGATIVE
Staphylococcus aureus: NEGATIVE

## 2021-04-12 LAB — C-REACTIVE PROTEIN: CRP: 0.6 mg/dL (ref <1.0)

## 2021-04-12 LAB — SEDIMENTATION RATE: Sed Rate: 8 mm/hr (ref 0–22)

## 2021-04-14 NOTE — H&P (Signed)
TOTAL HIP REVISION ADMISSION H&P  Patient is admitted for right revision total hip arthroplasty.  Subjective:  Chief Complaint: right hip pain  HPI: Rebecca Graves, 81 y.o. female, has a history of pain and functional disability in the right hip due to  loosening of acetabular component  and patient has failed non-surgical conservative treatments for greater than 12 weeks to include NSAID's and/or analgesics, use of assistive devices, and activity modification. The indications for the revision total hip arthroplasty are loosening of one or more components.  Onset of symptoms was abrupt starting 1 years ago with rapidlly worsening course since that time.  Prior procedures on the right hip include arthroplasty.  Patient currently rates pain in the right hip at 10 out of 10 with activity.  There is night pain, worsening of pain with activity and weight bearing, trendelenberg gait, pain that interfers with activities of daily living, and pain with passive range of motion. Patient has evidence of prosthetic loosening by imaging studies.  This condition presents safety issues increasing the risk of falls.   There is no current active infection.  Patient Active Problem List   Diagnosis Date Noted   OAB (overactive bladder) 04/08/2021   Diet-controlled diabetes mellitus ([place]) 06/19/2018   Insomnia 10/26/2017   Diabetic peripheral neuropathy ([place]) 01/18/2015   CKD stage 2 due to type 2 diabetes mellitus ([place]) 01/18/2015   Morbid obesity ([place]) BMI 35 associated with htn, hyperlipidemia, arthritis, T2DM 07/03/2014   Vitamin D deficiency 03/31/2013   Medication management 03/31/2013   DJD    Glaucoma    Hypothyroidism    Hypertension    Hyperlipidemia associated with type 2 diabetes mellitus ([place])    GERD (gastroesophageal reflux disease)    Past Medical History:  Diagnosis Date   Anal fissure    Arthritis    Carotid stenosis, <50%, bilateral  12/20/2018   Colon polyps    hyperplastic    Diabetes mellitus without complication (HCC)    diet controlled   Diverticulosis    GERD (gastroesophageal reflux disease)    Glaucoma    Heart murmur    no problems   Hemorrhoids    Hyperlipidemia    Hypertension    Hypothyroidism    Neuromuscular disorder ([place])    neuropathy   Tear of lateral meniscus of knee, current 03/27/2011    Past Surgical History:  Procedure Laterality Date   ABDOMINAL HYSTERECTOMY  1988   In [place]  2007   rt hip replacement   KNEE ARTHROSCOPY  03/28/2011   Procedure: ARTHROSCOPY KNEE;  Surgeon: Kerin Salen, MD;  Location: [place];  Service: Orthopedics;  Laterality: Right;  with partial lateral meniscectomy, Debridement of Chondromalacia   right knee srthroplasty      No current facility-administered medications for this encounter.   Current Outpatient Medications  Medication Sig Dispense Refill Last Dose   acetaminophen (TYLENOL) 500 MG tablet Take 1,000 mg by mouth every 6 (six) hours as needed for moderate pain.      aspirin 81 MG tablet Take 81 mg by mouth daily.      Biotin w/ Vitamins C & E (HAIR SKIN & NAILS GUMMIES PO) Take 1 tablet by mouth in the morning and at bedtime.      bisoprolol-hydrochlorothiazide (ZIAC) 10-6.25 MG tablet TAKE 1 TABLET BY MOUTH  DAILY FOR BLOOD PRESSURE 90 tablet  3    Cholecalciferol (VITAMIN D) 50 MCG (2000 UT) tablet Take 4,000 Units by mouth daily.      docusate sodium (COLACE) 100 MG capsule Take 100 mg by mouth 2 (two) times daily.      furosemide (LASIX) 40 MG tablet Take 1 tablet  Daily  for BP & Fluid Retention /Ankle Swelling 90 tablet 3    gabapentin (NEURONTIN) 600 MG tablet TAKE 1/2 TO 1 TABLET BY  MOUTH 2 TO 3 TIMES DAILY AS NEEDED FOR PAIN 270 tablet 3    latanoprost (XALATAN) 0.005 % ophthalmic solution Place 1 drop into both eyes at bedtime.      levothyroxine (SYNTHROID) 100 MCG tablet TAKE 1TAB DAILY ON AN  EMPTY STOMACH WITH ONLY WATER FOR 30 MINS, &amp; NO ANTACID MEDS, CALCIUM OR MAGNESIUM FOR 4  HOURS, &amp; AVOID BIOTI 90 tablet 3    magnesium oxide (MAG-OX) 400 MG tablet Take 400 mg by mouth 2 (two) times daily.      omeprazole (PRILOSEC) 20 MG capsule Take 1 Capsule  2 x/day (every 12  hours) to  Prevent  Heartburn  & Indigestion / Patient knows to take by mouth  ! (Patient taking differently: Take 20 mg by mouth daily.) 180 capsule 3    rosuvastatin (CRESTOR) 5 MG tablet TAKE 1 TABLET BY MOUTH 3  TIMES PER WEEK ON MONDAY,  WEDNESDAY, AND FRIDAY FOR  CHOLESTEROL 39 tablet 3    Trypsin-Castor Oil-[place] Balsam (OPTASE EX) Place 1 application into both eyes daily.      vitamin B-12 (CYANOCOBALAMIN) 1000 MCG tablet Take 1,000 mcg by mouth daily.      Alcohol Swabs (B-D SINGLE USE SWABS REGULAR) PADS Use 1 swab prior to checking blood sugar daily-DX-E11.9 100 each 3    Blood Glucose Calibration (TRUE METRIX LEVEL 1) Low SOLN Check blood sugar 1 time daily-DX-E11.9. 3 each 3    Blood Glucose Monitoring Suppl (TRUE METRIX METER) w/Device KIT Check blood sugar 1 time daily-DX-E11.9 1 kit 0    glucose blood test strip Check blood sugar 1 time a day-DX-E11.9. 100 each 3    Lancets 28G MISC Check blood sugar 1 time a day-DX-E11.9 100 each 3    phentermine (ADIPEX-P) 37.5 MG tablet Take 1/2 to 1 tablet every Morning for Dieting & Weight Loss (Patient not taking: Reported on 04/06/2021) 90 tablet 1 Not Taking   topiramate (TOPAMAX) 50 MG tablet Take 1/2 to 1 tablet 2 x /day at Suppertime & Bedtime for Dieting & Weight Loss (Patient not taking: Reported on 01/05/2021) 180 tablet 1    Allergies  Allergen Reactions   Meloxicam Swelling    Weight gain    Social History   Tobacco Use   Smoking status: Former    Types: Cigarettes    Quit date: 03/22/1996    Years since quitting: 25.0   Smokeless tobacco: Never  Substance Use Topics   Alcohol use: Yes    Alcohol/week: 2.0 standard drinks    Types: 2  drink(s) per week    Comment: occ    Family History  Problem Relation Age of Onset   Esophageal cancer Other        nefew- age 56   Colon polyps Sister    Diabetes Sister    Esophageal cancer Other    Colon cancer Neg Hx    Rectal cancer Neg Hx    Stomach cancer Neg Hx    Breast cancer Neg Hx  Review of Systems  Constitutional:  Positive for diaphoresis.  HENT: Negative.    Eyes: Negative.   Respiratory: Negative.    Cardiovascular:  Positive for leg swelling.       HTN  Endocrine: Negative.   Genitourinary: Negative.   Musculoskeletal:  Positive for arthralgias and myalgias.  Skin: Negative.   Allergic/Immunologic: Negative.   Neurological: Negative.   Hematological: Negative.   Psychiatric/Behavioral: Negative.     Objective:  Physical Exam Constitutional:      Appearance: Normal appearance. She is obese.  HENT:     Head: Normocephalic and atraumatic.  Eyes:     Pupils: Pupils are equal, round, and reactive to light.  Cardiovascular:     Pulses: Normal pulses.  Pulmonary:     Effort: Pulmonary effort is normal.  Musculoskeletal:        General: Tenderness present.     Cervical back: Normal range of motion and neck supple.     Comments: .  Internal rotation does reveal some irritability of the right hip hip is about a quadrant shorter than the contralateral left side which has not been replaced.  Good power to testing of hip flexors extensors abductors and abductors  Skin:    General: Skin is warm and dry.  Neurological:     General: No focal deficit present.     Mental Status: She is alert and oriented to person, place, and time. Mental status is at baseline.  Psychiatric:        Mood and Affect: Mood normal.        Behavior: Behavior normal.        Thought Content: Thought content normal.        Judgment: Judgment normal.    Vital signs in last 24 hours:     Labs:   Estimated body mass index is 34.21 kg/m as calculated from the  following:   Height as of 04/12/21: _0  (1.727 m).   Weight as of 04/12/21: 102.1 kg.  Imaging Review:  Plain radiographs demonstrate AP pelvis and crosstable lateral shows that the ASR shell has migrated medially about 8 or 9 mm and is mildly protrusio at this time fortunately the abduction angle has not changed in the anteversion appears to be the same as it was back in 2009.    Assessment/Plan:  End stage arthritis, right hip(s) with failed previous arthroplasty.  The patient history, physical examination, clinical judgement of the provider and imaging studies are consistent with end stage degenerative joint disease of the right hip(s), previous total hip arthroplasty. Revision total hip arthroplasty is deemed medically necessary. The treatment options including medical management, injection therapy, arthroscopy and arthroplasty were discussed at length. The risks and benefits of total hip arthroplasty were presented and reviewed. The risks due to aseptic loosening, infection, stiffness, dislocation/subluxation,  thromboembolic complications and other imponderables were discussed.  The patient acknowledged the explanation, agreed to proceed with the plan and consent was signed. Patient is being admitted for inpatient treatment for surgery, pain control, PT, OT, prophylactic antibiotics, VTE prophylaxis, progressive ambulation and ADL's and discharge planning. The patient is planning to be discharged home with home health services

## 2021-04-15 ENCOUNTER — Other Ambulatory Visit: Payer: Self-pay

## 2021-04-15 ENCOUNTER — Encounter (HOSPITAL_COMMUNITY)
Admission: RE | Admit: 2021-04-15 | Discharge: 2021-04-15 | Disposition: A | Discharge status: Home / Self Care | Payer: Medicare Other | Source: Ambulatory Visit | Attending: Orthopedic Surgery | Admitting: Orthopedic Surgery

## 2021-04-15 DIAGNOSIS — Z20822 Contact with and (suspected) exposure to covid-19: Secondary | ICD-10-CM | POA: Diagnosis not present

## 2021-04-15 DIAGNOSIS — Z01812 Encounter for preprocedural laboratory examination: Secondary | ICD-10-CM | POA: Diagnosis not present

## 2021-04-15 LAB — SARS CORONAVIRUS 2 (TAT 6-24 HRS): SARS Coronavirus 2: NEGATIVE

## 2021-04-18 NOTE — Anesthesia Preprocedure Evaluation (Addendum)
Anesthesia Evaluation  Patient identified by MRN, date of birth, ID band  Reviewed: NPO status   Airway Mallampati: II  TM Distance: >3 FB     Dental   Pulmonary former smoker,    breath sounds clear to auscultation       Cardiovascular hypertension,  Rhythm:Regular Rate:Normal     Neuro/Psych    GI/Hepatic GERD  ,  Endo/Other  diabetesHypothyroidism   Renal/GU Renal disease     Musculoskeletal   Abdominal   Peds  Hematology   Anesthesia Other Findings   Reproductive/Obstetrics                            Anesthesia Physical Anesthesia Plan  ASA: 3  Anesthesia Plan: General   Post-op Pain Management:    Induction:   PONV Risk Score and Plan: Ondansetron  Airway Management Planned: Video Laryngoscope Planned  Additional Equipment:   Intra-op Plan:   Post-operative Plan: Extubation in OR  Informed Consent: I have reviewed the patients History and Physical, chart, labs and discussed the procedure including the risks, benefits and alternatives for the proposed anesthesia with the patient or authorized representative who has indicated his/her understanding and acceptance.     Dental advisory given  Plan Discussed with: Anesthesiologist and CRNA  Anesthesia Plan Comments:        Anesthesia Quick Evaluation

## 2021-04-19 ENCOUNTER — Inpatient Hospital Stay (HOSPITAL_COMMUNITY)
Admission: RE | Admit: 2021-04-19 | Discharge: 2021-04-20 | DRG: 468 | Disposition: A | Discharge status: Home Health | Payer: Medicare Other | Attending: Orthopedic Surgery | Admitting: Orthopedic Surgery

## 2021-04-19 ENCOUNTER — Encounter (HOSPITAL_COMMUNITY)
Admission: RE | Disposition: A | Discharge status: Home Health | Payer: Self-pay | Source: Home / Self Care | Attending: Orthopedic Surgery

## 2021-04-19 ENCOUNTER — Other Ambulatory Visit: Payer: Self-pay

## 2021-04-19 ENCOUNTER — Inpatient Hospital Stay (HOSPITAL_COMMUNITY): Payer: Medicare Other | Admitting: Physician Assistant

## 2021-04-19 ENCOUNTER — Inpatient Hospital Stay (HOSPITAL_COMMUNITY): Payer: Medicare Other

## 2021-04-19 ENCOUNTER — Other Ambulatory Visit (HOSPITAL_COMMUNITY): Payer: Self-pay

## 2021-04-19 ENCOUNTER — Encounter (HOSPITAL_COMMUNITY): Payer: Self-pay | Admitting: Orthopedic Surgery

## 2021-04-19 DIAGNOSIS — E785 Hyperlipidemia, unspecified: Secondary | ICD-10-CM | POA: Diagnosis not present

## 2021-04-19 DIAGNOSIS — N182 Chronic kidney disease, stage 2 (mild): Secondary | ICD-10-CM | POA: Diagnosis not present

## 2021-04-19 DIAGNOSIS — H409 Unspecified glaucoma: Secondary | ICD-10-CM | POA: Diagnosis not present

## 2021-04-19 DIAGNOSIS — Z888 Allergy status to other drugs, medicaments and biological substances status: Secondary | ICD-10-CM

## 2021-04-19 DIAGNOSIS — Z90710 Acquired absence of both cervix and uterus: Secondary | ICD-10-CM

## 2021-04-19 DIAGNOSIS — Z6834 Body mass index (BMI) 34.0-34.9, adult: Secondary | ICD-10-CM

## 2021-04-19 DIAGNOSIS — E119 Type 2 diabetes mellitus without complications: Secondary | ICD-10-CM | POA: Diagnosis not present

## 2021-04-19 DIAGNOSIS — Z7982 Long term (current) use of aspirin: Secondary | ICD-10-CM

## 2021-04-19 DIAGNOSIS — Z9049 Acquired absence of other specified parts of digestive tract: Secondary | ICD-10-CM | POA: Diagnosis not present

## 2021-04-19 DIAGNOSIS — T84030A Mechanical loosening of internal right hip prosthetic joint, initial encounter: Principal | ICD-10-CM | POA: Diagnosis present

## 2021-04-19 DIAGNOSIS — K219 Gastro-esophageal reflux disease without esophagitis: Secondary | ICD-10-CM | POA: Diagnosis not present

## 2021-04-19 DIAGNOSIS — Z96641 Presence of right artificial hip joint: Secondary | ICD-10-CM | POA: Diagnosis not present

## 2021-04-19 DIAGNOSIS — D62 Acute posthemorrhagic anemia: Secondary | ICD-10-CM | POA: Diagnosis not present

## 2021-04-19 DIAGNOSIS — E039 Hypothyroidism, unspecified: Secondary | ICD-10-CM | POA: Diagnosis not present

## 2021-04-19 DIAGNOSIS — Z79899 Other long term (current) drug therapy: Secondary | ICD-10-CM

## 2021-04-19 DIAGNOSIS — Z833 Family history of diabetes mellitus: Secondary | ICD-10-CM | POA: Diagnosis not present

## 2021-04-19 DIAGNOSIS — Z79890 Hormone replacement therapy (postmenopausal): Secondary | ICD-10-CM | POA: Diagnosis not present

## 2021-04-19 DIAGNOSIS — Z87891 Personal history of nicotine dependence: Secondary | ICD-10-CM

## 2021-04-19 DIAGNOSIS — E669 Obesity, unspecified: Secondary | ICD-10-CM | POA: Diagnosis present

## 2021-04-19 DIAGNOSIS — Y792 Prosthetic and other implants, materials and accessory orthopedic devices associated with adverse incidents: Secondary | ICD-10-CM | POA: Diagnosis present

## 2021-04-19 DIAGNOSIS — Z86010 Personal history of colonic polyps: Secondary | ICD-10-CM | POA: Diagnosis not present

## 2021-04-19 DIAGNOSIS — G4700 Insomnia, unspecified: Secondary | ICD-10-CM | POA: Diagnosis not present

## 2021-04-19 DIAGNOSIS — I10 Essential (primary) hypertension: Secondary | ICD-10-CM | POA: Diagnosis not present

## 2021-04-19 DIAGNOSIS — M1611 Unilateral primary osteoarthritis, right hip: Secondary | ICD-10-CM | POA: Diagnosis not present

## 2021-04-19 DIAGNOSIS — E1142 Type 2 diabetes mellitus with diabetic polyneuropathy: Secondary | ICD-10-CM | POA: Diagnosis present

## 2021-04-19 DIAGNOSIS — E1122 Type 2 diabetes mellitus with diabetic chronic kidney disease: Secondary | ICD-10-CM | POA: Diagnosis present

## 2021-04-19 DIAGNOSIS — I129 Hypertensive chronic kidney disease with stage 1 through stage 4 chronic kidney disease, or unspecified chronic kidney disease: Secondary | ICD-10-CM | POA: Diagnosis present

## 2021-04-19 DIAGNOSIS — Z471 Aftercare following joint replacement surgery: Secondary | ICD-10-CM | POA: Diagnosis not present

## 2021-04-19 HISTORY — PX: TOTAL HIP REVISION: SHX763

## 2021-04-19 LAB — TYPE AND SCREEN
ABO/RH(D): AB POS
Antibody Screen: NEGATIVE

## 2021-04-19 LAB — GLUCOSE, CAPILLARY
Glucose-Capillary: 173 mg/dL — ABNORMAL HIGH (ref 70–99)
Glucose-Capillary: 159 mg/dL — ABNORMAL HIGH (ref 70–99)
Glucose-Capillary: 129 mg/dL — ABNORMAL HIGH (ref 70–99)

## 2021-04-19 SURGERY — TOTAL HIP REVISION
Anesthesia: General | Site: Hip | Laterality: Right

## 2021-04-19 MED ORDER — TIZANIDINE HCL 2 MG PO TABS
2.0000 mg | ORAL_TABLET | Freq: Four times a day (QID) | ORAL | 0 refills | Status: DC | PRN
  Filled 2021-04-19: qty 60, 15d supply, fill #0

## 2021-04-19 MED ORDER — METHOCARBAMOL 500 MG PO TABS
500.0000 mg | ORAL_TABLET | Freq: Four times a day (QID) | ORAL | Status: DC | PRN
  Administered 2021-04-19 – 2021-04-20 (×4): 500 mg via ORAL
  Filled 2021-04-19 (×4): qty 1

## 2021-04-19 MED ORDER — DIPHENHYDRAMINE HCL 12.5 MG/5ML PO ELIX: 12.5000 mg | ORAL_SOLUTION | ORAL | Status: DC | PRN

## 2021-04-19 MED ORDER — MENTHOL 3 MG MT LOZG: 1.0000 | LOZENGE | OROMUCOSAL | Status: DC | PRN

## 2021-04-19 MED ORDER — FENTANYL CITRATE PF 50 MCG/ML IJ SOSY
25.0000 ug | PREFILLED_SYRINGE | INTRAMUSCULAR | Status: DC | PRN
  Administered 2021-04-19 (×4): 25 ug via INTRAVENOUS

## 2021-04-19 MED ORDER — SODIUM CHLORIDE (PF) 0.9 % IJ SOLN
INTRAMUSCULAR | Status: AC
  Filled 2021-04-19: qty 10

## 2021-04-19 MED ORDER — BUPIVACAINE LIPOSOME 1.3 % IJ SUSP: 10.0000 mL | Freq: Once | INTRAMUSCULAR | Status: DC

## 2021-04-19 MED ORDER — BUPIVACAINE LIPOSOME 1.3 % IJ SUSP
INTRAMUSCULAR | Status: AC
  Filled 2021-04-19: qty 10

## 2021-04-19 MED ORDER — FENTANYL CITRATE PF 50 MCG/ML IJ SOSY
PREFILLED_SYRINGE | INTRAMUSCULAR | Status: AC
  Filled 2021-04-19: qty 1

## 2021-04-19 MED ORDER — ONDANSETRON HCL 4 MG PO TABS: 4.0000 mg | ORAL_TABLET | Freq: Four times a day (QID) | ORAL | Status: DC | PRN

## 2021-04-19 MED ORDER — METOCLOPRAMIDE HCL 5 MG/ML IJ SOLN: 5.0000 mg | Freq: Three times a day (TID) | INTRAMUSCULAR | Status: DC | PRN

## 2021-04-19 MED ORDER — METHOCARBAMOL 1000 MG/10ML IJ SOLN
500.0000 mg | Freq: Four times a day (QID) | INTRAVENOUS | Status: DC | PRN
  Filled 2021-04-19: qty 5

## 2021-04-19 MED ORDER — PANTOPRAZOLE SODIUM 40 MG PO TBEC
40.0000 mg | DELAYED_RELEASE_TABLET | Freq: Every day | ORAL | Status: DC
Start: 2021-04-20 — End: 2021-04-20
  Administered 2021-04-20: 40 mg via ORAL
  Filled 2021-04-19: qty 1

## 2021-04-19 MED ORDER — ORAL CARE MOUTH RINSE: 15.0000 mL | Freq: Once | OROMUCOSAL | Status: AC

## 2021-04-19 MED ORDER — BUPIVACAINE-EPINEPHRINE (PF) 0.25% -1:200000 IJ SOLN
INTRAMUSCULAR | Status: AC
  Filled 2021-04-19: qty 30

## 2021-04-19 MED ORDER — LACTATED RINGERS IV SOLN: INTRAVENOUS | Status: DC

## 2021-04-19 MED ORDER — BISACODYL 5 MG PO TBEC: 5.0000 mg | DELAYED_RELEASE_TABLET | Freq: Every day | ORAL | Status: DC | PRN

## 2021-04-19 MED ORDER — PHENYLEPHRINE HCL (PRESSORS) 10 MG/ML IV SOLN
INTRAVENOUS | Status: AC
  Filled 2021-04-19: qty 1

## 2021-04-19 MED ORDER — ROCURONIUM BROMIDE 10 MG/ML (PF) SYRINGE
PREFILLED_SYRINGE | INTRAVENOUS | Status: DC | PRN
Start: 2021-04-19 — End: 2021-04-19
  Administered 2021-04-19: 80 mg via INTRAVENOUS

## 2021-04-19 MED ORDER — CEFAZOLIN SODIUM-DEXTROSE 2-4 GM/100ML-% IV SOLN
2.0000 g | INTRAVENOUS | Status: AC
  Administered 2021-04-19: 2 g via INTRAVENOUS
  Filled 2021-04-19: qty 100

## 2021-04-19 MED ORDER — DEXAMETHASONE SODIUM PHOSPHATE 10 MG/ML IJ SOLN
10.0000 mg | Freq: Once | INTRAMUSCULAR | Status: AC
  Administered 2021-04-20: 10 mg via INTRAVENOUS
  Filled 2021-04-19: qty 1

## 2021-04-19 MED ORDER — GLYCOPYRROLATE 0.2 MG/ML IJ SOLN
0.2000 mg | Freq: Once | INTRAMUSCULAR | Status: AC
  Administered 2021-04-19: 0.2 mg via INTRAVENOUS

## 2021-04-19 MED ORDER — FENTANYL CITRATE (PF) 100 MCG/2ML IJ SOLN
INTRAMUSCULAR | Status: DC | PRN
  Administered 2021-04-19: 50 ug via INTRAVENOUS
  Administered 2021-04-19: 25 ug via INTRAVENOUS
  Administered 2021-04-19: 50 ug via INTRAVENOUS

## 2021-04-19 MED ORDER — ASPIRIN EC 81 MG PO TBEC
81.0000 mg | DELAYED_RELEASE_TABLET | Freq: Two times a day (BID) | ORAL | 0 refills | Status: DC
  Filled 2021-04-19: qty 60, 30d supply, fill #0

## 2021-04-19 MED ORDER — TRANEXAMIC ACID 1000 MG/10ML IV SOLN
INTRAVENOUS | Status: DC | PRN
  Administered 2021-04-19: 2000 mg via TOPICAL

## 2021-04-19 MED ORDER — TRANEXAMIC ACID 1000 MG/10ML IV SOLN
2000.0000 mg | INTRAVENOUS | Status: DC
  Filled 2021-04-19: qty 20

## 2021-04-19 MED ORDER — POVIDONE-IODINE 10 % EX SWAB
2.0000 "application " | Freq: Once | CUTANEOUS | Status: AC
  Administered 2021-04-19: 2 via TOPICAL

## 2021-04-19 MED ORDER — DEXAMETHASONE SODIUM PHOSPHATE 10 MG/ML IJ SOLN
INTRAMUSCULAR | Status: AC
  Filled 2021-04-19: qty 1

## 2021-04-19 MED ORDER — LATANOPROST 0.005 % OP SOLN
1.0000 [drp] | Freq: Every day | OPHTHALMIC | Status: DC
  Administered 2021-04-19: 1 [drp] via OPHTHALMIC
  Filled 2021-04-19: qty 2.5

## 2021-04-19 MED ORDER — GLYCOPYRROLATE 0.2 MG/ML IJ SOLN
INTRAMUSCULAR | Status: AC
  Filled 2021-04-19: qty 1

## 2021-04-19 MED ORDER — PHENOL 1.4 % MT LIQD: 1.0000 | OROMUCOSAL | Status: DC | PRN

## 2021-04-19 MED ORDER — GABAPENTIN 300 MG PO CAPS
300.0000 mg | ORAL_CAPSULE | Freq: Three times a day (TID) | ORAL | Status: DC
  Administered 2021-04-19 – 2021-04-20 (×3): 300 mg via ORAL
  Filled 2021-04-19 (×3): qty 1

## 2021-04-19 MED ORDER — ONDANSETRON HCL 4 MG/2ML IJ SOLN
INTRAMUSCULAR | Status: DC | PRN
  Administered 2021-04-19: 4 mg via INTRAVENOUS

## 2021-04-19 MED ORDER — ONDANSETRON HCL 4 MG/2ML IJ SOLN: 4.0000 mg | Freq: Four times a day (QID) | INTRAMUSCULAR | Status: DC | PRN

## 2021-04-19 MED ORDER — ACETAMINOPHEN 325 MG PO TABS: 325.0000 mg | ORAL_TABLET | Freq: Four times a day (QID) | ORAL | Status: DC | PRN

## 2021-04-19 MED ORDER — SUCCINYLCHOLINE CHLORIDE 200 MG/10ML IV SOSY
PREFILLED_SYRINGE | INTRAVENOUS | Status: DC | PRN
  Administered 2021-04-19: 120 mg via INTRAVENOUS

## 2021-04-19 MED ORDER — TRANEXAMIC ACID-NACL 1000-0.7 MG/100ML-% IV SOLN
1000.0000 mg | INTRAVENOUS | Status: AC
  Administered 2021-04-19: 1000 mg via INTRAVENOUS
  Filled 2021-04-19: qty 100

## 2021-04-19 MED ORDER — HYDROMORPHONE HCL 1 MG/ML IJ SOLN
0.5000 mg | INTRAMUSCULAR | Status: DC | PRN
  Administered 2021-04-19: 1 mg via INTRAVENOUS
  Filled 2021-04-19: qty 1

## 2021-04-19 MED ORDER — ONDANSETRON HCL 4 MG/2ML IJ SOLN
INTRAMUSCULAR | Status: AC
  Filled 2021-04-19: qty 2

## 2021-04-19 MED ORDER — FENTANYL CITRATE (PF) 100 MCG/2ML IJ SOLN
INTRAMUSCULAR | Status: AC
Start: 2021-04-19 — End: ?
  Filled 2021-04-19: qty 2

## 2021-04-19 MED ORDER — ACETAMINOPHEN 10 MG/ML IV SOLN
INTRAVENOUS | Status: AC
  Filled 2021-04-19: qty 100

## 2021-04-19 MED ORDER — CHLORHEXIDINE GLUCONATE 0.12 % MT SOLN
15.0000 mL | Freq: Once | OROMUCOSAL | Status: AC
  Administered 2021-04-19: 15 mL via OROMUCOSAL

## 2021-04-19 MED ORDER — DOCUSATE SODIUM 100 MG PO CAPS
100.0000 mg | ORAL_CAPSULE | Freq: Two times a day (BID) | ORAL | Status: DC
  Administered 2021-04-19 – 2021-04-20 (×2): 100 mg via ORAL
  Filled 2021-04-19 (×2): qty 1

## 2021-04-19 MED ORDER — PROPOFOL 10 MG/ML IV BOLUS
INTRAVENOUS | Status: DC | PRN
  Administered 2021-04-19: 150 mg via INTRAVENOUS

## 2021-04-19 MED ORDER — LIDOCAINE 2% (20 MG/ML) 5 ML SYRINGE
INTRAMUSCULAR | Status: DC | PRN
Start: 2021-04-19 — End: 2021-04-19
  Administered 2021-04-19: 100 mg via INTRAVENOUS

## 2021-04-19 MED ORDER — BUPIVACAINE-EPINEPHRINE 0.25% -1:200000 IJ SOLN
INTRAMUSCULAR | Status: DC | PRN
  Administered 2021-04-19: 30 mL

## 2021-04-19 MED ORDER — ASPIRIN 81 MG PO CHEW
81.0000 mg | CHEWABLE_TABLET | Freq: Two times a day (BID) | ORAL | Status: DC
  Administered 2021-04-19 – 2021-04-20 (×2): 81 mg via ORAL
  Filled 2021-04-19 (×2): qty 1

## 2021-04-19 MED ORDER — PHENYLEPHRINE HCL-NACL 20-0.9 MG/250ML-% IV SOLN
INTRAVENOUS | Status: DC | PRN
  Administered 2021-04-19: 30 ug/min via INTRAVENOUS

## 2021-04-19 MED ORDER — KCL IN DEXTROSE-NACL 20-5-0.45 MEQ/L-%-% IV SOLN
INTRAVENOUS | Status: DC
  Filled 2021-04-19 (×4): qty 1000

## 2021-04-19 MED ORDER — PROPOFOL 10 MG/ML IV BOLUS
INTRAVENOUS | Status: AC
Start: 2021-04-19 — End: ?
  Filled 2021-04-19: qty 20

## 2021-04-19 MED ORDER — FLEET ENEMA 7-19 GM/118ML RE ENEM
1.0000 | ENEMA | Freq: Once | RECTAL | Status: DC | PRN
Start: 2021-04-19 — End: 2021-04-20

## 2021-04-19 MED ORDER — FUROSEMIDE 40 MG PO TABS
40.0000 mg | ORAL_TABLET | Freq: Every day | ORAL | Status: DC
  Administered 2021-04-19: 40 mg via ORAL
  Filled 2021-04-19: qty 1

## 2021-04-19 MED ORDER — METOCLOPRAMIDE HCL 5 MG PO TABS: 5.0000 mg | ORAL_TABLET | Freq: Three times a day (TID) | ORAL | Status: DC | PRN

## 2021-04-19 MED ORDER — ACETAMINOPHEN 10 MG/ML IV SOLN
INTRAVENOUS | Status: DC | PRN
  Administered 2021-04-19 (×2): 1000 mg via INTRAVENOUS

## 2021-04-19 MED ORDER — OXYCODONE HCL 5 MG PO TABS
5.0000 mg | ORAL_TABLET | ORAL | Status: DC | PRN
  Administered 2021-04-19: 10 mg via ORAL
  Administered 2021-04-20: 5 mg via ORAL
  Administered 2021-04-20: 10 mg via ORAL
  Filled 2021-04-19: qty 2
  Filled 2021-04-19 (×2): qty 1
  Filled 2021-04-19 (×2): qty 2

## 2021-04-19 MED ORDER — LEVOTHYROXINE SODIUM 100 MCG PO TABS
100.0000 ug | ORAL_TABLET | Freq: Every day | ORAL | Status: DC
  Administered 2021-04-20: 100 ug via ORAL
  Filled 2021-04-19: qty 1

## 2021-04-19 MED ORDER — ROCURONIUM BROMIDE 10 MG/ML (PF) SYRINGE
PREFILLED_SYRINGE | INTRAVENOUS | Status: AC
  Filled 2021-04-19: qty 10

## 2021-04-19 MED ORDER — FENTANYL CITRATE (PF) 100 MCG/2ML IJ SOLN
INTRAMUSCULAR | Status: AC
  Filled 2021-04-19: qty 2

## 2021-04-19 MED ORDER — ACETAMINOPHEN 500 MG PO TABS
1000.0000 mg | ORAL_TABLET | Freq: Four times a day (QID) | ORAL | Status: AC
  Administered 2021-04-19 – 2021-04-20 (×3): 1000 mg via ORAL
  Filled 2021-04-19 (×4): qty 2

## 2021-04-19 MED ORDER — BISOPROLOL-HYDROCHLOROTHIAZIDE 10-6.25 MG PO TABS
1.0000 | ORAL_TABLET | Freq: Every day | ORAL | Status: DC
  Administered 2021-04-19 – 2021-04-20 (×2): 1 via ORAL
  Filled 2021-04-19 (×2): qty 1

## 2021-04-19 MED ORDER — OXYCODONE HCL 5 MG PO TABS
10.0000 mg | ORAL_TABLET | ORAL | Status: DC | PRN
  Administered 2021-04-19: 10 mg via ORAL
  Administered 2021-04-20: 15 mg via ORAL
  Filled 2021-04-19: qty 2

## 2021-04-19 MED ORDER — SUCCINYLCHOLINE CHLORIDE 200 MG/10ML IV SOSY
PREFILLED_SYRINGE | INTRAVENOUS | Status: AC
  Filled 2021-04-19: qty 10

## 2021-04-19 MED ORDER — INSULIN ASPART 100 UNIT/ML IJ SOLN
0.0000 [IU] | Freq: Three times a day (TID) | INTRAMUSCULAR | Status: DC
  Administered 2021-04-19 – 2021-04-20 (×3): 3 [IU] via SUBCUTANEOUS

## 2021-04-19 MED ORDER — OXYCODONE-ACETAMINOPHEN 5-325 MG PO TABS
1.0000 | ORAL_TABLET | ORAL | 0 refills | Status: DC | PRN
  Filled 2021-04-19: qty 30, 7d supply, fill #0

## 2021-04-19 MED ORDER — SODIUM CHLORIDE 0.9% FLUSH
INTRAVENOUS | Status: DC | PRN
  Administered 2021-04-19: 30 mL

## 2021-04-19 MED ORDER — SUGAMMADEX SODIUM 200 MG/2ML IV SOLN
INTRAVENOUS | Status: DC | PRN
  Administered 2021-04-19: 200 mg via INTRAVENOUS

## 2021-04-19 MED ORDER — VITAMIN B-12 1000 MCG PO TABS
1000.0000 ug | ORAL_TABLET | Freq: Every day | ORAL | Status: DC
  Administered 2021-04-19 – 2021-04-20 (×2): 1000 ug via ORAL
  Filled 2021-04-19 (×2): qty 1

## 2021-04-19 MED ORDER — TRANEXAMIC ACID-NACL 1000-0.7 MG/100ML-% IV SOLN
1000.0000 mg | Freq: Once | INTRAVENOUS | Status: AC
  Administered 2021-04-19: 1000 mg via INTRAVENOUS
  Filled 2021-04-19: qty 100

## 2021-04-19 MED ORDER — ALUM & MAG HYDROXIDE-SIMETH 200-200-20 MG/5ML PO SUSP: 30.0000 mL | ORAL | Status: DC | PRN

## 2021-04-19 MED ORDER — VITAMIN D 25 MCG (1000 UNIT) PO TABS
4000.0000 [IU] | ORAL_TABLET | Freq: Every day | ORAL | Status: DC
  Administered 2021-04-19 – 2021-04-20 (×2): 4000 [IU] via ORAL
  Filled 2021-04-19 (×2): qty 4

## 2021-04-19 MED ORDER — 0.9 % SODIUM CHLORIDE (POUR BTL) OPTIME
TOPICAL | Status: DC | PRN
  Administered 2021-04-19: 1000 mL

## 2021-04-19 MED ORDER — BUPIVACAINE LIPOSOME 1.3 % IJ SUSP
INTRAMUSCULAR | Status: DC | PRN
Start: 2021-04-19 — End: 2021-04-19
  Administered 2021-04-19: 10 mL

## 2021-04-19 MED ORDER — MAGNESIUM OXIDE -MG SUPPLEMENT 400 (240 MG) MG PO TABS
400.0000 mg | ORAL_TABLET | Freq: Two times a day (BID) | ORAL | Status: DC
  Administered 2021-04-19 – 2021-04-20 (×2): 400 mg via ORAL
  Filled 2021-04-19 (×2): qty 1

## 2021-04-19 MED ORDER — POLYETHYLENE GLYCOL 3350 17 G PO PACK: 17.0000 g | PACK | Freq: Every day | ORAL | Status: DC | PRN

## 2021-04-19 MED ORDER — STERILE WATER FOR IRRIGATION IR SOLN
Status: DC | PRN
  Administered 2021-04-19: 2000 mL

## 2021-04-19 MED ORDER — ROSUVASTATIN CALCIUM 5 MG PO TABS
5.0000 mg | ORAL_TABLET | ORAL | Status: DC
Start: 2021-04-19 — End: 2021-04-20
  Administered 2021-04-19: 5 mg via ORAL
  Filled 2021-04-19: qty 1

## 2021-04-19 SURGICAL SUPPLY — 64 items
APL PRP STRL LF DISP 70% ISPRP (MISCELLANEOUS) ×1
BAG COUNTER SPONGE SURGICOUNT (BAG) IMPLANT
BAG DECANTER FOR FLEXI CONT (MISCELLANEOUS) ×2 IMPLANT
BAG SPNG CNTER NS LX DISP (BAG)
BLADE SAW SGTL 73X25 THK (BLADE) IMPLANT
BONE CHIP PRESERV 30CC PCAN30 (Bone Implant) ×2 IMPLANT
CHLORAPREP W/TINT 26 (MISCELLANEOUS) ×2 IMPLANT
COVER SURGICAL LIGHT HANDLE (MISCELLANEOUS) ×2 IMPLANT
CUP ACETAB 60MM (Orthopedic Implant) ×1 IMPLANT
DRAPE C-ARM 42X120 X-RAY (DRAPES) IMPLANT
DRAPE C-ARMOR (DRAPES) IMPLANT
DRAPE ORTHO SPLIT 77X108 STRL (DRAPES) ×4
DRAPE SHEET LG 3/4 BI-LAMINATE (DRAPES) ×2 IMPLANT
DRAPE SURG ORHT 6 SPLT 77X108 (DRAPES) ×2 IMPLANT
DRAPE U-SHAPE 47X51 STRL (DRAPES) ×2 IMPLANT
DRSG AQUACEL AG ADV 3.5X10 (GAUZE/BANDAGES/DRESSINGS) ×2 IMPLANT
ELECT BLADE TIP CTD 4 INCH (ELECTRODE) ×2 IMPLANT
ELECT REM PT RETURN 15FT ADLT (MISCELLANEOUS) ×2 IMPLANT
GAUZE SPONGE 4X4 12PLY STRL (GAUZE/BANDAGES/DRESSINGS) IMPLANT
GAUZE XEROFORM 5X9 LF (GAUZE/BANDAGES/DRESSINGS) IMPLANT
GLOVE SRG 8 PF TXTR STRL LF DI (GLOVE) ×1 IMPLANT
GLOVE SURG ENC MOIS LTX SZ7.5 (GLOVE) ×2 IMPLANT
GLOVE SURG ENC MOIS LTX SZ8.5 (GLOVE) ×2 IMPLANT
GLOVE SURG UNDER POLY LF SZ8 (GLOVE) ×2
GLOVE SURG UNDER POLY LF SZ9 (GLOVE) ×2 IMPLANT
GOWN STRL REIN XL XLG (GOWN DISPOSABLE) ×4 IMPLANT
GRAFT BNE CANC CHIPS 1-8 30CC (Bone Implant) IMPLANT
HEAD M SROM 36MM PLUS (Hips) IMPLANT
HOLDER FOLEY CATH W/STRAP (MISCELLANEOUS) ×2 IMPLANT
HOOD PEEL AWAY FLYTE STAYCOOL (MISCELLANEOUS) ×8 IMPLANT
IMMOBILIZER KNEE 20 (SOFTGOODS)
IMMOBILIZER KNEE 20 THIGH 36 (SOFTGOODS) IMPLANT
IV NS IRRIG 3000ML ARTHROMATIC (IV SOLUTION) ×2 IMPLANT
KIT BASIN OR (CUSTOM PROCEDURE TRAY) ×2 IMPLANT
KIT TURNOVER KIT A (KITS) IMPLANT
LINER NEUTRAL 58X36MM PLUS4 ×1 IMPLANT
NDL HYPO 21X1.5 SAFETY (NEEDLE) ×1 IMPLANT
NDL MAYO CATGUT SZ4 TPR NDL (NEEDLE) IMPLANT
NDL SAFETY ECLIPSE 18X1.5 (NEEDLE) ×1 IMPLANT
NEEDLE HYPO 18GX1.5 SHARP (NEEDLE) ×2
NEEDLE HYPO 21X1.5 SAFETY (NEEDLE) ×2 IMPLANT
NEEDLE MAYO CATGUT SZ4 (NEEDLE) IMPLANT
NS IRRIG 1000ML POUR BTL (IV SOLUTION) ×2 IMPLANT
PACK TOTAL JOINT (CUSTOM PROCEDURE TRAY) ×2 IMPLANT
PASSER SUT SWANSON 36MM LOOP (INSTRUMENTS) ×1 IMPLANT
PROTECTOR NERVE ULNAR (MISCELLANEOUS) ×2 IMPLANT
SPIKE FLUID TRANSFER (MISCELLANEOUS) ×4 IMPLANT
SPONGE T-LAP 18X18 ~~LOC~~+RFID (SPONGE) IMPLANT
SROM M HEAD 36MM PLUS (Hips) ×2 IMPLANT
STEM FEM MOD STD 42 13X18X160 (Hips) ×1 IMPLANT
SUT ETHIBOND 2 V 37 (SUTURE) ×6 IMPLANT
SUT VIC AB 0 CT1 36 (SUTURE) ×2 IMPLANT
SUT VIC AB 1 CTX 36 (SUTURE) ×2
SUT VIC AB 1 CTX36XBRD ANBCTR (SUTURE) ×1 IMPLANT
SUT VIC AB 3-0 CT1 27 (SUTURE) ×2
SUT VIC AB 3-0 CT1 TAPERPNT 27 (SUTURE) ×1 IMPLANT
SWAB COLLECTION DEVICE MRSA (MISCELLANEOUS) IMPLANT
SWAB CULTURE ESWAB REG 1ML (MISCELLANEOUS) IMPLANT
SYR CONTROL 10ML LL (SYRINGE) ×4 IMPLANT
TOWEL OR 17X26 10 PK STRL BLUE (TOWEL DISPOSABLE) ×2 IMPLANT
TOWEL OR NON WOVEN STRL DISP B (DISPOSABLE) ×3 IMPLANT
TOWER CARTRIDGE SMART MIX (DISPOSABLE) IMPLANT
TRAY FOLEY MTR SLVR 14FR STAT (SET/KITS/TRAYS/PACK) ×2 IMPLANT
WATER STERILE IRR 1000ML POUR (IV SOLUTION) ×4 IMPLANT

## 2021-04-19 NOTE — Anesthesia Postprocedure Evaluation (Signed)
Anesthesia Post Note  Patient: Rebecca Graves  Procedure(s) Performed: RIGHT TOTAL HIP REVISION (Right: Hip)     Patient location during evaluation: PACU Anesthesia Type: General Level of consciousness: awake Pain management: pain level controlled Respiratory status: spontaneous breathing Cardiovascular status: stable Postop Assessment: no apparent nausea or vomiting Anesthetic complications: no   No notable events documented.  Last Vitals:  Vitals:   04/19/21 1115 04/19/21 1130  BP: 128/65 138/69  Pulse: (!) 52 (!) 54  Resp: 12 12  Temp:    SpO2: 100% 99%    Last Pain:  Vitals:   04/19/21 1130  TempSrc:   PainSc: 3                  Kattia Selley

## 2021-04-19 NOTE — Discharge Instructions (Signed)

## 2021-04-19 NOTE — Transfer of Care (Signed)
Immediate Anesthesia Transfer of Care Note  Patient: Rebecca Graves  Procedure(s) Performed: RIGHT TOTAL HIP REVISION (Right: Hip)  Patient Location: PACU  Anesthesia Type:General  Level of Consciousness: awake, alert , oriented and patient cooperative  Airway & Oxygen Therapy: Patient Spontanous Breathing and Patient connected to face mask oxygen  Post-op Assessment: Report given to RN and Post -op Vital signs reviewed and stable  Post vital signs: Reviewed and stable  Last Vitals:  Vitals Value Taken Time  BP 114/55 04/19/21 1028  Temp 36.5 C 04/19/21 1028  Pulse 42 04/19/21 1029  Resp 10 04/19/21 1029  SpO2 100 % 04/19/21 1029  Vitals shown include unvalidated device data.  Last Pain:  Vitals:   04/19/21 0613  TempSrc:   PainSc: 4          Complications: No notable events documented.

## 2021-04-19 NOTE — Op Note (Signed)
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  No pseudotumor no pseudocyst  Procedure: Revision right total hip arthroplasty with removal of ASR cup, removal of the old S-ROM stem and femoral head.  We revised to a 16 mm DePuy Gryption multihole cup +4 polyethylene liner, new S-ROM 18 x 13 x 1 60 x 42 stem +0 x 36 mm metal head. Surgeon: Kathalene Frames. Mayer Camel M.D.  Assistant: Kerry Hough. Barton Dubois  (present throughout entire procedure and necessary for timely completion of the procedure)  Estimated blood loss: 450 cc  Fluid replacement: 1800 cc of crystalloid  Complications: None  Indications: Patient with an ASR on S-ROM total hip, index procedure done in 2008.  Patient did well a few years ago until she had the onset of some right groin pain.  Radiographs at that time were nondiagnostic.  Pain became worse last year new x-rays showed migration of the acetabular component into protrusio with about 25 to 30% of the cup now in the pelvis.  CT scan was accomplished showing adequate bone stock superiorly anteriorly and posteriorly.  To decrease pain and increase function she desires revision of her right total hip arthroplasty that has come loose.  In addition this is a metal-on-metal bearing system that was subject to recall back in 2010.  No fevers or chills or wound problems.  She actually ambulates with a single cane at age 81.  Procedure: Patient was identified by arm band receive preoperative IV antibiotics in the holding area at, and hospital. He was then taken to the operating room where the appropriate anesthetic monitors were attached and general endotracheal anesthesia induced with the patient in the supine position. He was then rolled into the left lateral decubitus position and fixed there with a mark 2 pelvic clamp. A  Foley catheter was inserted and the limb prepped and draped in usual sterile fashion from the ankle to the hemipelvis. Time out procedure performed. Skin along the lateral hip and thigh infiltrated with 10 cc of 1/2% Marcaine and epinephrine solution. We began the operation by recreating the old posterior lateral incision 20 cm in line through the skin and subcutaneous tissue down to the level of the IT band which was cut in line with the skin incision.  We removed 1/4 inch of scar tissue on either side of the old wound during the initial approach.  The IT band was cut in line with the skin incision.  We then remove scar tissue from around to the ASR cup and femoral stem trunnion, dislocated a total hip and removed the ASR head with a mallet and metal cylinder.  We then remove scar tissue superiorly anteriorly inferiorly and posteriorly.  And also remove scar tissue from around the neck of the prosthesis.  There is trunnion gnosis noted on the [facility] taper.  The S-ROM stem was then removed using a flat screwdriver to break the seal between the stem and the sleeve and the slaphammer to extract the stem.  This enhanced our visibility of the acetabulum we cleaned the rim of bone around to the ASR cup which was recessed a good 7 to 10 mm around because of the protrusio.  We then used the Innomed curved osteotomes of the 47 mm head and a 54 mm radius to extract the old ASR shell.  By palpation there was still a  good bony rim around the acetabulum.  We then began to sequentially ream up to 59 mm with a basket reamers obtaining good rim fit.  We did touch the room with a 60 mm reamer.  Because of the medial defect I then reversed reamed 1 ounce of crushed cancellous bone into the acetabulum.  We then selected a 16 mm DePuy multihole GRIPTION cup which was hammered into place and 45 degrees of abduction and 20 degrees of anteversion.  Good fit and fill was accomplished no supplemental screws were required.  A trial +4 liner  was placed in the acetabulum.  We then placed a trial 18 x 13 S-ROM stem with a 42 neck inside the sleeve placing the neck of the stem and 20 degrees of anteversion and performed a trial reduction with a +0 x 36 mm trial ball.  The hip was completely stable in full extension and external rotation and flexion of 90 degrees stable to 60 degrees of internal rotation.  At this point the trial components were removed.  The wound was thoroughly irrigated with normal saline solution.  The soft tissues were injected with Exparel.  A +4 polyethylene liner was hammered into the acetabulum.  A new 18 x 13 x 1 60 x 42 S-ROM stem was hammered into place in 20 degrees of anteversion in relation to the knee and a +0 x 36 mm metal head hammered onto the stem.  The hip was reduced and stability was once again excellent.  The wound was once again irrigated out with normal saline solution.  Posterior capsule was repaired back to the intertrochanteric crest through drill holes with #2 Ethibond suture.  The IT band was closed with running #1 Vicryl suture.  Subcutaneous tissue deep and superficial with running 3-0 Vicryl suture and subcuticular closure with 3-0 Vicryl suture.  Aquacel dressing was applied.  The patient was unclamped rolled supine awakened and taken to the recovery room without difficulty.

## 2021-04-19 NOTE — Evaluation (Signed)
Physical Therapy Evaluation Patient Details Name: Rebecca Graves MRN: [id number] DOB: [birth year] Today's Date: 04/19/2021  History of Present Illness  s/p 81 yo female s/p R THA revision.  PMH: DM, HTN, obesity  Clinical Impression  Pt is s/p posterior THA resulting in the deficits listed below (see PT Problem List).   Pt amb short distance in room today, limited by "lightheadedness".  Anticipate steady progress, pt plans to d/c to niece's home.  Pt will benefit from skilled PT to increase their independence and safety with mobility to allow discharge to the venue listed below.         Recommendations for follow up therapy are one component of a multi-disciplinary discharge planning process, led by the attending physician.  Recommendations may be updated based on patient status, additional functional criteria and insurance authorization.  Follow Up Recommendations Follow physician's recommendations for discharge plan and follow up therapies    Assistance Recommended at Discharge Intermittent Supervision/Assistance  Patient can return home with the following  A little help with walking and/or transfers;Help with stairs or ramp for entrance;A little help with bathing/dressing/bathroom;Assist for transportation    Equipment Recommendations None recommended by PT  Recommendations for Other Services       Functional Status Assessment Patient has had a recent decline in their functional status and demonstrates the ability to make significant improvements in function in a reasonable and predictable amount of time.     Precautions / Restrictions Precautions Precautions: Posterior Hip;Fall Restrictions Weight Bearing Restrictions: No RLE Weight Bearing: Weight bearing as tolerated      Mobility  Bed Mobility Overal bed mobility: Needs Assistance Bed Mobility: Supine to Sit     Supine to sit: Min assist     General bed mobility comments: assist with RLE, cues for technique and  THP    Transfers Overall transfer level: Needs assistance Equipment used: Rolling walker (2 wheels) Transfers: Sit to/from Stand Sit to Stand: From elevated surface, Min assist, Mod assist, +2 physical assistance           General transfer comment: cues for hand placement and RLE position/THP    Ambulation/Gait Ambulation/Gait assistance: Min assist Gait Distance (Feet): 6 Feet Assistive device: Rolling walker (2 wheels) Gait Pattern/deviations: Step-to pattern       General Gait Details: cues for sequence, distance limited by dizziness/"lightheadedness"  Stairs            Wheelchair Mobility    Modified Rankin (Stroke Patients Only)       Balance                                             Pertinent Vitals/Pain Pain Assessment Pain Assessment: 0-10 Pain Score: 4  Pain Location: right hip Pain Descriptors / Indicators: Grimacing, Sore, Aching Pain Intervention(s): Limited activity within patient's tolerance, Monitored during session, Premedicated before session, Repositioned    Home Living Family/patient expects to be discharged to:: Private residence Living Arrangements: Alone Available Help at Discharge: Family (niece) Type of Home: House Home Access: Stairs to enter   Technical brewer of Steps: 1   Home Layout: One level Home Equipment: Public relations account executive (2 wheels);Cane - single point;Shower seat Additional Comments: info above is for niece's home; pt resides in second floor apt    Prior Function Prior Level of Function : Independent/Modified Independent  Hand Dominance        Extremity/Trunk Assessment   Upper Extremity Assessment Upper Extremity Assessment: Overall WFL for tasks assessed    Lower Extremity Assessment Lower Extremity Assessment: RLE deficits/detail RLE Deficits / Details: ankle WFL, knee and hip grossly 2+/5--limitee by post op pain and weakness        Communication   Communication: No difficulties  Cognition Arousal/Alertness: Awake/alert Behavior During Therapy: WFL for tasks assessed/performed Overall Cognitive Status: Within Functional Limits for tasks assessed                                 General Comments: sleepy but arouses easily, very pleasant        General Comments      Exercises Total Joint Exercises Ankle Circles/Pumps: AROM, Both, 10 reps   Assessment/Plan    PT Assessment Patient needs continued PT services  PT Problem List Decreased strength;Decreased mobility;Decreased activity tolerance;Decreased knowledge of use of DME;Pain;Decreased knowledge of precautions       PT Treatment Interventions DME instruction;Therapeutic exercise;Gait training;Functional mobility training;Therapeutic activities;Patient/family education;Stair training    PT Goals (Current goals can be found in the Care Plan section)  Acute Rehab PT Goals Patient Stated Goal: home when ready PT Goal Formulation: With patient Time For Goal Achievement: 04/26/21 Potential to Achieve Goals: Good    Frequency 7X/week     Co-evaluation               AM-PAC PT "6 Clicks" Mobility  Outcome Measure Help needed turning from your back to your side while in a flat bed without using bedrails?: A Little Help needed moving from lying on your back to sitting on the side of a flat bed without using bedrails?: A Little Help needed moving to and from a bed to a chair (including a wheelchair)?: A Little Help needed standing up from a chair using your arms (e.g., wheelchair or bedside chair)?: A Lot Help needed to walk in hospital room?: A Little Help needed climbing 3-5 steps with a railing? : Total 6 Click Score: 15    End of Session Equipment Utilized During Treatment: Gait belt Activity Tolerance: Patient tolerated treatment well;Other (comment) (ltd by dizziness) Patient left: in chair;with call bell/phone within  reach;with chair alarm set Nurse Communication: Mobility status PT Visit Diagnosis: Other abnormalities of gait and mobility (R26.89);Difficulty in walking, not elsewhere classified (R26.2)    Time: 8022-3361 PT Time Calculation (min) (ACUTE ONLY): 17 min   Charges:   PT Evaluation $PT Eval Low Complexity: [place], PT  Acute Rehab Dept ([facility]) [phone] Pager [phone]  04/19/2021   [facility] 04/19/2021, 4:01 PM

## 2021-04-19 NOTE — Anesthesia Procedure Notes (Signed)
Procedure Name: Intubation Date/Time: 04/19/2021 7:32 AM Performed by: Cleda Daub, CRNA Pre-anesthesia Checklist: Patient identified, Emergency Drugs available, Suction available and Patient being monitored Patient Re-evaluated:Patient Re-evaluated prior to induction Oxygen Delivery Method: Circle system utilized Preoxygenation: Pre-oxygenation with 100% oxygen Induction Type: IV induction Laryngoscope Size: Mac and 3 Grade View: Grade I Tube type: Oral Tube size: 7.0 mm Number of attempts: 1 Airway Equipment and Method: Stylet and Oral airway Placement Confirmation: ETT inserted through vocal cords under direct vision, positive ETCO2 and breath sounds checked- equal and bilateral Secured at: 21 cm Tube secured with: Tape Dental Injury: Teeth and Oropharynx as per pre-operative assessment

## 2021-04-19 NOTE — Interval H&P Note (Signed)
History and Physical Interval Note:  04/19/2021 7:05 AM  Rebecca Graves  has presented today for surgery, with the diagnosis of LOOSE RIGHT HIP REPLACEMENT.  The various methods of treatment have been discussed with the patient and family. After consideration of risks, benefits and other options for treatment, the patient has consented to  Procedure(s): RIGHT TOTAL HIP REVISION (Right) as a surgical intervention.  The patient's history has been reviewed, patient examined, no change in status, stable for surgery.  I have reviewed the patient's chart and labs.  Questions were answered to the patient's satisfaction.     Kerin Salen

## 2021-04-20 ENCOUNTER — Other Ambulatory Visit (HOSPITAL_COMMUNITY): Payer: Self-pay

## 2021-04-20 ENCOUNTER — Encounter (HOSPITAL_COMMUNITY): Payer: Self-pay | Admitting: Orthopedic Surgery

## 2021-04-20 LAB — BASIC METABOLIC PANEL
Anion gap: 8 (ref 5–15)
BUN: 9 mg/dL (ref 8–23)
CO2: 22 mmol/L (ref 22–32)
Calcium: 9.1 mg/dL (ref 8.9–10.3)
Chloride: 106 mmol/L (ref 98–111)
Creatinine, Ser: 0.82 mg/dL (ref 0.44–1.00)
GFR, Estimated: >60 mL/min (ref >60)
Glucose, Bld: 147 mg/dL — ABNORMAL HIGH (ref 70–99)
Potassium: 3.7 mmol/L (ref 3.5–5.1)
Sodium: 136 mmol/L (ref 135–145)

## 2021-04-20 LAB — CBC
HCT: 33.8 % — ABNORMAL LOW (ref 36.0–46.0)
Hemoglobin: 11.5 g/dL — ABNORMAL LOW (ref 12.0–15.0)
MCH: 27.1 pg (ref 26.0–34.0)
MCHC: 34 g/dL (ref 30.0–36.0)
MCV: 79.7 fL — ABNORMAL LOW (ref 80.0–100.0)
Platelets: 134 10*3/uL — ABNORMAL LOW (ref 150–400)
RBC: 4.24 MIL/uL (ref 3.87–5.11)
RDW: 13.7 % (ref 11.5–15.5)
WBC: 5.6 10*3/uL (ref 4.0–10.5)
nRBC: 0 % (ref 0.0–0.2)

## 2021-04-20 LAB — HEMOGLOBIN A1C
Hgb A1c MFr Bld: 6.8 % — ABNORMAL HIGH (ref 4.8–5.6)
Mean Plasma Glucose: 148 mg/dL

## 2021-04-20 LAB — GLUCOSE, CAPILLARY
Glucose-Capillary: 169 mg/dL — ABNORMAL HIGH (ref 70–99)
Glucose-Capillary: 168 mg/dL — ABNORMAL HIGH (ref 70–99)

## 2021-04-20 NOTE — Progress Notes (Signed)
04/20/21 1400  PT Visit Information  Last PT Received On 04/20/21  Pt  progressing well, meeting goals. Ready for d/c from PT standpoint with family assist as needed. Discussed with RN  Assistance Needed +1  History of Present Illness s/p 81 yo female s/p R THA revision.  PMH: DM, HTN, obesity  Subjective Data  Patient Stated Goal home when ready  Precautions  Precautions Posterior Hip;Fall  Restrictions  Weight Bearing Restrictions No  RLE Weight Bearing WBAT  Pain Assessment  Pain Assessment 0-10  Pain Score 4  Pain Location right hip  Pain Descriptors / Indicators Grimacing;Sore;Aching  Pain Intervention(s) Limited activity within patient's tolerance;Monitored during session;Premedicated before session  Cognition  Arousal/Alertness Awake/alert  Behavior During Therapy WFL for tasks assessed/performed  Overall Cognitive Status Within Functional Limits for tasks assessed  Bed Mobility  General bed mobility comments in recliner  Transfers  Overall transfer level Needs assistance  Equipment used Rolling walker (2 wheels)  Transfers Sit to/from Stand  Sit to Stand Min guard;Supervision  General transfer comment self cues for hand placement and RLE position/THP, demonstrates carryover from previous sessions  Ambulation/Gait  Ambulation/Gait assistance Min guard;Supervision  Gait Distance (Feet) 50 Feet  Assistive device Rolling walker (2 wheels)  Gait Pattern/deviations Step-to pattern  General Gait Details self cues for sequence, and safety/THP with turns. supervision for safety, no LOB  Total Joint Exercises  Ankle Circles/Pumps AROM;Both;10 reps  Quad Sets AROM;Both;10 reps  Heel Slides AAROM;Right;10 reps  Hip ABduction/ADduction AROM;AAROM;Right;10 reps  PT - End of Session  Equipment Utilized During Treatment Gait belt  Activity Tolerance Patient tolerated treatment well  Patient left with call bell/phone within reach;Other (comment) (in bathroom, staff notified)   Nurse Communication Mobility status   PT - Assessment/Plan  PT Plan Current plan remains appropriate  PT Visit Diagnosis Other abnormalities of gait and mobility (R26.89);Difficulty in walking, not elsewhere classified (R26.2)  PT Frequency (ACUTE ONLY) 7X/week  Follow Up Recommendations Follow physician's recommendations for discharge plan and follow up therapies  Assistance recommended at discharge Intermittent Supervision/Assistance  Patient can return home with the following A little help with walking and/or transfers;Help with stairs or ramp for entrance;A little help with bathing/dressing/bathroom;Assist for transportation  PT equipment None recommended by PT  AM-PAC PT "6 Clicks" Mobility Outcome Measure (Version 2)  Help needed turning from your back to your side while in a flat bed without using bedrails? 3  Help needed moving from lying on your back to sitting on the side of a flat bed without using bedrails? 3  Help needed moving to and from a bed to a chair (including a wheelchair)? 3  Help needed standing up from a chair using your arms (e.g., wheelchair or bedside chair)? 3  Help needed to walk in hospital room? 3  Help needed climbing 3-5 steps with a railing?  3  6 Click Score 18  Consider Recommendation of Discharge To: Home with [facility]  Progressive Mobility  What is the highest level of mobility based on the progressive mobility assessment? Level 5 (Walks with assist in room/hall) - Balance while stepping forward/back and can walk in room with assist - Complete  Activity Ambulated with assistance to bathroom;Ambulated with assistance in hallway  PT Goal Progression  Progress towards PT goals Progressing toward goals  Acute Rehab PT Goals  PT Goal Formulation With patient  Time For Goal Achievement 04/26/21  Potential to Achieve Goals Good  PT Time Calculation  PT Start Time (ACUTE  ONLY) 1411  PT Stop Time (ACUTE ONLY) 1451  PT Time Calculation (min) (ACUTE ONLY) 40 min   PT General Charges  $$ ACUTE PT VISIT 1 Visit  PT Treatments  $Gait Training 23-37 mins  $Therapeutic Exercise 8-22 mins

## 2021-04-20 NOTE — Progress Notes (Signed)
PATIENT ID: Rebecca Graves  MRN: [id number]  DOB/AGE:  [birth year] / 81 y.o.  1 Day Post-Op Procedure(s) (LRB): RIGHT TOTAL HIP REVISION (Right)    PROGRESS NOTE Subjective: Patient is alert, oriented, no Nausea, no Vomiting, yes passing gas, . Taking PO well. Denies SOB, Chest or Calf Pain. Using Incentive Spirometer, PAS in place. Ambulate WBAT, walked in room Patient reports pain as  2/10  .    Objective: Vital signs in last 24 hours: Vitals:   04/19/21 1800 04/19/21 2141 04/20/21 0159 04/20/21 0524  BP:  135/64 (!) 126/57 126/60  Pulse:  62 (!) 57 65  Resp:  16 16 16   Temp:  98.2 F (36.8 C) 98.5 F (36.9 C) 98.7 F (37.1 C)  TempSrc:  Oral Oral Oral  SpO2:  96% 94% 96%  Weight: 102 kg     Height: 5' 7.72" (1.72 m)         Intake/Output from previous day: I/O last 3 completed shifts: In: 3128.8 [I.V.:2728.8; IV Piggyback:400] Out: 6314 [Urine:4210; Blood:500]   Intake/Output this shift: No intake/output data recorded.   LABORATORY DATA: Recent Labs    04/19/21 1652 04/19/21 2147 04/20/21 0337 04/20/21 0802  WBC  --   --  5.6  --   HGB  --   --  11.5*  --   HCT  --   --  33.8*  --   PLT  --   --  134*  --   NA  --   --  136  --   K  --   --  3.7  --   CL  --   --  106  --   CO2  --   --  22  --   BUN  --   --  9  --   CREATININE  --   --  0.82  --   GLUCOSE  --   --  147*  --   GLUCAP 159* 129*  --  169*  CALCIUM  --   --  9.1  --     Examination: Neurologically intact ABD soft Neurovascular intact Sensation intact distally Intact pulses distally Dorsiflexion/Plantar flexion intact Incision: dressing C/D/I No cellulitis present Compartment soft} XR AP&Lat of hip shows well placed\fixed THA  Assessment:   1 Day Post-Op Procedure(s) (LRB): RIGHT TOTAL HIP REVISION (Right) ADDITIONAL DIAGNOSIS:  Expected Acute Blood Loss Anemia, Hypertension, Hypothyroidism, morbid obesity, Diet-controlled diabetes, stage II chronic renal failure Anticipated LOS  equal to or greater than 2 midnights due to - Age 81 and older with one or more of the following:Patient admitted for inpatient only procedure   Plan: PT/OT WBAT, THA  DVT Prophylaxis: SCDx72 hrs, ASA 81 mg BID x 2 weeks  DISCHARGE PLAN: Home, whwn passes PT  DISCHARGE NEEDS: HHPT, Walker, and 3-in-1 comode seat Patient ID: Rebecca Graves, female   DOB: [birth year], 81 y.o.   MRN: [id number]

## 2021-04-20 NOTE — Discharge Summary (Signed)
Patient ID: Rebecca Graves MRN: [id number] DOB/AGE: 1940-12-23 81 y.o.  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 ([place]) Active Problems:   History of revision of total replacement of right hip joint   Discharge Diagnoses:  Same  Past Medical History:  Diagnosis Date   Anal fissure    Arthritis    Carotid stenosis, <50%, bilateral  12/20/2018   Colon polyps    hyperplastic   Diabetes mellitus without complication (HCC)    diet controlled   Diverticulosis    GERD (gastroesophageal reflux disease)    Glaucoma    Heart murmur    no problems   Hemorrhoids    Hyperlipidemia    Hypertension    Hypothyroidism    Neuromuscular disorder ([place])    neuropathy   Tear of lateral meniscus of knee, current 03/27/2011    Surgeries: Procedure(s): RIGHT TOTAL HIP REVISION on 04/19/2021   Consultants:   Discharged Condition: Improved  Hospital Course: Rebecca Graves is an 81 y.o. female who was admitted 04/19/2021 for operative treatment ofMechanical loosening of internal right hip prosthetic joint ([employer]). Patient has severe unremitting pain that affects sleep, daily activities, and work/hobbies. After pre-op clearance the patient was taken to the operating room on 04/19/2021 and underwent  Procedure(s): RIGHT TOTAL HIP REVISION.    Patient was given perioperative antibiotics:  Anti-infectives (From admission, onward)    Start     Dose/Rate Route Frequency Ordered Stop   04/19/21 0600  ceFAZolin (ANCEF) IVPB 2g/100 mL premix        2 g 200 mL/hr over 30 Minutes Intravenous On call to O.R. 04/19/21 0518 04/19/21 0735        Patient was given sequential compression devices, early ambulation, and chemoprophylaxis to prevent DVT.  Patient benefited maximally from hospital stay and there were no complications.    Recent vital signs: Patient Vitals for the past 24 hrs:   BP Temp Temp src Pulse Resp SpO2 Height Weight  04/20/21 0524 126/60 98.7 F (37.1 C) Oral 65 16 96 % -- --  04/20/21 0159 (!) 126/57 98.5 F (36.9 C) Oral (!) 57 16 94 % -- --  04/19/21 2141 135/64 98.2 F (36.8 C) Oral 62 16 96 % -- --  04/19/21 1800 -- -- -- -- -- -- 5' 7.72" (1.72 m) 102 kg  04/19/21 1757 (!) 150/71 98.3 F (36.8 C) -- 71 16 100 % -- --  04/19/21 1450 (!) 145/62 -- -- (!) 56 16 98 % -- --     Recent laboratory studies:  Recent Labs    04/20/21 0337  WBC 5.6  HGB 11.5*  HCT 33.8*  PLT 134*  NA 136  K 3.7  CL 106  CO2 22  BUN 9  CREATININE 0.82  GLUCOSE 147*  CALCIUM 9.1     Discharge Medications:   Allergies as of 04/20/2021       Reactions   Meloxicam Swelling   Weight gain        Medication List     STOP taking these medications    aspirin 81 MG tablet Replaced by: aspirin EC 81 MG tablet       TAKE these medications    acetaminophen 500 MG tablet Commonly known as: TYLENOL Take 1,000 mg by mouth every 6 (six) hours as needed for moderate pain.   aspirin EC 81 MG tablet Take 1 tablet (81 mg  total) by mouth 2 (two) times daily. Replaces: aspirin 81 MG tablet   B-D SINGLE USE SWABS REGULAR Pads Use 1 swab prior to checking blood sugar daily-DX-E11.9   bisoprolol-hydrochlorothiazide 10-6.25 MG tablet Commonly known as: ZIAC TAKE 1 TABLET BY MOUTH  DAILY FOR BLOOD PRESSURE   docusate sodium 100 MG capsule Commonly known as: COLACE Take 100 mg by mouth 2 (two) times daily.   furosemide 40 MG tablet Commonly known as: LASIX Take 1 tablet  Daily  for BP & Fluid Retention /Ankle Swelling   gabapentin 600 MG tablet Commonly known as: NEURONTIN TAKE 1/2 TO 1 TABLET BY  MOUTH 2 TO 3 TIMES DAILY AS NEEDED FOR PAIN   glucose blood test strip Check blood sugar 1 time a day-DX-E11.9.   HAIR SKIN & NAILS GUMMIES PO Take 1 tablet by mouth in the morning and at bedtime.   Lancets 28G Misc Check blood sugar 1 time a  day-DX-E11.9   latanoprost 0.005 % ophthalmic solution Commonly known as: XALATAN Place 1 drop into both eyes at bedtime.   levothyroxine 100 MCG tablet Commonly known as: SYNTHROID TAKE 1TAB DAILY ON AN EMPTY STOMACH WITH ONLY WATER FOR 30 MINS, &amp; NO ANTACID MEDS, CALCIUM OR MAGNESIUM FOR 4  HOURS, &amp; AVOID BIOTI   magnesium oxide 400 MG tablet Commonly known as: MAG-OX Take 400 mg by mouth 2 (two) times daily.   omeprazole 20 MG capsule Commonly known as: PRILOSEC Take 1 Capsule  2 x/day (every 12  hours) to  Prevent  Heartburn  & Indigestion / Patient knows to take by mouth  ! What changed:  how much to take how to take this when to take this additional instructions   OPTASE EX Place 1 application into both eyes daily.   oxyCODONE-acetaminophen 5-325 MG tablet Commonly known as: PERCOCET/ROXICET Take 1 tablet by mouth every 4 (four) hours as needed for severe pain.   phentermine 37.5 MG tablet Commonly known as: ADIPEX-P Take 1/2 to 1 tablet every Morning for Dieting & Weight Loss   rosuvastatin 5 MG tablet Commonly known as: CRESTOR TAKE 1 TABLET BY MOUTH 3  TIMES PER WEEK ON MONDAY,  WEDNESDAY, AND FRIDAY FOR  CHOLESTEROL   tiZANidine 2 MG tablet Commonly known as: ZANAFLEX Take 1 tablet (2 mg total) by mouth every 6 (six) hours as needed.   topiramate 50 MG tablet Commonly known as: Topamax Take 1/2 to 1 tablet 2 x /day at Suppertime & Bedtime for Dieting & Weight Loss   True Metrix Level 1 Low Soln Check blood sugar 1 time daily-DX-E11.9.   True Metrix Meter w/Device Kit Check blood sugar 1 time daily-DX-E11.9   vitamin B-12 1000 MCG tablet Commonly known as: CYANOCOBALAMIN Take 1,000 mcg by mouth daily.   Vitamin D 50 MCG (2000 UT) tablet Take 4,000 Units by mouth daily.               Durable Medical Equipment  (From admission, onward)           Start     Ordered   04/19/21 1236  DME Walker rolling  Once       Question:   Patient needs a walker to treat with the following condition  Answer:  Status post right hip replacement   04/19/21 1236   04/19/21 1236  DME 3 n 1  Once        04/19/21 1236  Discharge Care Instructions  (From admission, onward)           Start     Ordered   04/20/21 0000  Weight bearing as tolerated        04/20/21 1352            Diagnostic Studies: CT HIP RIGHT WO CONTRAST  Result Date: 04/01/2021 CLINICAL DATA:  Hardware from prior right hip replacement is coming loose. Pain with movement. EXAM: CT OF THE RIGHT HIP WITHOUT CONTRAST TECHNIQUE: Multidetector CT imaging of the right hip was performed according to the standard protocol. Multiplanar CT image reconstructions were also generated. RADIATION DOSE REDUCTION: This exam was performed according to the departmental dose-optimization program which includes automated exposure control, adjustment of the mA and/or kV according to patient size and/or use of iterative reconstruction technique. COMPARISON:  MR hip 09/07/2009; X-ray hip 04/24/2006. FINDINGS: Bones/Joint/Cartilage There is streak artifact from total right hip arthroplasty hardware. Within this limitation, no perihardware lucency is seen to indicate CT evidence of hardware loosening. There is high-grade thinning of the medial acetabular cortex, greatest at the superomedial aspect (coronal series 6, image 62) with an approximate 9 mm transverse region (coronal image 62) of complete erosion of the superomedial acetabular cortex. Moderate degenerative changes of the pubic symphysis including joint space narrowing, subchondral sclerosis, and subchondral degenerative cystic changes. The left femoroacetabular joint is only partially visualized and there appears to be moderate joint space narrowing. There is mild left femoral head-neck junction degenerative osteophytosis. There is also serpiginous centrally lucent and peripherally sclerotic density within the  anterior greater than posterior aspect of the superior left femoral head consistent with the avascular necrosis seen in this region on prior remote 09/07/2009 MRI. The distribution of the avascular necrosis appears grossly similar to prior, and there is again no overlying cortical collapse. Mild bilateral sacroiliac joint subchondral sclerosis and vacuum phenomenon degenerative change. Severe right L5-S1 disc space narrowing with vacuum phenomenon. Ligaments Suboptimally assessed by CT. Muscles and Tendons There is again a homogeneous fat intensity lipoma within the right adductor magnus muscle measuring up to approximately 3.3 x 3.6 by 13.0 cm similar to prior remote MRI. Soft tissues Moderate calcifications are seen within the visualized aorta and iliac arteries. IMPRESSION:: IMPRESSION: 1. Total right hip arthroplasty with high-grade attenuation/erosion of the medial aspect of the acetabulum. There is complete bone loss at the superomedial aspect of the right acetabulum in the region measuring up to 9 mm in transverse dimension. 2. Otherwise, no perihardware lucency is seen to indicate hardware failure of the right hip arthroplasty. 3. Moderate pubic symphysis osteoarthritis. 4. Superior left femoral head serpiginous density again suggesting avascular necrosis. No significant progression is seen compared to 09/07/2009 MRI. No overlying cortical collapse. Electronically Signed   By: Yvonne Kendall M.D.   On: 04/01/2021 16:56   DG Hip Port Unilat With Pelvis 1V Right  Result Date: 04/19/2021 CLINICAL DATA:  History of revision of total replacement of the right hip joint. EXAM: DG HIP (WITH OR WITHOUT PELVIS) 1V PORT RIGHT COMPARISON:  CT examination dated March 31, 2021 FINDINGS: Status post right hip arthroplasty. There is medial migration of the acetabular component. Lucency about the superior aspect of the acetabulum suggestive of loosening. No acute fracture. Femoral component appears intact. IMPRESSION:  Loosening about the superior aspect of the acetabular component with medial migration concerning for loosening. Clinical correlation is suggested. Electronically Signed   By: Keane Police D.O.   On: 04/19/2021 11:35  Disposition: Discharge disposition: 01-Home or Self Care       Discharge Instructions     Call MD / Call 911   Complete by: As directed    If you experience chest pain or shortness of breath, CALL 911 and be transported to the hospital emergency room.  If you develope a fever above 101 F, pus (white drainage) or increased drainage or redness at the wound, or calf pain, call your surgeon's office.   Constipation Prevention   Complete by: As directed    Drink plenty of fluids.  Prune juice may be helpful.  You may use a stool softener, such as Colace (over the counter) 100 mg twice a day.  Use MiraLax (over the counter) for constipation as needed.   Diet - low sodium heart healthy   Complete by: As directed    Driving restrictions   Complete by: As directed    No driving for 2 weeks   Increase activity slowly as tolerated   Complete by: As directed    Patient may shower   Complete by: As directed    You may shower without a dressing once there is no drainage.  Do not wash over the wound.  If drainage remains, cover wound with plastic wrap and then shower.   Post-operative opioid taper instructions:   Complete by: As directed    POST-OPERATIVE OPIOID TAPER INSTRUCTIONS: It is important to wean off of your opioid medication as soon as possible. If you do not need pain medication after your surgery it is ok to stop day one. Opioids include: Codeine, Hydrocodone(Norco, Vicodin), Oxycodone(Percocet, oxycontin) and hydromorphone amongst others.  Long term and even short term use of opiods can cause: Increased pain response Dependence Constipation Depression Respiratory depression And more.  Withdrawal symptoms can include Flu like symptoms Nausea, vomiting And  more Techniques to manage these symptoms Hydrate well Eat regular healthy meals Stay active Use relaxation techniques(deep breathing, meditating, yoga) Do Not substitute Alcohol to help with tapering If you have been on opioids for less than two weeks and do not have pain than it is ok to stop all together.  Plan to wean off of opioids This plan should start within one week post op of your joint replacement. Maintain the same interval or time between taking each dose and first decrease the dose.  Cut the total daily intake of opioids by one tablet each day Next start to increase the time between doses. The last dose that should be eliminated is the evening dose.      Weight bearing as tolerated   Complete by: As directed         Follow-up Information     Frederik Pear, MD Follow up in 2 week(s).   Specialty: Orthopedic Surgery Contact information: [place] [address] [phone]         Care, [facility] Follow up.   Specialty: Home Health Services Why: Alvis Lemmings will provide PT in the home. Contact information: [place] [address] [phone]                  Signed: Joanell Rising 04/20/2021, 1:52 PM

## 2021-04-20 NOTE — Progress Notes (Signed)
Green MD was notified of patient's bradycardia, Robinul 0.2 mg was ordered by Nyoka Cowden MD and was administered by Nyoka Cowden MD. Will continue to monitor the patient.

## 2021-04-20 NOTE — TOC Transition Note (Signed)
Transition of Care [facility]) - CM/SW Discharge Note  Patient Details  Name: DALEYZA GADOMSKI MRN: [id number] Date of Birth: May 10, 1940  [facility]) CM/SW Contact:  Sherie Don, LCSW Phone Number: 04/20/2021, 11:42 AM  Clinical Narrative: Patient is expected to discharge home after working with PT. CSW met with patient to confirm discharge plan and needs. Patient has a rolling walker and BSC at home, so there are no DME needs. Patient was initially referred to [facility] by orthopedist's office, but [facility] declined the referral. CSW referred patient to the following agencies:  [facility]: declined [facility]: declined [facility]: accepted with a delay SOC date  RN updated. TOC signing off.  Final next level of care: [place] Barriers to Discharge: No Barriers Identified  Patient Goals and CMS Choice Patient states their goals for this hospitalization and ongoing recovery are:: Discharge home with [place] CMS Medicare.gov Compare Post Acute Care list provided to:: Patient Choice offered to / list presented to : Patient  Discharge Plan and Services        DME Arranged: N/A DME Agency: NA HH Arranged: PT HH Agency: [place] Date [place]: 04/20/21 Representative spoke with at [place]: Cindie  Readmission Risk Interventions No flowsheet data found.

## 2021-04-20 NOTE — Progress Notes (Signed)
Physical Therapy Treatment Patient Details Name: Rebecca Graves MRN: [id number] DOB: 04/26/1940 Today's Date: 04/20/2021   History of Present Illness s/p 81 yo female s/p R THA revision.  PMH: DM, HTN, obesity    PT Comments    Pt progressing well, she is hopeful to d/c later today if meeting goals   Recommendations for follow up therapy are one component of a multi-disciplinary discharge planning process, led by the attending physician.  Recommendations may be updated based on patient status, additional functional criteria and insurance authorization.  Follow Up Recommendations  Follow physician's recommendations for discharge plan and follow up therapies     Assistance Recommended at Discharge Intermittent Supervision/Assistance  Patient can return home with the following A little help with walking and/or transfers;Help with stairs or ramp for entrance;A little help with bathing/dressing/bathroom;Assist for transportation   Equipment Recommendations  None recommended by PT    Recommendations for Other Services       Precautions / Restrictions Precautions Precautions: Posterior Hip;Fall Restrictions Weight Bearing Restrictions: No RLE Weight Bearing: Weight bearing as tolerated     Mobility  Bed Mobility Overal bed mobility: Needs Assistance Bed Mobility: Sit to Supine       Sit to supine: Min assist   General bed mobility comments: assist with RLE, cues for technique and THP    Transfers Overall transfer level: Needs assistance Equipment used: Rolling walker (2 wheels) Transfers: Sit to/from Stand Sit to Stand: Min assist           General transfer comment: cues for hand placement and RLE position/THP    Ambulation/Gait Ambulation/Gait assistance: Min guard Gait Distance (Feet): 45 Feet (12' more) Assistive device: Rolling walker (2 wheels) Gait Pattern/deviations: Step-to pattern       General Gait Details: cues for sequence, and safety/THP with  turns   Marine scientist Rankin (Stroke Patients Only)       Balance                                            Cognition Arousal/Alertness: Awake/alert Behavior During Therapy: WFL for tasks assessed/performed Overall Cognitive Status: Within Functional Limits for tasks assessed                                          Exercises Total Joint Exercises Ankle Circles/Pumps: AROM, Both, 10 reps    General Comments        Pertinent Vitals/Pain Pain Assessment Pain Assessment: 0-10 Pain Score: 4  Pain Location: right hip Pain Descriptors / Indicators: Grimacing, Sore, Aching Pain Intervention(s): Limited activity within patient's tolerance, Monitored during session, Premedicated before session, Repositioned    Home Living                          Prior Function            PT Goals (current goals can now be found in the care plan section) Acute Rehab PT Goals Patient Stated Goal: home when ready PT Goal Formulation: With patient Time For Goal Achievement: 04/26/21 Potential to Achieve Goals: Good Progress towards PT goals: Progressing toward goals    Frequency  7X/week      PT Plan Current plan remains appropriate    Co-evaluation              AM-PAC PT "6 Clicks" Mobility   Outcome Measure  Help needed turning from your back to your side while in a flat bed without using bedrails?: A Little Help needed moving from lying on your back to sitting on the side of a flat bed without using bedrails?: A Little Help needed moving to and from a bed to a chair (including a wheelchair)?: A Little Help needed standing up from a chair using your arms (e.g., wheelchair or bedside chair)?: A Little Help needed to walk in hospital room?: A Little Help needed climbing 3-5 steps with a railing? : A Little 6 Click Score: 18    End of Session Equipment Utilized During  Treatment: Gait belt Activity Tolerance: Patient tolerated treatment well Patient left: in bed;with call bell/phone within reach;with bed alarm set Nurse Communication: Mobility status PT Visit Diagnosis: Other abnormalities of gait and mobility (R26.89);Difficulty in walking, not elsewhere classified (R26.2)     Time: 1020-1059 PT Time Calculation (min) (ACUTE ONLY): 39 min  Charges:  $Gait Training: 23-37 mins $Therapeutic Activity: 8-22 mins                     Baxter Flattery, PT  Acute Rehab Dept ([place]) [phone] Pager [phone]  04/20/2021    [facility] 04/20/2021, 11:26 AM

## 2021-04-23 DIAGNOSIS — N3281 Overactive bladder: Secondary | ICD-10-CM | POA: Diagnosis not present

## 2021-04-23 DIAGNOSIS — E1122 Type 2 diabetes mellitus with diabetic chronic kidney disease: Secondary | ICD-10-CM | POA: Diagnosis not present

## 2021-04-23 DIAGNOSIS — T84030D Mechanical loosening of internal right hip prosthetic joint, subsequent encounter: Secondary | ICD-10-CM | POA: Diagnosis not present

## 2021-04-23 DIAGNOSIS — G4700 Insomnia, unspecified: Secondary | ICD-10-CM | POA: Diagnosis not present

## 2021-04-23 DIAGNOSIS — Z86010 Personal history of colonic polyps: Secondary | ICD-10-CM | POA: Diagnosis not present

## 2021-04-23 DIAGNOSIS — M461 Sacroiliitis, not elsewhere classified: Secondary | ICD-10-CM | POA: Diagnosis not present

## 2021-04-23 DIAGNOSIS — E559 Vitamin D deficiency, unspecified: Secondary | ICD-10-CM | POA: Diagnosis not present

## 2021-04-23 DIAGNOSIS — K219 Gastro-esophageal reflux disease without esophagitis: Secondary | ICD-10-CM | POA: Diagnosis not present

## 2021-04-23 DIAGNOSIS — E039 Hypothyroidism, unspecified: Secondary | ICD-10-CM | POA: Diagnosis not present

## 2021-04-23 DIAGNOSIS — I129 Hypertensive chronic kidney disease with stage 1 through stage 4 chronic kidney disease, or unspecified chronic kidney disease: Secondary | ICD-10-CM | POA: Diagnosis not present

## 2021-04-23 DIAGNOSIS — M4807 Spinal stenosis, lumbosacral region: Secondary | ICD-10-CM | POA: Diagnosis not present

## 2021-04-23 DIAGNOSIS — I700 Atherosclerosis of aorta: Secondary | ICD-10-CM | POA: Diagnosis not present

## 2021-04-23 DIAGNOSIS — Z7982 Long term (current) use of aspirin: Secondary | ICD-10-CM | POA: Diagnosis not present

## 2021-04-23 DIAGNOSIS — Z9181 History of falling: Secondary | ICD-10-CM | POA: Diagnosis not present

## 2021-04-23 DIAGNOSIS — Z96651 Presence of right artificial knee joint: Secondary | ICD-10-CM | POA: Diagnosis not present

## 2021-04-23 DIAGNOSIS — T84030A Mechanical loosening of internal right hip prosthetic joint, initial encounter: Secondary | ICD-10-CM | POA: Diagnosis not present

## 2021-04-23 DIAGNOSIS — E785 Hyperlipidemia, unspecified: Secondary | ICD-10-CM | POA: Diagnosis not present

## 2021-04-23 DIAGNOSIS — Z87891 Personal history of nicotine dependence: Secondary | ICD-10-CM | POA: Diagnosis not present

## 2021-04-23 DIAGNOSIS — E1169 Type 2 diabetes mellitus with other specified complication: Secondary | ICD-10-CM | POA: Diagnosis not present

## 2021-04-23 DIAGNOSIS — E1142 Type 2 diabetes mellitus with diabetic polyneuropathy: Secondary | ICD-10-CM | POA: Diagnosis not present

## 2021-04-23 DIAGNOSIS — N182 Chronic kidney disease, stage 2 (mild): Secondary | ICD-10-CM | POA: Diagnosis not present

## 2021-04-23 DIAGNOSIS — H409 Unspecified glaucoma: Secondary | ICD-10-CM | POA: Diagnosis not present

## 2021-04-23 DIAGNOSIS — K5790 Diverticulosis of intestine, part unspecified, without perforation or abscess without bleeding: Secondary | ICD-10-CM | POA: Diagnosis not present

## 2021-04-26 DIAGNOSIS — E559 Vitamin D deficiency, unspecified: Secondary | ICD-10-CM | POA: Diagnosis not present

## 2021-04-26 DIAGNOSIS — E785 Hyperlipidemia, unspecified: Secondary | ICD-10-CM | POA: Diagnosis not present

## 2021-04-26 DIAGNOSIS — K219 Gastro-esophageal reflux disease without esophagitis: Secondary | ICD-10-CM | POA: Diagnosis not present

## 2021-04-26 DIAGNOSIS — T84030D Mechanical loosening of internal right hip prosthetic joint, subsequent encounter: Secondary | ICD-10-CM | POA: Diagnosis not present

## 2021-04-26 DIAGNOSIS — M4807 Spinal stenosis, lumbosacral region: Secondary | ICD-10-CM | POA: Diagnosis not present

## 2021-04-26 DIAGNOSIS — I700 Atherosclerosis of aorta: Secondary | ICD-10-CM | POA: Diagnosis not present

## 2021-04-26 DIAGNOSIS — Z9181 History of falling: Secondary | ICD-10-CM | POA: Diagnosis not present

## 2021-04-26 DIAGNOSIS — E1169 Type 2 diabetes mellitus with other specified complication: Secondary | ICD-10-CM | POA: Diagnosis not present

## 2021-04-26 DIAGNOSIS — Z86010 Personal history of colonic polyps: Secondary | ICD-10-CM | POA: Diagnosis not present

## 2021-04-26 DIAGNOSIS — M461 Sacroiliitis, not elsewhere classified: Secondary | ICD-10-CM | POA: Diagnosis not present

## 2021-04-26 DIAGNOSIS — E1142 Type 2 diabetes mellitus with diabetic polyneuropathy: Secondary | ICD-10-CM | POA: Diagnosis not present

## 2021-04-26 DIAGNOSIS — Z96651 Presence of right artificial knee joint: Secondary | ICD-10-CM | POA: Diagnosis not present

## 2021-04-26 DIAGNOSIS — I129 Hypertensive chronic kidney disease with stage 1 through stage 4 chronic kidney disease, or unspecified chronic kidney disease: Secondary | ICD-10-CM | POA: Diagnosis not present

## 2021-04-26 DIAGNOSIS — E1122 Type 2 diabetes mellitus with diabetic chronic kidney disease: Secondary | ICD-10-CM | POA: Diagnosis not present

## 2021-04-26 DIAGNOSIS — Z87891 Personal history of nicotine dependence: Secondary | ICD-10-CM | POA: Diagnosis not present

## 2021-04-26 DIAGNOSIS — N182 Chronic kidney disease, stage 2 (mild): Secondary | ICD-10-CM | POA: Diagnosis not present

## 2021-04-26 DIAGNOSIS — G4700 Insomnia, unspecified: Secondary | ICD-10-CM | POA: Diagnosis not present

## 2021-04-26 DIAGNOSIS — K5790 Diverticulosis of intestine, part unspecified, without perforation or abscess without bleeding: Secondary | ICD-10-CM | POA: Diagnosis not present

## 2021-04-26 DIAGNOSIS — E039 Hypothyroidism, unspecified: Secondary | ICD-10-CM | POA: Diagnosis not present

## 2021-04-26 DIAGNOSIS — H409 Unspecified glaucoma: Secondary | ICD-10-CM | POA: Diagnosis not present

## 2021-04-26 DIAGNOSIS — Z7982 Long term (current) use of aspirin: Secondary | ICD-10-CM | POA: Diagnosis not present

## 2021-04-26 DIAGNOSIS — N3281 Overactive bladder: Secondary | ICD-10-CM | POA: Diagnosis not present

## 2021-04-28 DIAGNOSIS — E785 Hyperlipidemia, unspecified: Secondary | ICD-10-CM | POA: Diagnosis not present

## 2021-04-28 DIAGNOSIS — E039 Hypothyroidism, unspecified: Secondary | ICD-10-CM | POA: Diagnosis not present

## 2021-04-28 DIAGNOSIS — Z9181 History of falling: Secondary | ICD-10-CM | POA: Diagnosis not present

## 2021-04-28 DIAGNOSIS — Z86010 Personal history of colonic polyps: Secondary | ICD-10-CM | POA: Diagnosis not present

## 2021-04-28 DIAGNOSIS — E1142 Type 2 diabetes mellitus with diabetic polyneuropathy: Secondary | ICD-10-CM | POA: Diagnosis not present

## 2021-04-28 DIAGNOSIS — I700 Atherosclerosis of aorta: Secondary | ICD-10-CM | POA: Diagnosis not present

## 2021-04-28 DIAGNOSIS — Z96651 Presence of right artificial knee joint: Secondary | ICD-10-CM | POA: Diagnosis not present

## 2021-04-28 DIAGNOSIS — H409 Unspecified glaucoma: Secondary | ICD-10-CM | POA: Diagnosis not present

## 2021-04-28 DIAGNOSIS — K219 Gastro-esophageal reflux disease without esophagitis: Secondary | ICD-10-CM | POA: Diagnosis not present

## 2021-04-28 DIAGNOSIS — Z7982 Long term (current) use of aspirin: Secondary | ICD-10-CM | POA: Diagnosis not present

## 2021-04-28 DIAGNOSIS — I129 Hypertensive chronic kidney disease with stage 1 through stage 4 chronic kidney disease, or unspecified chronic kidney disease: Secondary | ICD-10-CM | POA: Diagnosis not present

## 2021-04-28 DIAGNOSIS — G4700 Insomnia, unspecified: Secondary | ICD-10-CM | POA: Diagnosis not present

## 2021-04-28 DIAGNOSIS — M461 Sacroiliitis, not elsewhere classified: Secondary | ICD-10-CM | POA: Diagnosis not present

## 2021-04-28 DIAGNOSIS — T84030D Mechanical loosening of internal right hip prosthetic joint, subsequent encounter: Secondary | ICD-10-CM | POA: Diagnosis not present

## 2021-04-28 DIAGNOSIS — N182 Chronic kidney disease, stage 2 (mild): Secondary | ICD-10-CM | POA: Diagnosis not present

## 2021-04-28 DIAGNOSIS — Z87891 Personal history of nicotine dependence: Secondary | ICD-10-CM | POA: Diagnosis not present

## 2021-04-28 DIAGNOSIS — E1169 Type 2 diabetes mellitus with other specified complication: Secondary | ICD-10-CM | POA: Diagnosis not present

## 2021-04-28 DIAGNOSIS — K5790 Diverticulosis of intestine, part unspecified, without perforation or abscess without bleeding: Secondary | ICD-10-CM | POA: Diagnosis not present

## 2021-04-28 DIAGNOSIS — E559 Vitamin D deficiency, unspecified: Secondary | ICD-10-CM | POA: Diagnosis not present

## 2021-04-28 DIAGNOSIS — E1122 Type 2 diabetes mellitus with diabetic chronic kidney disease: Secondary | ICD-10-CM | POA: Diagnosis not present

## 2021-04-28 DIAGNOSIS — N3281 Overactive bladder: Secondary | ICD-10-CM | POA: Diagnosis not present

## 2021-04-28 DIAGNOSIS — M4807 Spinal stenosis, lumbosacral region: Secondary | ICD-10-CM | POA: Diagnosis not present

## 2021-04-29 ENCOUNTER — Ambulatory Visit (HOSPITAL_COMMUNITY)
Admission: RE | Admit: 2021-04-29 | Discharge: 2021-04-29 | Disposition: A | Discharge status: Home / Self Care | Payer: Medicare Other | Source: Ambulatory Visit | Attending: Orthopedic Surgery | Admitting: Orthopedic Surgery

## 2021-04-29 ENCOUNTER — Other Ambulatory Visit: Payer: Self-pay

## 2021-04-29 ENCOUNTER — Other Ambulatory Visit (HOSPITAL_COMMUNITY): Payer: Self-pay | Admitting: Orthopedic Surgery

## 2021-04-29 DIAGNOSIS — M79604 Pain in right leg: Secondary | ICD-10-CM | POA: Diagnosis not present

## 2021-04-29 DIAGNOSIS — Z9889 Other specified postprocedural states: Secondary | ICD-10-CM | POA: Diagnosis not present

## 2021-04-29 DIAGNOSIS — M25551 Pain in right hip: Secondary | ICD-10-CM | POA: Diagnosis not present

## 2021-04-30 DIAGNOSIS — H409 Unspecified glaucoma: Secondary | ICD-10-CM | POA: Diagnosis not present

## 2021-04-30 DIAGNOSIS — Z86010 Personal history of colonic polyps: Secondary | ICD-10-CM | POA: Diagnosis not present

## 2021-04-30 DIAGNOSIS — E1122 Type 2 diabetes mellitus with diabetic chronic kidney disease: Secondary | ICD-10-CM | POA: Diagnosis not present

## 2021-04-30 DIAGNOSIS — E039 Hypothyroidism, unspecified: Secondary | ICD-10-CM | POA: Diagnosis not present

## 2021-04-30 DIAGNOSIS — E1169 Type 2 diabetes mellitus with other specified complication: Secondary | ICD-10-CM | POA: Diagnosis not present

## 2021-04-30 DIAGNOSIS — N182 Chronic kidney disease, stage 2 (mild): Secondary | ICD-10-CM | POA: Diagnosis not present

## 2021-04-30 DIAGNOSIS — Z9181 History of falling: Secondary | ICD-10-CM | POA: Diagnosis not present

## 2021-04-30 DIAGNOSIS — M4807 Spinal stenosis, lumbosacral region: Secondary | ICD-10-CM | POA: Diagnosis not present

## 2021-04-30 DIAGNOSIS — E559 Vitamin D deficiency, unspecified: Secondary | ICD-10-CM | POA: Diagnosis not present

## 2021-04-30 DIAGNOSIS — E1142 Type 2 diabetes mellitus with diabetic polyneuropathy: Secondary | ICD-10-CM | POA: Diagnosis not present

## 2021-04-30 DIAGNOSIS — K219 Gastro-esophageal reflux disease without esophagitis: Secondary | ICD-10-CM | POA: Diagnosis not present

## 2021-04-30 DIAGNOSIS — T84030D Mechanical loosening of internal right hip prosthetic joint, subsequent encounter: Secondary | ICD-10-CM | POA: Diagnosis not present

## 2021-04-30 DIAGNOSIS — I129 Hypertensive chronic kidney disease with stage 1 through stage 4 chronic kidney disease, or unspecified chronic kidney disease: Secondary | ICD-10-CM | POA: Diagnosis not present

## 2021-04-30 DIAGNOSIS — G4700 Insomnia, unspecified: Secondary | ICD-10-CM | POA: Diagnosis not present

## 2021-04-30 DIAGNOSIS — Z96651 Presence of right artificial knee joint: Secondary | ICD-10-CM | POA: Diagnosis not present

## 2021-04-30 DIAGNOSIS — E785 Hyperlipidemia, unspecified: Secondary | ICD-10-CM | POA: Diagnosis not present

## 2021-04-30 DIAGNOSIS — K5790 Diverticulosis of intestine, part unspecified, without perforation or abscess without bleeding: Secondary | ICD-10-CM | POA: Diagnosis not present

## 2021-04-30 DIAGNOSIS — Z87891 Personal history of nicotine dependence: Secondary | ICD-10-CM | POA: Diagnosis not present

## 2021-04-30 DIAGNOSIS — I700 Atherosclerosis of aorta: Secondary | ICD-10-CM | POA: Diagnosis not present

## 2021-04-30 DIAGNOSIS — Z7982 Long term (current) use of aspirin: Secondary | ICD-10-CM | POA: Diagnosis not present

## 2021-04-30 DIAGNOSIS — M461 Sacroiliitis, not elsewhere classified: Secondary | ICD-10-CM | POA: Diagnosis not present

## 2021-04-30 DIAGNOSIS — N3281 Overactive bladder: Secondary | ICD-10-CM | POA: Diagnosis not present

## 2021-05-03 DIAGNOSIS — K5790 Diverticulosis of intestine, part unspecified, without perforation or abscess without bleeding: Secondary | ICD-10-CM | POA: Diagnosis not present

## 2021-05-03 DIAGNOSIS — I129 Hypertensive chronic kidney disease with stage 1 through stage 4 chronic kidney disease, or unspecified chronic kidney disease: Secondary | ICD-10-CM | POA: Diagnosis not present

## 2021-05-03 DIAGNOSIS — N182 Chronic kidney disease, stage 2 (mild): Secondary | ICD-10-CM | POA: Diagnosis not present

## 2021-05-03 DIAGNOSIS — E1122 Type 2 diabetes mellitus with diabetic chronic kidney disease: Secondary | ICD-10-CM | POA: Diagnosis not present

## 2021-05-03 DIAGNOSIS — Z86010 Personal history of colonic polyps: Secondary | ICD-10-CM | POA: Diagnosis not present

## 2021-05-03 DIAGNOSIS — E1142 Type 2 diabetes mellitus with diabetic polyneuropathy: Secondary | ICD-10-CM | POA: Diagnosis not present

## 2021-05-03 DIAGNOSIS — G4700 Insomnia, unspecified: Secondary | ICD-10-CM | POA: Diagnosis not present

## 2021-05-03 DIAGNOSIS — M461 Sacroiliitis, not elsewhere classified: Secondary | ICD-10-CM | POA: Diagnosis not present

## 2021-05-03 DIAGNOSIS — M4807 Spinal stenosis, lumbosacral region: Secondary | ICD-10-CM | POA: Diagnosis not present

## 2021-05-03 DIAGNOSIS — T84030D Mechanical loosening of internal right hip prosthetic joint, subsequent encounter: Secondary | ICD-10-CM | POA: Diagnosis not present

## 2021-05-03 DIAGNOSIS — I700 Atherosclerosis of aorta: Secondary | ICD-10-CM | POA: Diagnosis not present

## 2021-05-03 DIAGNOSIS — N3281 Overactive bladder: Secondary | ICD-10-CM | POA: Diagnosis not present

## 2021-05-03 DIAGNOSIS — K219 Gastro-esophageal reflux disease without esophagitis: Secondary | ICD-10-CM | POA: Diagnosis not present

## 2021-05-03 DIAGNOSIS — H409 Unspecified glaucoma: Secondary | ICD-10-CM | POA: Diagnosis not present

## 2021-05-03 DIAGNOSIS — E785 Hyperlipidemia, unspecified: Secondary | ICD-10-CM | POA: Diagnosis not present

## 2021-05-03 DIAGNOSIS — Z87891 Personal history of nicotine dependence: Secondary | ICD-10-CM | POA: Diagnosis not present

## 2021-05-03 DIAGNOSIS — E039 Hypothyroidism, unspecified: Secondary | ICD-10-CM | POA: Diagnosis not present

## 2021-05-03 DIAGNOSIS — E1169 Type 2 diabetes mellitus with other specified complication: Secondary | ICD-10-CM | POA: Diagnosis not present

## 2021-05-03 DIAGNOSIS — Z96651 Presence of right artificial knee joint: Secondary | ICD-10-CM | POA: Diagnosis not present

## 2021-05-03 DIAGNOSIS — Z9181 History of falling: Secondary | ICD-10-CM | POA: Diagnosis not present

## 2021-05-03 DIAGNOSIS — Z7982 Long term (current) use of aspirin: Secondary | ICD-10-CM | POA: Diagnosis not present

## 2021-05-03 DIAGNOSIS — E559 Vitamin D deficiency, unspecified: Secondary | ICD-10-CM | POA: Diagnosis not present

## 2021-05-05 DIAGNOSIS — G4700 Insomnia, unspecified: Secondary | ICD-10-CM | POA: Diagnosis not present

## 2021-05-05 DIAGNOSIS — H409 Unspecified glaucoma: Secondary | ICD-10-CM | POA: Diagnosis not present

## 2021-05-05 DIAGNOSIS — N182 Chronic kidney disease, stage 2 (mild): Secondary | ICD-10-CM | POA: Diagnosis not present

## 2021-05-05 DIAGNOSIS — M461 Sacroiliitis, not elsewhere classified: Secondary | ICD-10-CM | POA: Diagnosis not present

## 2021-05-05 DIAGNOSIS — Z86010 Personal history of colonic polyps: Secondary | ICD-10-CM | POA: Diagnosis not present

## 2021-05-05 DIAGNOSIS — E039 Hypothyroidism, unspecified: Secondary | ICD-10-CM | POA: Diagnosis not present

## 2021-05-05 DIAGNOSIS — E559 Vitamin D deficiency, unspecified: Secondary | ICD-10-CM | POA: Diagnosis not present

## 2021-05-05 DIAGNOSIS — I129 Hypertensive chronic kidney disease with stage 1 through stage 4 chronic kidney disease, or unspecified chronic kidney disease: Secondary | ICD-10-CM | POA: Diagnosis not present

## 2021-05-05 DIAGNOSIS — Z7982 Long term (current) use of aspirin: Secondary | ICD-10-CM | POA: Diagnosis not present

## 2021-05-05 DIAGNOSIS — Z96651 Presence of right artificial knee joint: Secondary | ICD-10-CM | POA: Diagnosis not present

## 2021-05-05 DIAGNOSIS — Z87891 Personal history of nicotine dependence: Secondary | ICD-10-CM | POA: Diagnosis not present

## 2021-05-05 DIAGNOSIS — Z9181 History of falling: Secondary | ICD-10-CM | POA: Diagnosis not present

## 2021-05-05 DIAGNOSIS — E1169 Type 2 diabetes mellitus with other specified complication: Secondary | ICD-10-CM | POA: Diagnosis not present

## 2021-05-05 DIAGNOSIS — T84030D Mechanical loosening of internal right hip prosthetic joint, subsequent encounter: Secondary | ICD-10-CM | POA: Diagnosis not present

## 2021-05-05 DIAGNOSIS — N3281 Overactive bladder: Secondary | ICD-10-CM | POA: Diagnosis not present

## 2021-05-05 DIAGNOSIS — K5790 Diverticulosis of intestine, part unspecified, without perforation or abscess without bleeding: Secondary | ICD-10-CM | POA: Diagnosis not present

## 2021-05-05 DIAGNOSIS — E1142 Type 2 diabetes mellitus with diabetic polyneuropathy: Secondary | ICD-10-CM | POA: Diagnosis not present

## 2021-05-05 DIAGNOSIS — E785 Hyperlipidemia, unspecified: Secondary | ICD-10-CM | POA: Diagnosis not present

## 2021-05-05 DIAGNOSIS — E1122 Type 2 diabetes mellitus with diabetic chronic kidney disease: Secondary | ICD-10-CM | POA: Diagnosis not present

## 2021-05-05 DIAGNOSIS — M4807 Spinal stenosis, lumbosacral region: Secondary | ICD-10-CM | POA: Diagnosis not present

## 2021-05-05 DIAGNOSIS — I700 Atherosclerosis of aorta: Secondary | ICD-10-CM | POA: Diagnosis not present

## 2021-05-05 DIAGNOSIS — K219 Gastro-esophageal reflux disease without esophagitis: Secondary | ICD-10-CM | POA: Diagnosis not present

## 2021-05-07 DIAGNOSIS — I700 Atherosclerosis of aorta: Secondary | ICD-10-CM | POA: Diagnosis not present

## 2021-05-07 DIAGNOSIS — G4700 Insomnia, unspecified: Secondary | ICD-10-CM | POA: Diagnosis not present

## 2021-05-07 DIAGNOSIS — M4807 Spinal stenosis, lumbosacral region: Secondary | ICD-10-CM | POA: Diagnosis not present

## 2021-05-07 DIAGNOSIS — N182 Chronic kidney disease, stage 2 (mild): Secondary | ICD-10-CM | POA: Diagnosis not present

## 2021-05-07 DIAGNOSIS — E1142 Type 2 diabetes mellitus with diabetic polyneuropathy: Secondary | ICD-10-CM | POA: Diagnosis not present

## 2021-05-07 DIAGNOSIS — E039 Hypothyroidism, unspecified: Secondary | ICD-10-CM | POA: Diagnosis not present

## 2021-05-07 DIAGNOSIS — M461 Sacroiliitis, not elsewhere classified: Secondary | ICD-10-CM | POA: Diagnosis not present

## 2021-05-07 DIAGNOSIS — Z96651 Presence of right artificial knee joint: Secondary | ICD-10-CM | POA: Diagnosis not present

## 2021-05-07 DIAGNOSIS — E1122 Type 2 diabetes mellitus with diabetic chronic kidney disease: Secondary | ICD-10-CM | POA: Diagnosis not present

## 2021-05-07 DIAGNOSIS — Z7982 Long term (current) use of aspirin: Secondary | ICD-10-CM | POA: Diagnosis not present

## 2021-05-07 DIAGNOSIS — T84030D Mechanical loosening of internal right hip prosthetic joint, subsequent encounter: Secondary | ICD-10-CM | POA: Diagnosis not present

## 2021-05-07 DIAGNOSIS — N3281 Overactive bladder: Secondary | ICD-10-CM | POA: Diagnosis not present

## 2021-05-07 DIAGNOSIS — H409 Unspecified glaucoma: Secondary | ICD-10-CM | POA: Diagnosis not present

## 2021-05-07 DIAGNOSIS — I129 Hypertensive chronic kidney disease with stage 1 through stage 4 chronic kidney disease, or unspecified chronic kidney disease: Secondary | ICD-10-CM | POA: Diagnosis not present

## 2021-05-07 DIAGNOSIS — E559 Vitamin D deficiency, unspecified: Secondary | ICD-10-CM | POA: Diagnosis not present

## 2021-05-07 DIAGNOSIS — Z87891 Personal history of nicotine dependence: Secondary | ICD-10-CM | POA: Diagnosis not present

## 2021-05-07 DIAGNOSIS — K219 Gastro-esophageal reflux disease without esophagitis: Secondary | ICD-10-CM | POA: Diagnosis not present

## 2021-05-07 DIAGNOSIS — E1169 Type 2 diabetes mellitus with other specified complication: Secondary | ICD-10-CM | POA: Diagnosis not present

## 2021-05-07 DIAGNOSIS — Z9181 History of falling: Secondary | ICD-10-CM | POA: Diagnosis not present

## 2021-05-07 DIAGNOSIS — K5790 Diverticulosis of intestine, part unspecified, without perforation or abscess without bleeding: Secondary | ICD-10-CM | POA: Diagnosis not present

## 2021-05-07 DIAGNOSIS — E785 Hyperlipidemia, unspecified: Secondary | ICD-10-CM | POA: Diagnosis not present

## 2021-05-07 DIAGNOSIS — Z86010 Personal history of colonic polyps: Secondary | ICD-10-CM | POA: Diagnosis not present

## 2021-05-10 DIAGNOSIS — N182 Chronic kidney disease, stage 2 (mild): Secondary | ICD-10-CM | POA: Diagnosis not present

## 2021-05-10 DIAGNOSIS — K5790 Diverticulosis of intestine, part unspecified, without perforation or abscess without bleeding: Secondary | ICD-10-CM | POA: Diagnosis not present

## 2021-05-10 DIAGNOSIS — N3281 Overactive bladder: Secondary | ICD-10-CM | POA: Diagnosis not present

## 2021-05-10 DIAGNOSIS — I700 Atherosclerosis of aorta: Secondary | ICD-10-CM | POA: Diagnosis not present

## 2021-05-10 DIAGNOSIS — Z96651 Presence of right artificial knee joint: Secondary | ICD-10-CM | POA: Diagnosis not present

## 2021-05-10 DIAGNOSIS — K219 Gastro-esophageal reflux disease without esophagitis: Secondary | ICD-10-CM | POA: Diagnosis not present

## 2021-05-10 DIAGNOSIS — Z87891 Personal history of nicotine dependence: Secondary | ICD-10-CM | POA: Diagnosis not present

## 2021-05-10 DIAGNOSIS — E039 Hypothyroidism, unspecified: Secondary | ICD-10-CM | POA: Diagnosis not present

## 2021-05-10 DIAGNOSIS — Z9181 History of falling: Secondary | ICD-10-CM | POA: Diagnosis not present

## 2021-05-10 DIAGNOSIS — E1142 Type 2 diabetes mellitus with diabetic polyneuropathy: Secondary | ICD-10-CM | POA: Diagnosis not present

## 2021-05-10 DIAGNOSIS — E1122 Type 2 diabetes mellitus with diabetic chronic kidney disease: Secondary | ICD-10-CM | POA: Diagnosis not present

## 2021-05-10 DIAGNOSIS — E559 Vitamin D deficiency, unspecified: Secondary | ICD-10-CM | POA: Diagnosis not present

## 2021-05-10 DIAGNOSIS — I129 Hypertensive chronic kidney disease with stage 1 through stage 4 chronic kidney disease, or unspecified chronic kidney disease: Secondary | ICD-10-CM | POA: Diagnosis not present

## 2021-05-10 DIAGNOSIS — E1169 Type 2 diabetes mellitus with other specified complication: Secondary | ICD-10-CM | POA: Diagnosis not present

## 2021-05-10 DIAGNOSIS — Z7982 Long term (current) use of aspirin: Secondary | ICD-10-CM | POA: Diagnosis not present

## 2021-05-10 DIAGNOSIS — M461 Sacroiliitis, not elsewhere classified: Secondary | ICD-10-CM | POA: Diagnosis not present

## 2021-05-10 DIAGNOSIS — M4807 Spinal stenosis, lumbosacral region: Secondary | ICD-10-CM | POA: Diagnosis not present

## 2021-05-10 DIAGNOSIS — Z86010 Personal history of colonic polyps: Secondary | ICD-10-CM | POA: Diagnosis not present

## 2021-05-10 DIAGNOSIS — E785 Hyperlipidemia, unspecified: Secondary | ICD-10-CM | POA: Diagnosis not present

## 2021-05-10 DIAGNOSIS — H409 Unspecified glaucoma: Secondary | ICD-10-CM | POA: Diagnosis not present

## 2021-05-10 DIAGNOSIS — G4700 Insomnia, unspecified: Secondary | ICD-10-CM | POA: Diagnosis not present

## 2021-05-10 DIAGNOSIS — T84030D Mechanical loosening of internal right hip prosthetic joint, subsequent encounter: Secondary | ICD-10-CM | POA: Diagnosis not present

## 2021-05-12 DIAGNOSIS — Z96651 Presence of right artificial knee joint: Secondary | ICD-10-CM | POA: Diagnosis not present

## 2021-05-12 DIAGNOSIS — M461 Sacroiliitis, not elsewhere classified: Secondary | ICD-10-CM | POA: Diagnosis not present

## 2021-05-12 DIAGNOSIS — Z87891 Personal history of nicotine dependence: Secondary | ICD-10-CM | POA: Diagnosis not present

## 2021-05-12 DIAGNOSIS — Z86010 Personal history of colonic polyps: Secondary | ICD-10-CM | POA: Diagnosis not present

## 2021-05-12 DIAGNOSIS — E785 Hyperlipidemia, unspecified: Secondary | ICD-10-CM | POA: Diagnosis not present

## 2021-05-12 DIAGNOSIS — N182 Chronic kidney disease, stage 2 (mild): Secondary | ICD-10-CM | POA: Diagnosis not present

## 2021-05-12 DIAGNOSIS — I129 Hypertensive chronic kidney disease with stage 1 through stage 4 chronic kidney disease, or unspecified chronic kidney disease: Secondary | ICD-10-CM | POA: Diagnosis not present

## 2021-05-12 DIAGNOSIS — K219 Gastro-esophageal reflux disease without esophagitis: Secondary | ICD-10-CM | POA: Diagnosis not present

## 2021-05-12 DIAGNOSIS — K5790 Diverticulosis of intestine, part unspecified, without perforation or abscess without bleeding: Secondary | ICD-10-CM | POA: Diagnosis not present

## 2021-05-12 DIAGNOSIS — E1169 Type 2 diabetes mellitus with other specified complication: Secondary | ICD-10-CM | POA: Diagnosis not present

## 2021-05-12 DIAGNOSIS — G4700 Insomnia, unspecified: Secondary | ICD-10-CM | POA: Diagnosis not present

## 2021-05-12 DIAGNOSIS — E1122 Type 2 diabetes mellitus with diabetic chronic kidney disease: Secondary | ICD-10-CM | POA: Diagnosis not present

## 2021-05-12 DIAGNOSIS — I700 Atherosclerosis of aorta: Secondary | ICD-10-CM | POA: Diagnosis not present

## 2021-05-12 DIAGNOSIS — E039 Hypothyroidism, unspecified: Secondary | ICD-10-CM | POA: Diagnosis not present

## 2021-05-12 DIAGNOSIS — E559 Vitamin D deficiency, unspecified: Secondary | ICD-10-CM | POA: Diagnosis not present

## 2021-05-12 DIAGNOSIS — T84030D Mechanical loosening of internal right hip prosthetic joint, subsequent encounter: Secondary | ICD-10-CM | POA: Diagnosis not present

## 2021-05-12 DIAGNOSIS — M4807 Spinal stenosis, lumbosacral region: Secondary | ICD-10-CM | POA: Diagnosis not present

## 2021-05-12 DIAGNOSIS — Z9181 History of falling: Secondary | ICD-10-CM | POA: Diagnosis not present

## 2021-05-12 DIAGNOSIS — H409 Unspecified glaucoma: Secondary | ICD-10-CM | POA: Diagnosis not present

## 2021-05-12 DIAGNOSIS — E1142 Type 2 diabetes mellitus with diabetic polyneuropathy: Secondary | ICD-10-CM | POA: Diagnosis not present

## 2021-05-12 DIAGNOSIS — Z7982 Long term (current) use of aspirin: Secondary | ICD-10-CM | POA: Diagnosis not present

## 2021-05-12 DIAGNOSIS — N3281 Overactive bladder: Secondary | ICD-10-CM | POA: Diagnosis not present

## 2021-05-14 DIAGNOSIS — I129 Hypertensive chronic kidney disease with stage 1 through stage 4 chronic kidney disease, or unspecified chronic kidney disease: Secondary | ICD-10-CM | POA: Diagnosis not present

## 2021-05-14 DIAGNOSIS — Z7982 Long term (current) use of aspirin: Secondary | ICD-10-CM | POA: Diagnosis not present

## 2021-05-14 DIAGNOSIS — Z87891 Personal history of nicotine dependence: Secondary | ICD-10-CM | POA: Diagnosis not present

## 2021-05-14 DIAGNOSIS — I700 Atherosclerosis of aorta: Secondary | ICD-10-CM | POA: Diagnosis not present

## 2021-05-14 DIAGNOSIS — E1169 Type 2 diabetes mellitus with other specified complication: Secondary | ICD-10-CM | POA: Diagnosis not present

## 2021-05-14 DIAGNOSIS — M4807 Spinal stenosis, lumbosacral region: Secondary | ICD-10-CM | POA: Diagnosis not present

## 2021-05-14 DIAGNOSIS — E559 Vitamin D deficiency, unspecified: Secondary | ICD-10-CM | POA: Diagnosis not present

## 2021-05-14 DIAGNOSIS — Z9181 History of falling: Secondary | ICD-10-CM | POA: Diagnosis not present

## 2021-05-14 DIAGNOSIS — E1122 Type 2 diabetes mellitus with diabetic chronic kidney disease: Secondary | ICD-10-CM | POA: Diagnosis not present

## 2021-05-14 DIAGNOSIS — Z96651 Presence of right artificial knee joint: Secondary | ICD-10-CM | POA: Diagnosis not present

## 2021-05-14 DIAGNOSIS — E039 Hypothyroidism, unspecified: Secondary | ICD-10-CM | POA: Diagnosis not present

## 2021-05-14 DIAGNOSIS — E785 Hyperlipidemia, unspecified: Secondary | ICD-10-CM | POA: Diagnosis not present

## 2021-05-14 DIAGNOSIS — Z86010 Personal history of colonic polyps: Secondary | ICD-10-CM | POA: Diagnosis not present

## 2021-05-14 DIAGNOSIS — T84030D Mechanical loosening of internal right hip prosthetic joint, subsequent encounter: Secondary | ICD-10-CM | POA: Diagnosis not present

## 2021-05-14 DIAGNOSIS — G4700 Insomnia, unspecified: Secondary | ICD-10-CM | POA: Diagnosis not present

## 2021-05-14 DIAGNOSIS — K219 Gastro-esophageal reflux disease without esophagitis: Secondary | ICD-10-CM | POA: Diagnosis not present

## 2021-05-14 DIAGNOSIS — N3281 Overactive bladder: Secondary | ICD-10-CM | POA: Diagnosis not present

## 2021-05-14 DIAGNOSIS — H409 Unspecified glaucoma: Secondary | ICD-10-CM | POA: Diagnosis not present

## 2021-05-14 DIAGNOSIS — N182 Chronic kidney disease, stage 2 (mild): Secondary | ICD-10-CM | POA: Diagnosis not present

## 2021-05-14 DIAGNOSIS — K5790 Diverticulosis of intestine, part unspecified, without perforation or abscess without bleeding: Secondary | ICD-10-CM | POA: Diagnosis not present

## 2021-05-14 DIAGNOSIS — M461 Sacroiliitis, not elsewhere classified: Secondary | ICD-10-CM | POA: Diagnosis not present

## 2021-05-14 DIAGNOSIS — E1142 Type 2 diabetes mellitus with diabetic polyneuropathy: Secondary | ICD-10-CM | POA: Diagnosis not present

## 2021-05-19 DIAGNOSIS — I700 Atherosclerosis of aorta: Secondary | ICD-10-CM | POA: Diagnosis not present

## 2021-05-19 DIAGNOSIS — E559 Vitamin D deficiency, unspecified: Secondary | ICD-10-CM | POA: Diagnosis not present

## 2021-05-19 DIAGNOSIS — I129 Hypertensive chronic kidney disease with stage 1 through stage 4 chronic kidney disease, or unspecified chronic kidney disease: Secondary | ICD-10-CM | POA: Diagnosis not present

## 2021-05-19 DIAGNOSIS — N3281 Overactive bladder: Secondary | ICD-10-CM | POA: Diagnosis not present

## 2021-05-19 DIAGNOSIS — M4807 Spinal stenosis, lumbosacral region: Secondary | ICD-10-CM | POA: Diagnosis not present

## 2021-05-19 DIAGNOSIS — N182 Chronic kidney disease, stage 2 (mild): Secondary | ICD-10-CM | POA: Diagnosis not present

## 2021-05-19 DIAGNOSIS — E785 Hyperlipidemia, unspecified: Secondary | ICD-10-CM | POA: Diagnosis not present

## 2021-05-19 DIAGNOSIS — Z9181 History of falling: Secondary | ICD-10-CM | POA: Diagnosis not present

## 2021-05-19 DIAGNOSIS — E1122 Type 2 diabetes mellitus with diabetic chronic kidney disease: Secondary | ICD-10-CM | POA: Diagnosis not present

## 2021-05-19 DIAGNOSIS — Z86010 Personal history of colonic polyps: Secondary | ICD-10-CM | POA: Diagnosis not present

## 2021-05-19 DIAGNOSIS — K5790 Diverticulosis of intestine, part unspecified, without perforation or abscess without bleeding: Secondary | ICD-10-CM | POA: Diagnosis not present

## 2021-05-19 DIAGNOSIS — M461 Sacroiliitis, not elsewhere classified: Secondary | ICD-10-CM | POA: Diagnosis not present

## 2021-05-19 DIAGNOSIS — E1142 Type 2 diabetes mellitus with diabetic polyneuropathy: Secondary | ICD-10-CM | POA: Diagnosis not present

## 2021-05-19 DIAGNOSIS — G4700 Insomnia, unspecified: Secondary | ICD-10-CM | POA: Diagnosis not present

## 2021-05-19 DIAGNOSIS — E1169 Type 2 diabetes mellitus with other specified complication: Secondary | ICD-10-CM | POA: Diagnosis not present

## 2021-05-19 DIAGNOSIS — Z96651 Presence of right artificial knee joint: Secondary | ICD-10-CM | POA: Diagnosis not present

## 2021-05-19 DIAGNOSIS — H409 Unspecified glaucoma: Secondary | ICD-10-CM | POA: Diagnosis not present

## 2021-05-19 DIAGNOSIS — T84030D Mechanical loosening of internal right hip prosthetic joint, subsequent encounter: Secondary | ICD-10-CM | POA: Diagnosis not present

## 2021-05-19 DIAGNOSIS — Z7982 Long term (current) use of aspirin: Secondary | ICD-10-CM | POA: Diagnosis not present

## 2021-05-19 DIAGNOSIS — Z87891 Personal history of nicotine dependence: Secondary | ICD-10-CM | POA: Diagnosis not present

## 2021-05-19 DIAGNOSIS — E039 Hypothyroidism, unspecified: Secondary | ICD-10-CM | POA: Diagnosis not present

## 2021-05-19 DIAGNOSIS — K219 Gastro-esophageal reflux disease without esophagitis: Secondary | ICD-10-CM | POA: Diagnosis not present

## 2021-05-26 DIAGNOSIS — Z9889 Other specified postprocedural states: Secondary | ICD-10-CM | POA: Diagnosis not present

## 2021-05-26 DIAGNOSIS — M25551 Pain in right hip: Secondary | ICD-10-CM | POA: Diagnosis not present

## 2021-05-27 DIAGNOSIS — Z9889 Other specified postprocedural states: Secondary | ICD-10-CM | POA: Diagnosis not present

## 2021-05-27 DIAGNOSIS — M25651 Stiffness of right hip, not elsewhere classified: Secondary | ICD-10-CM | POA: Diagnosis not present

## 2021-05-27 DIAGNOSIS — R531 Weakness: Secondary | ICD-10-CM | POA: Diagnosis not present

## 2021-05-27 DIAGNOSIS — M25551 Pain in right hip: Secondary | ICD-10-CM | POA: Diagnosis not present

## 2021-05-31 ENCOUNTER — Other Ambulatory Visit: Payer: Self-pay | Admitting: Internal Medicine

## 2021-06-01 DIAGNOSIS — Z9889 Other specified postprocedural states: Secondary | ICD-10-CM | POA: Diagnosis not present

## 2021-06-01 DIAGNOSIS — M25651 Stiffness of right hip, not elsewhere classified: Secondary | ICD-10-CM | POA: Diagnosis not present

## 2021-06-01 DIAGNOSIS — R531 Weakness: Secondary | ICD-10-CM | POA: Diagnosis not present

## 2021-06-01 DIAGNOSIS — M25551 Pain in right hip: Secondary | ICD-10-CM | POA: Diagnosis not present

## 2021-06-03 DIAGNOSIS — M25651 Stiffness of right hip, not elsewhere classified: Secondary | ICD-10-CM | POA: Diagnosis not present

## 2021-06-03 DIAGNOSIS — R531 Weakness: Secondary | ICD-10-CM | POA: Diagnosis not present

## 2021-06-03 DIAGNOSIS — Z9889 Other specified postprocedural states: Secondary | ICD-10-CM | POA: Diagnosis not present

## 2021-06-03 DIAGNOSIS — M25551 Pain in right hip: Secondary | ICD-10-CM | POA: Diagnosis not present

## 2021-06-08 DIAGNOSIS — M25551 Pain in right hip: Secondary | ICD-10-CM | POA: Diagnosis not present

## 2021-06-08 DIAGNOSIS — Z9889 Other specified postprocedural states: Secondary | ICD-10-CM | POA: Diagnosis not present

## 2021-06-08 DIAGNOSIS — M25651 Stiffness of right hip, not elsewhere classified: Secondary | ICD-10-CM | POA: Diagnosis not present

## 2021-06-08 DIAGNOSIS — R531 Weakness: Secondary | ICD-10-CM | POA: Diagnosis not present

## 2021-06-10 DIAGNOSIS — Z9889 Other specified postprocedural states: Secondary | ICD-10-CM | POA: Diagnosis not present

## 2021-06-10 DIAGNOSIS — M25551 Pain in right hip: Secondary | ICD-10-CM | POA: Diagnosis not present

## 2021-06-10 DIAGNOSIS — M25651 Stiffness of right hip, not elsewhere classified: Secondary | ICD-10-CM | POA: Diagnosis not present

## 2021-06-10 DIAGNOSIS — R531 Weakness: Secondary | ICD-10-CM | POA: Diagnosis not present

## 2021-06-28 ENCOUNTER — Other Ambulatory Visit: Payer: Self-pay | Admitting: Adult Health

## 2021-06-28 DIAGNOSIS — E1169 Type 2 diabetes mellitus with other specified complication: Secondary | ICD-10-CM

## 2021-06-29 ENCOUNTER — Other Ambulatory Visit: Payer: Self-pay | Admitting: Adult Health

## 2021-06-29 DIAGNOSIS — H401131 Primary open-angle glaucoma, bilateral, mild stage: Secondary | ICD-10-CM | POA: Diagnosis not present

## 2021-07-12 ENCOUNTER — Encounter: Payer: Medicare Other | Admitting: Internal Medicine

## 2021-07-27 ENCOUNTER — Other Ambulatory Visit: Payer: Self-pay | Admitting: Adult Health

## 2021-07-27 DIAGNOSIS — E1142 Type 2 diabetes mellitus with diabetic polyneuropathy: Secondary | ICD-10-CM

## 2021-08-04 NOTE — Progress Notes (Signed)
Annual Screening/Preventative Visit & Comprehensive Evaluation &  Examination  Future Appointments  Date Time Provider Department  08/05/2021                             CPE 11:00 AM Unk Pinto, MD [facility]  04/08/2022                             Wellness 11:00 AM Wallace Going  08/10/2022                            CPE 11:00 AM Unk Pinto, MD [facility]         This very nice 81 y.o. DBF  presents for a Screening /Preventative Visit & comprehensive evaluation and management of multiple medical co-morbidities.  Patient has been followed for HTN, HLD, T2_DM    and Vitamin D Deficiency. Patient has Gout controlled on her meds.        HTN predates circa 1997. Patient's BP has been controlled at home and patient denies any cardiac symptoms as chest pain, palpitations, shortness of breath, dizziness or ankle swelling. Today's BP -  130/82  is at goal .       Patient's hyperlipidemia is controlled with diet and Rosuvastatin. Patient denies myalgias or other medication SE's. Last lipids were at goal :  Lab Results  Component Value Date   CHOL 174 08/05/2021   HDL 87 08/05/2021   LDLCALC 71 08/05/2021   TRIG 81 08/05/2021   CHOLHDL 2.0 08/05/2021         Patient has moderate Obesity (BMI 35+) and  diet controlled T2_NIDDM (2013) w/CKD3a (GFR 58)  & with  PSN.  Patient denies reactive hypoglycemic symptoms, visual blurring, diabetic polys or paresthesias. Last A1c was not at goal:  Lab Results  Component Value Date   HGBA1C 6.8 (H) 08/05/2021         Finally, patient has history of Vitamin D Deficiency ("26" /2008) and last Vitamin D was at goal:  Lab Results  Component Value Date   VD25OH 67 08/05/2021       Current Outpatient Medications on File Prior to Visit  Medication Sig   ALPRAZolam 1 MG tab Take 1/2-1 tablet 1 to 2 /day ONLY if needed for sleep    aspirin 81 MG tab Takes two tablets daily   bisoprolol-hctz  10-6.25 MG tab TAKE 1 TABLET EVERY DAY     VITAMIN D 5000 u Take 1 tablet daily.   furosemide 40 MG tab Take  1 tablet  Daily   gabapentin  600 MG tab Take  1/2 to 1 tablet  2 to 3 x /Daily  as needed for Pain   XALATAN 0.005 % ophth  soln Place 1 drop into both eyes daily.    levothyroxine 100 MCG tab Take 1 tablet Daily   Magnesium 500 MG CAPS Take 2 capsules  daily.    Omega-3 Fatty Acids  Take 1 capsule daily.   omeprazole 20 MG cap Take  1 capsule  2 x /day    rosuvastatin  5 MG tabl Take  1 tablet  3 x /week  on Mon Wed & Fri  for Cholesterol   timolol (TIMOPTIC) 0.5 % ophth  soln Place 1 drop into both eyes 2 times daily.      Allergies  Allergen  Reactions   Meloxicam Swelling    Weight gain     Past Medical History:  Diagnosis Date   Anal fissure    Arthritis    Carotid stenosis, <50%, bilateral  12/20/2018   Colon polyps    hyperplastic   Diverticulosis    GERD (gastroesophageal reflux disease)    Glaucoma    Hemorrhoids    Hyperlipidemia    Hypertension    Hypothyroidism    Prediabetes      Health Maintenance  Topic Date Due   Hepatitis C Screening  Never done   OPHTHALMOLOGY EXAM  04/13/2018   FOOT EXAM  04/02/2020   HEMOGLOBIN A1C  05/17/2020   TETANUS/TDAP  07/17/2020 (Originally 04/25/2017)   MAMMOGRAM  09/10/2020   INFLUENZA VACCINE  09/28/2020   DEXA SCAN  Completed   COVID-19 Vaccine  Completed   PNA vac Low Risk Adult  Completed   HPV VACCINES  Aged Out     Immunization History  Administered Date(s) Administered   Influenza, High Dose  11/25/2016, 11/24/2017, 10/17/2018, 11/14/2018   Influenza-m 12/09/2013, 11/14/2015, 11/23/2016   PFIZER SARS-COV-2 Vacc  05/12/2019, 06/01/2019, 12/16/2019   Pneumococcal -13 04/21/2015   Pneumococcal -23 05/29/2008   Td 04/26/2007    Last Colon - 01/07/2018 - Dr Fuller Plan - Negative and recc no f/u due to age   Last MGM - 09/11/2019   Past Surgical History:  Procedure Laterality Date   ABDOMINAL HYSTERECTOMY     APPENDECTOMY     BLADDER  SUSPENSION     COLONOSCOPY     JOINT REPLACEMENT  2007   rt hip replacement   KNEE ARTHROSCOPY  03/28/2011   Procedure: ARTHROSCOPY KNEE;  Surgeon: Kerin Salen, MD;  Location: [facility];  Service: Orthopedics;  Laterality: Right;  with partial lateral meniscectomy, Debridement of Chondromalacia     Family History  Problem Relation Age of Onset   Colon polyps Sister    Diabetes Sister    Esophageal cancer Other    Colon cancer Neg Hx    Rectal cancer Neg Hx    Stomach cancer Neg Hx    Breast cancer Neg Hx      Social History   Tobacco Use   Smoking status: Former Smoker    Quit date: 03/22/1996    Years since quitting: 24.3   Smokeless tobacco: Never Used  Vaping Use   Vaping Use: Never used   Alcohol use: Yes    Alcohol/week: 2.0 standard drinks    Types: 2 drink(s) per week    Comment: occ   Drug use: No      ROS Constitutional: Denies fever, chills, weight loss/gain, headaches, insomnia,  night sweats, and change in appetite. Does c/o fatigue. Eyes: Denies redness, blurred vision, diplopia, discharge, itchy, watery eyes.  ENT: Denies discharge, congestion, post nasal drip, epistaxis, sore throat, earache, hearing loss, dental pain, Tinnitus, Vertigo, Sinus pain, snoring.  Cardio: Denies chest pain, palpitations, irregular heartbeat, syncope, dyspnea, diaphoresis, orthopnea, PND, claudication, edema Respiratory: denies cough, dyspnea, DOE, pleurisy, hoarseness, laryngitis, wheezing.  Gastrointestinal: Denies dysphagia, heartburn, reflux, water brash, pain, cramps, nausea, vomiting, bloating, diarrhea, constipation, hematemesis, melena, hematochezia, jaundice, hemorrhoids Genitourinary: Denies dysuria, frequency, urgency, nocturia, hesitancy, discharge, hematuria, flank pain Breast: Breast lumps, nipple discharge, bleeding.  Musculoskeletal: Denies arthralgia, myalgia, stiffness, Jt. Swelling, pain, limp, and strain/sprain. Denies falls. Skin: Denies  puritis, rash, hives, warts, acne, eczema, changing in skin lesion Neuro: No weakness, tremor, incoordination, spasms, paresthesia, pain Psychiatric: Denies  confusion, memory loss, sensory loss. Denies Depression. Endocrine: Denies change in weight, skin, hair change, nocturia, and paresthesia, diabetic polys, visual blurring, hyper / hypo glycemic episodes.  Heme/Lymph: No excessive bleeding, bruising, enlarged lymph nodes.  Physical Exam  BP 128/78   Pulse (!) 55   Temp 97.9 F (36.6 C)   Resp 16   Ht 5' 7.5" (1.715 m)   Wt 220 lb 12.8 oz (100.2 kg)   SpO2 98%   BMI 34.07 kg/m   General Appearance: Well nourished, well groomed and in no apparent distress.  Eyes: PERRLA, EOMs, conjunctiva no swelling or erythema, normal fundi and vessels. Sinuses: No frontal/maxillary tenderness ENT/Mouth: EACs patent / TMs  nl. Nares clear without erythema, swelling, mucoid exudates. Oral hygiene is good. No erythema, swelling, or exudate. Tongue normal, non-obstructing. Tonsils not swollen or erythematous. Hearing normal.  Neck: Supple, thyroid not palpable. No bruits, nodes or JVD. Respiratory: Respiratory effort normal.  BS equal and clear bilateral without rales, rhonci, wheezing or stridor. Cardio: Heart sounds are normal with regular rate and rhythm and no murmurs, rubs or gallops. Peripheral pulses are normal and equal bilaterally without edema. No aortic or femoral bruits. Chest: symmetric with normal excursions and percussion. Breasts: Symmetric, without lumps, nipple discharge, retractions, or fibrocystic changes.  Abdomen: Flat, soft with bowel sounds active. Nontender, no guarding, rebound, hernias, masses, or organomegaly.  Lymphatics: Non tender without lymphadenopathy.  Genitourinary:  Musculoskeletal: Full ROM all peripheral extremities, joint stability, 5/5 strength, and normal gait. Skin: Warm and dry without rashes, lesions, cyanosis, clubbing or  ecchymosis.  Neuro: Cranial  nerves intact, reflexes equal bilaterally. Normal muscle tone, no cerebellar symptoms. Sensation intact.  Pysch: Alert and oriented X 3, normal affect, Insight and Judgment appropriate.    Assessment and Plan  1. Annual Preventative Screening Examination   2. Essential hypertension  - EKG 12-Lead - Urinalysis, Routine w reflex microscopic - Microalbumin / creatinine urine ratio - CBC with Differential/Platelet - COMPLETE METABOLIC PANEL WITH GFR - Magnesium - TSH  3. Hyperlipidemia associated with type 2 diabetes mellitus ([facility])  - EKG 12-Lead - Lipid panel - TSH  4. Controlled type 2 diabetes mellitus with stage 3 chronic  kidney disease, without long-term current use of insulin (HCC)  - EKG 12-Lead - Urinalysis, Routine w reflex microscopic - Microalbumin / creatinine urine ratio - HM DIABETES FOOT EXAM - PR LOW EXTEMITY NEUR EXAM DOCUM - PTH, intact and calcium - Hemoglobin A1c - Insulin, random  5. Vitamin D deficiency  - VITAMIN D 25 Hydroxy   6. Hypothyroidism,  - TSH  7. Gout  - Uric acid  8. Gastroesophageal reflux disease  - CBC with Differential/Platelet  9. Diabetic peripheral neuropathy (HCC)  - Hemoglobin A1c  10. Morbid obesity (HCC) BMI 35 associated with htn, hyperlipidemia, arthritis, T2DM   11. Screening for colorectal cancer  - POC Hemoccult Bld/Stl   - TSH  12. Screening for heart disease  - EKG 12-Lead  13. Former smoker  - EKG 12-Lead  14. Medication management  - Urinalysis, Routine w reflex microscopic - Microalbumin / creatinine urine ratio - Uric acid - PTH, intact and calcium - CBC with Differential/Platelet - COMPLETE METABOLIC PANEL WITH GFR - Magnesium - Lipid panel - TSH - Hemoglobin A1c - Insulin, random - VITAMIN D 25 Hydroxy           Patient was counseled in prudent diet to achieve/maintain BMI less than 25 for weight control, BP monitoring, regular exercise  and medications. Discussed med's  effects and SE's. Screening labs and tests as requested with regular follow-up as recommended. Over 40 minutes of exam, counseling, chart review and high complex critical decision making was performed.   Kirtland Bouchard, MD

## 2021-08-04 NOTE — Patient Instructions (Signed)

## 2021-08-05 ENCOUNTER — Encounter: Payer: Self-pay | Admitting: Internal Medicine

## 2021-08-05 ENCOUNTER — Ambulatory Visit (INDEPENDENT_AMBULATORY_CARE_PROVIDER_SITE_OTHER): Payer: Medicare Other | Admitting: Internal Medicine

## 2021-08-05 VITALS — BP 128/78 | HR 55 | Temp 97.9°F | Resp 16 | Ht 67.5 in | Wt 220.8 lb

## 2021-08-05 DIAGNOSIS — Z87891 Personal history of nicotine dependence: Secondary | ICD-10-CM

## 2021-08-05 DIAGNOSIS — E1122 Type 2 diabetes mellitus with diabetic chronic kidney disease: Secondary | ICD-10-CM | POA: Diagnosis not present

## 2021-08-05 DIAGNOSIS — Z136 Encounter for screening for cardiovascular disorders: Secondary | ICD-10-CM

## 2021-08-05 DIAGNOSIS — N183 Chronic kidney disease, stage 3 unspecified: Secondary | ICD-10-CM | POA: Diagnosis not present

## 2021-08-05 DIAGNOSIS — I10 Essential (primary) hypertension: Secondary | ICD-10-CM | POA: Diagnosis not present

## 2021-08-05 DIAGNOSIS — E1169 Type 2 diabetes mellitus with other specified complication: Secondary | ICD-10-CM | POA: Diagnosis not present

## 2021-08-05 DIAGNOSIS — E039 Hypothyroidism, unspecified: Secondary | ICD-10-CM

## 2021-08-05 DIAGNOSIS — E1142 Type 2 diabetes mellitus with diabetic polyneuropathy: Secondary | ICD-10-CM

## 2021-08-05 DIAGNOSIS — Z0001 Encounter for general adult medical examination with abnormal findings: Secondary | ICD-10-CM

## 2021-08-05 DIAGNOSIS — Z79899 Other long term (current) drug therapy: Secondary | ICD-10-CM

## 2021-08-05 DIAGNOSIS — K219 Gastro-esophageal reflux disease without esophagitis: Secondary | ICD-10-CM

## 2021-08-05 DIAGNOSIS — Z0000 Encounter for general adult medical examination without abnormal findings: Secondary | ICD-10-CM

## 2021-08-05 DIAGNOSIS — E559 Vitamin D deficiency, unspecified: Secondary | ICD-10-CM

## 2021-08-05 DIAGNOSIS — M109 Gout, unspecified: Secondary | ICD-10-CM

## 2021-08-05 DIAGNOSIS — E119 Type 2 diabetes mellitus without complications: Secondary | ICD-10-CM

## 2021-08-05 DIAGNOSIS — Z1211 Encounter for screening for malignant neoplasm of colon: Secondary | ICD-10-CM

## 2021-08-05 DIAGNOSIS — N951 Menopausal and female climacteric states: Secondary | ICD-10-CM

## 2021-08-05 MED ORDER — PHENTERMINE HCL 37.5 MG PO TABS: ORAL_TABLET | ORAL | 1 refills | Status: DC

## 2021-08-05 MED ORDER — TOPIRAMATE 50 MG PO TABS: ORAL_TABLET | ORAL | 1 refills | Status: DC

## 2021-08-05 MED ORDER — ESTRADIOL 0.025 MG/24HR TD PTWK: MEDICATED_PATCH | TRANSDERMAL | 3 refills | Status: DC

## 2021-08-06 LAB — COMPLETE METABOLIC PANEL WITH GFR
AG Ratio: 1.6 (calc) (ref 1.0–2.5)
ALT: 12 U/L (ref 6–29)
AST: 14 U/L (ref 10–35)
Albumin: 4.6 g/dL (ref 3.6–5.1)
Alkaline phosphatase (APISO): 71 U/L (ref 37–153)
BUN: 17 mg/dL (ref 7–25)
CO2: 30 mmol/L (ref 20–32)
Calcium: 10.5 mg/dL — ABNORMAL HIGH (ref 8.6–10.4)
Chloride: 103 mmol/L (ref 98–110)
Creat: 0.91 mg/dL (ref 0.60–0.95)
Globulin: 2.8 g/dL (calc) (ref 1.9–3.7)
Glucose, Bld: 124 mg/dL — ABNORMAL HIGH (ref 65–99)
Potassium: 4 mmol/L (ref 3.5–5.3)
Sodium: 141 mmol/L (ref 135–146)
Total Bilirubin: 0.7 mg/dL (ref 0.2–1.2)
Total Protein: 7.4 g/dL (ref 6.1–8.1)
eGFR: 64 mL/min/{1.73_m2} (ref >=60)

## 2021-08-06 LAB — URINALYSIS, ROUTINE W REFLEX MICROSCOPIC
Bilirubin Urine: NEGATIVE
Glucose, UA: NEGATIVE
Hgb urine dipstick: NEGATIVE
Ketones, ur: NEGATIVE
Leukocytes,Ua: NEGATIVE
Nitrite: NEGATIVE
Protein, ur: NEGATIVE
Specific Gravity, Urine: 1.006 (ref 1.001–1.035)
pH: 6 (ref 5.0–8.0)

## 2021-08-06 LAB — PTH, INTACT AND CALCIUM
Calcium: 10.5 mg/dL — ABNORMAL HIGH (ref 8.6–10.4)
PTH: 58 pg/mL (ref 16–77)

## 2021-08-06 LAB — CBC WITH DIFFERENTIAL/PLATELET
Absolute Monocytes: 272 cells/uL (ref 200–950)
Basophils Absolute: 51 cells/uL (ref 0–200)
Basophils Relative: 1.5 %
Eosinophils Absolute: 133 cells/uL (ref 15–500)
Eosinophils Relative: 3.9 %
HCT: 42.2 % (ref 35.0–45.0)
Hemoglobin: 13.9 g/dL (ref 11.7–15.5)
Lymphs Abs: 1618 cells/uL (ref 850–3900)
MCH: 25.2 pg — ABNORMAL LOW (ref 27.0–33.0)
MCHC: 32.9 g/dL (ref 32.0–36.0)
MCV: 76.6 fL — ABNORMAL LOW (ref 80.0–100.0)
MPV: 10.1 fL (ref 7.5–12.5)
Monocytes Relative: 8 %
Neutro Abs: 1326 cells/uL — ABNORMAL LOW (ref 1500–7800)
Neutrophils Relative %: 39 %
Platelets: 263 10*3/uL (ref 140–400)
RBC: 5.51 10*6/uL — ABNORMAL HIGH (ref 3.80–5.10)
RDW: 14.9 % (ref 11.0–15.0)
Total Lymphocyte: 47.6 %
WBC: 3.4 10*3/uL — ABNORMAL LOW (ref 3.8–10.8)

## 2021-08-06 LAB — MICROALBUMIN / CREATININE URINE RATIO
Creatinine, Urine: 15 mg/dL — ABNORMAL LOW (ref 20–275)
Microalb Creat Ratio: 33 mcg/mg creat — ABNORMAL HIGH (ref <30)
Microalb, Ur: 0.5 mg/dL

## 2021-08-06 LAB — HEMOGLOBIN A1C
Hgb A1c MFr Bld: 6.8 % of total Hgb — ABNORMAL HIGH (ref <5.7)
Mean Plasma Glucose: 148 mg/dL
eAG (mmol/L): 8.2 mmol/L

## 2021-08-06 LAB — MAGNESIUM: Magnesium: 2 mg/dL (ref 1.5–2.5)

## 2021-08-06 LAB — LIPID PANEL
Cholesterol: 174 mg/dL (ref <200)
HDL: 87 mg/dL (ref >=50)
LDL Cholesterol (Calc): 71 mg/dL (calc)
Non-HDL Cholesterol (Calc): 87 mg/dL (calc) (ref <130)
Total CHOL/HDL Ratio: 2 (calc) (ref <5.0)
Triglycerides: 81 mg/dL (ref <150)

## 2021-08-06 LAB — TSH: TSH: 1.25 mIU/L (ref 0.40–4.50)

## 2021-08-06 LAB — INSULIN, RANDOM: Insulin: 21.4 u[IU]/mL — ABNORMAL HIGH

## 2021-08-06 LAB — VITAMIN D 25 HYDROXY (VIT D DEFICIENCY, FRACTURES): Vit D, 25-Hydroxy: 67 ng/mL (ref 30–100)

## 2021-08-06 LAB — URIC ACID: Uric Acid, Serum: 5.2 mg/dL (ref 2.5–7.0)

## 2021-08-07 ENCOUNTER — Other Ambulatory Visit: Payer: Self-pay | Admitting: Internal Medicine

## 2021-08-07 MED ORDER — TRULICITY 0.75 MG/0.5ML ~~LOC~~ SOAJ: SUBCUTANEOUS | 0 refills | Status: DC

## 2021-08-07 MED ORDER — METFORMIN HCL ER 500 MG PO TB24: ORAL_TABLET | ORAL | 3 refills | Status: DC

## 2021-08-08 ENCOUNTER — Encounter: Payer: Self-pay | Admitting: Internal Medicine

## 2021-08-08 NOTE — Progress Notes (Signed)
please do not hesitate to contact me at the office or via My Chart.  <><><><><><><><><><><><><><><><><><><><><><><><><><><><><><><><><> <><><><><><><><><><><><><><><><><><><><><><><><><><><><><><><><><>  -  PTH is a hormone that regulates Calcium balance & is Normal  <><><><><><><><><><><><><><><><><><><><><><><><><><><><><><><><><>  -  CBC shows Hgb/ Red cell count is back in Normal Range   But does appear low in Iron , So suggest take an OTC iron pill &    - Eat more Veggies with Iron as Carrots, Beets , all leafy green veggies    as Spinach, Collards,  Turnip - Mustard or Mixed Greens, Kale,   Asparagus, Broccoli, Brussel Sprouts, Green Beans / peas,   Soybeans, Lentils, Sweet Potatoes <><><><><><><><><><><><><><><><><><><><><><><><><><><><><><><><><> <><><><><><><><><><><><><><><><><><><><><><><><><><><><><><><><><>  -  A1c 6.8% still in Diabetic range, So . . . . Marland Kitchen                                            - sent 3 mo /90 day Rx for Metformin to [facility] new Rx to [facility] / [place] for Trulicity - 4 pens . . . Marland Kitchen                                                                to inject into abdomen 1 x /week for 4 weeks   - Then call to increase dose for the next month  <><><><><><><><><><><><><><><><><><><><><><><><><><><><><><><><><> <><><><><><><><><><><><><><><><><><><><><><><><><><><><><><><><><>  - Uric Acid / gout test is normal  <><><><><><><><><><><><><><><><><><><><><><><><><><><><><><><><><>  -  Total Chol = 174     &   LDL Chol = 71 -- Both     Excellent   - Very low risk for Heart Attack  / Stroke <><><><><><><><><><><><><><><><><><><><><><><><><><><><><><><><><>  -   Vitamin D - Normal - Please   continue dose same ============================================================ ============================================================  -    All Else - CBC - Kidneys - Electrolytes - Liver - Magnesium & Thyroid    -    All  Normal /  OK ===========================================================

## 2021-08-20 ENCOUNTER — Other Ambulatory Visit: Payer: Self-pay | Admitting: Internal Medicine

## 2021-08-20 DIAGNOSIS — K219 Gastro-esophageal reflux disease without esophagitis: Secondary | ICD-10-CM

## 2021-08-24 ENCOUNTER — Other Ambulatory Visit: Payer: Self-pay | Admitting: Internal Medicine

## 2021-08-24 DIAGNOSIS — Z1231 Encounter for screening mammogram for malignant neoplasm of breast: Secondary | ICD-10-CM

## 2021-09-02 ENCOUNTER — Other Ambulatory Visit: Payer: Self-pay

## 2021-09-02 DIAGNOSIS — Z1211 Encounter for screening for malignant neoplasm of colon: Secondary | ICD-10-CM

## 2021-09-02 LAB — POC HEMOCCULT BLD/STL (HOME/3-CARD/SCREEN)
Card #2 Fecal Occult Blod, POC: NEGATIVE
Card #3 Fecal Occult Blood, POC: NEGATIVE
Fecal Occult Blood, POC: NEGATIVE

## 2021-09-06 DIAGNOSIS — Z1211 Encounter for screening for malignant neoplasm of colon: Secondary | ICD-10-CM | POA: Diagnosis not present

## 2021-09-06 DIAGNOSIS — Z1212 Encounter for screening for malignant neoplasm of rectum: Secondary | ICD-10-CM | POA: Diagnosis not present

## 2021-09-16 ENCOUNTER — Ambulatory Visit
Admission: RE | Admit: 2021-09-16 | Discharge: 2021-09-16 | Disposition: A | Discharge status: Home / Self Care | Payer: Medicare Other | Source: Ambulatory Visit | Attending: Internal Medicine | Admitting: Internal Medicine

## 2021-09-16 DIAGNOSIS — Z1231 Encounter for screening mammogram for malignant neoplasm of breast: Secondary | ICD-10-CM | POA: Diagnosis not present

## 2021-10-13 ENCOUNTER — Other Ambulatory Visit: Payer: Self-pay | Admitting: Internal Medicine

## 2021-10-29 ENCOUNTER — Telehealth: Payer: Self-pay | Admitting: Nurse Practitioner

## 2021-10-29 NOTE — Telephone Encounter (Signed)
PT NEED A RX CALLED IN TO PHARM AT [place] FOR TEST STRIPS CAUSE SHE IS OUT AND DR WDM WAS TO HAVE CALLED IT IN TO HER MAIL ORDER FROM LAST WK TO [facility] AND THEY HAVE NO ORDER FOR IT SO THIS NEEDS TO BE DONE TOO, PT PHONE NUMBER [phone] OR [phone].

## 2021-11-02 ENCOUNTER — Other Ambulatory Visit: Payer: Self-pay

## 2021-11-02 DIAGNOSIS — E1122 Type 2 diabetes mellitus with diabetic chronic kidney disease: Secondary | ICD-10-CM

## 2021-11-02 DIAGNOSIS — H401131 Primary open-angle glaucoma, bilateral, mild stage: Secondary | ICD-10-CM | POA: Diagnosis not present

## 2021-11-02 MED ORDER — GLUCOSE BLOOD VI STRP: ORAL_STRIP | 3 refills | Status: DC

## 2021-11-08 ENCOUNTER — Encounter: Payer: Self-pay | Admitting: Nurse Practitioner

## 2021-11-08 ENCOUNTER — Ambulatory Visit (INDEPENDENT_AMBULATORY_CARE_PROVIDER_SITE_OTHER): Payer: Medicare Other | Admitting: Nurse Practitioner

## 2021-11-08 VITALS — BP 110/70 | HR 54 | Temp 97.5°F | Ht 67.5 in | Wt 210.0 lb

## 2021-11-08 DIAGNOSIS — H409 Unspecified glaucoma: Secondary | ICD-10-CM

## 2021-11-08 DIAGNOSIS — Z79899 Other long term (current) drug therapy: Secondary | ICD-10-CM

## 2021-11-08 DIAGNOSIS — M1990 Unspecified osteoarthritis, unspecified site: Secondary | ICD-10-CM

## 2021-11-08 DIAGNOSIS — E1122 Type 2 diabetes mellitus with diabetic chronic kidney disease: Secondary | ICD-10-CM | POA: Diagnosis not present

## 2021-11-08 DIAGNOSIS — E119 Type 2 diabetes mellitus without complications: Secondary | ICD-10-CM | POA: Diagnosis not present

## 2021-11-08 DIAGNOSIS — E1142 Type 2 diabetes mellitus with diabetic polyneuropathy: Secondary | ICD-10-CM

## 2021-11-08 DIAGNOSIS — E1169 Type 2 diabetes mellitus with other specified complication: Secondary | ICD-10-CM

## 2021-11-08 DIAGNOSIS — E039 Hypothyroidism, unspecified: Secondary | ICD-10-CM | POA: Diagnosis not present

## 2021-11-08 DIAGNOSIS — N951 Menopausal and female climacteric states: Secondary | ICD-10-CM

## 2021-11-08 DIAGNOSIS — N183 Chronic kidney disease, stage 3 unspecified: Secondary | ICD-10-CM

## 2021-11-08 DIAGNOSIS — E559 Vitamin D deficiency, unspecified: Secondary | ICD-10-CM | POA: Diagnosis not present

## 2021-11-08 DIAGNOSIS — N182 Chronic kidney disease, stage 2 (mild): Secondary | ICD-10-CM

## 2021-11-08 DIAGNOSIS — K219 Gastro-esophageal reflux disease without esophagitis: Secondary | ICD-10-CM | POA: Diagnosis not present

## 2021-11-08 DIAGNOSIS — E785 Hyperlipidemia, unspecified: Secondary | ICD-10-CM

## 2021-11-08 DIAGNOSIS — N3281 Overactive bladder: Secondary | ICD-10-CM

## 2021-11-08 DIAGNOSIS — I10 Essential (primary) hypertension: Secondary | ICD-10-CM

## 2021-11-08 MED ORDER — BLOOD GLUCOSE MONITOR KIT: PACK | 0 refills | Status: DC

## 2021-11-08 MED ORDER — GLUCOSE BLOOD VI STRP: ORAL_STRIP | 3 refills | Status: AC

## 2021-11-08 MED ORDER — LANCETS MISC: 11 refills | Status: AC

## 2021-11-08 MED ORDER — PHENTERMINE HCL 37.5 MG PO TABS: ORAL_TABLET | ORAL | 1 refills | Status: DC

## 2021-11-08 MED ORDER — ESTRADIOL 0.025 MG/24HR TD PTWK: MEDICATED_PATCH | TRANSDERMAL | 3 refills | Status: DC

## 2021-11-08 NOTE — Progress Notes (Signed)
FOLLOW UP  Assessment:   Essential hypertension Discussed DASH (Dietary Approaches to Stop Hypertension) DASH diet is lower in sodium than a typical American diet. Cut back on foods that are high in saturated fat, cholesterol, and trans fats. Eat more whole-grain foods, fish, poultry, and nuts Remain active and exercise as tolerated daily.  Monitor BP at home-Call if greater than 130/80.  Check CMP/CBC   Hyperlipidemia associated with T2DM (HCC) Discussed lifestyle modifications. Recommended diet heavy in fruits and veggies, omega 3's. Decrease consumption of animal meats, cheeses, and dairy products. Remain active and exercise as tolerated. Continue to monitor. Check lipids/TSH   Gastroesophageal reflux disease, esophagitis presence not specified No suspected reflux complications ([place]/stricture). Lifestyle modification:  wt loss, avoid meals 2-3h before bedtime. Consider eliminating food triggers:  chocolate, caffeine, EtOH, acid/spicy food.    Diet controlled T2DM [facility])  Education: Reviewed 'ABCs' of diabetes management (respective goals in parentheses):  A1C (<7), blood pressure (<130/80), and cholesterol (LDL <70) Eye Exam yearly and Dental Exam every 6 months.  Dietary recommendations Physical Activity recommendations Check and monitor A1c  CKD II associated with T2DM ([place]) Discussed how what you eat and drink can aide in kidney protection. Stay well hydrated. Avoid high salt foods. Avoid NSAIDS. Keep BP and BG well controlled.   Take medications as prescribed. Remain active and exercise as tolerated daily. Maintain weight.  Continue to monitor. Check CMP/GFR/Microablumin   Hypothyroidism, unspecified type Controlled. Continue Levothyroxine. Reminded to take on an empty stomach 30-57mns before food.  Stop any Biotin Supplement 48-72 hours before next TSH level to reduce the risk of falsely low TSH levels. Continue to monitor.     Peripheral sensory  neuropathy, diabetic (HCC) Control glucose, do daily food checks Gabapentin PRN is working well   Osteoarthritis, unspecified osteoarthritis type, unspecified site S/P TKR Right. Followed by Dr. RMayer Camel Morbid obesity - BMI 346with diabetes, htn, hyperlipidemia Discussed appropriate BMI Goal of losing 1 lb per month. Diet modification. Physical activity. Encouraged/praised to build confidence.   Vitamin D deficiency Continue supplement Monitor levels  Medication management All medications discussed and reviewed in full. All questions and concerns regarding medications addressed.    Glaucoma, unspecified glaucoma type, unspecified laterality Continue eye doctor follow up   Urinary frequency/ OAB Possible referral for pelvic floor PT was discussed and interested Discussed meds but will hold off for now per patient preference  1. Primary hypertension Discussed DASH (Dietary Approaches to Stop Hypertension) DASH diet is lower in sodium than a typical American diet. Cut back on foods that are high in saturated fat, cholesterol, and trans fats. Eat more whole-grain foods, fish, poultry, and nuts Remain active and exercise as tolerated daily.  Monitor BP at home-Call if greater than 130/80.  Check CMP/CBC  - CBC with Differential/Platelet - COMPLETE METABOLIC PANEL WITH GFR  2. Hyperlipidemia associated with type 2 diabetes mellitus ([place] Discussed lifestyle modifications. Recommended diet heavy in fruits and veggies, omega 3's. Decrease consumption of animal meats, cheeses, and dairy products. Remain active and exercise as tolerated. Continue to monitor. Check lipids/TSH  - TSH - Lipid panel  3. Gastroesophageal reflux disease, unspecified whether esophagitis present Continue Prevacid PRN. No suspected reflux complications ([place]/stricture). Lifestyle modification:  wt loss, avoid meals 2-3h before bedtime. Consider eliminating food triggers:  chocolate, caffeine,  EtOH, acid/spicy food.   4. Diet-controlled diabetes mellitus ([facility] Education: Reviewed 'ABCs' of diabetes management  Discussed goals to be met and/or maintained include A1C (<7) Blood pressure (<130/80)  Cholesterol (LDL <70) Continue Eye Exam yearly  Continue Dental Exam Q6 mo Discussed dietary recommendations Discussed Physical Activity recommendations Foot exam UTD Check A1C  - Hemoglobin A1c  5. CKD stage 2 due to type 2 diabetes mellitus ([place]) Discussed how what you eat and drink can aide in kidney protection. Stay well hydrated. Avoid high salt foods. Avoid NSAIDS. Keep BP and BG well controlled.   Take medications as prescribed. Remain active and exercise as tolerated daily. Maintain weight.  Continue to monitor. Check CMP/GFR/Microablumin  - COMPLETE METABOLIC PANEL WITH GFR  6. Hypothyroidism, unspecified type Controlled. Continue Levothyroxine. Reminded to take on an empty stomach 30-49mns before food.  Stop any Biotin Supplement 48-72 hours before next TSH level to reduce the risk of falsely low TSH levels. Continue to monitor.    - TSH  7. Diabetic peripheral neuropathy (HCC) Continue to inspect feet daily.  8. Osteoarthritis, unspecified osteoarthritis type, unspecified site RICE when flared. Stay well hydrated.  9. Morbid obesity ([place] BMI 35 associated with htn, hyperlipidemia, arthritis, T2DM Discussed appropriate BMI Goal of losing 1 lb per month. Diet modification. Physical activity. Encouraged/praised to build confidence.  - COMPLETE METABOLIC PANEL WITH GFR - phentermine (ADIPEX-P) 37.5 MG tablet; Take 1/2 to 1 tablet every Morning for Dieting & Weight Loss  Dispense: 90 tablet; Refill: 1  10. Vitamin D deficiency  - VITAMIN D 25 Hydroxy (Vit-D Deficiency, Fractures)  11. Medication management All medications discussed and reviewed in full. All questions and concerns regarding medications addressed.    - CBC with  Differential/Platelet - COMPLETE METABOLIC PANEL WITH GFR - TSH - Lipid panel - VITAMIN D 25 Hydroxy (Vit-D Deficiency, Fractures) - Hemoglobin A1c  12. Glaucoma, unspecified glaucoma type, unspecified laterality Continue following up for eye exam  13. OAB (overactive bladder) Continue to monitor  14. Hot flashes due to menopause  - estradiol (CLIMARA - DOSED IN MG/24 HR) 0.025 mg/24hr patch; Place  1 patch  on the Skin  every 7 days  Dispense: 13 patch; Refill: 3  15. Controlled type 2 diabetes mellitus with stage 2 chronic kidney disease, without long-term current use of insulin ([facility] Education: Reviewed 'ABCs' of diabetes management  Discussed goals to be met and/or maintained include A1C (<7) Blood pressure (<130/80) Cholesterol (LDL <70) Continue Eye Exam yearly  Continue Dental Exam Q6 mo Discussed dietary recommendations Discussed Physical Activity recommendations Foot exam UTD Check A1C  - blood glucose meter kit and supplies KIT; Test blood sugar once daily.  Dispense: 1 each; Refill: 0 - Lancets MISC; Test blood sugar once daily.  Dispense: 200 each; Refill: 11  16. Controlled type 2 diabetes mellitus with stage 3 chronic kidney disease, without long-term current use of insulin ([place] Discussed how what you eat and drink can aide in kidney protection. Stay well hydrated. Avoid high salt foods. Avoid NSAIDS. Keep BP and BG well controlled.   Take medications as prescribed. Remain active and exercise as tolerated daily. Maintain weight.  Continue to monitor. Check CMP/GFR/Microablumin  - glucose blood test strip; Check blood sugar once daily  Dispense: 100 each; Refill: 3    Future Appointments  Date Time Provider DEmporia 02/10/2022  2:30 PM MUnk Pinto MD [facility] None  04/08/2022 11:00 AM CDarrol Jump NP [facility] None  08/10/2022 11:00 AM MUnk Pinto MD [facility] None     Subjective:   Rebecca WILTSEYis a 81y.o. female  who presents for Medicare Annual Wellness Visit and 3 month follow  up on HTN, T2 diabetes with CKD and neuropathy, hyperlipidemia, vitamin D def. 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   GERD- managing at this time, taking omeprazole daily.  Hx hysterectomy and bladder suspension remotely, no recent pelvic, she notes in the last 2-3 years has noted increased urinary frequency, some urgency with leaking bladder, urinates every hour or so, gets up 3-4 times a night.   BMI is Body mass index is 32.41 kg/m., she has not been working on diet.  Wt Readings from Last 3 Encounters:  11/08/21 210 lb (95.3 kg)  08/05/21 220 lb 12.8 oz (100.2 kg)  04/19/21 224 lb 13.9 oz (102 kg)   Her blood pressure has been controlled at home, today their BP is BP: 110/70 She does workout occasionally goes swimming. She denies chest pain, shortness of breath, dizziness.   She is on cholesterol medication (on rosuvastatin 5 mg three days a week) and denies myalgias. Her cholesterol is at goal. The cholesterol last visit was:   Lab Results  Component Value Date   CHOL 174 08/05/2021   HDL 87 08/05/2021   LDLCALC 71 08/05/2021   TRIG 81 08/05/2021   CHOLHDL 2.0 08/05/2021   She has been working on diet and exercise for Diet controlled T2DM with CKD 2 and neuropathy.   Last A1C in the office was:  Lab Results  Component Value Date   HGBA1C 6.8 (H) 08/05/2021    She has been pushing fluid intake:   Lab Results  Component Value Date   GFRAA 80 07/09/2020   Patient is on Vitamin D supplement and at goal at recent check:  Lab Results  Component Value Date   VD25OH 67 08/05/2021     She is on thyroid medication. Her medication was not changed last visit, taking 100 mcg tab, 1 tab daily on empty stomach. Patient denies nervousness, palpitations and weight changes.  Lab Results  Component Value Date   TSH 1.25 08/05/2021     Medication Review Current Outpatient Medications on File Prior to Visit  Medication Sig Dispense Refill   acetaminophen (TYLENOL) 500 MG tablet Take 1,000 mg by mouth every 6 (six) hours as needed for moderate pain.     Alcohol Swabs (B-D SINGLE USE SWABS REGULAR) PADS Use 1 swab prior to checking blood sugar daily-DX-E11.9 100 each 3   aspirin EC 81 MG tablet Take 1 tablet (81 mg total) by mouth 2 (two) times daily. 60 tablet 0   Biotin w/ Vitamins C & E (HAIR SKIN & NAILS GUMMIES PO) Take 1 tablet by mouth in the morning and at bedtime.     bisoprolol-hydrochlorothiazide (ZIAC) 10-6.25 MG tablet TAKE 1 TABLET BY MOUTH  DAILY FOR BLOOD PRESSURE 100 tablet 2   Blood Glucose Calibration (TRUE METRIX LEVEL 1) Low SOLN Check blood sugar 1 time daily-DX-E11.9. 3 each 3   Blood Glucose Monitoring Suppl (TRUE METRIX METER) w/Device KIT Check blood sugar 1 time daily-DX-E11.9 1 kit 0   Cholecalciferol (VITAMIN D) 50 MCG (2000 UT) tablet Take 4,000 Units by mouth daily.  docusate sodium (COLACE) 100 MG capsule Take 100 mg by mouth 2 (two) times daily.     estradiol (CLIMARA - DOSED IN MG/24 HR) 0.025 mg/24hr patch Place  1 patch  on the Skin  every 7 days 13 patch 3   furosemide (LASIX) 40 MG tablet TAKE 1 TABLET BY MOUTH  DAILY FOR BLOOD PRESSURE  AND FLUID RETENTION Hyman Hopes  SWELLING 90 tablet 3   gabapentin (NEURONTIN) 600 MG tablet TAKE 1/2 TO 1 TABLET BY  MOUTH 2 TO 3 TIMES DAILY AS NEEDED FOR PAIN 270 tablet 3   glucose  blood test strip Check blood sugar 1 time a day-DX-E11.9. 100 each 3   Lancets 28G MISC Check blood sugar 1 time a day-DX-E11.9 100 each 3   latanoprost (XALATAN) 0.005 % ophthalmic solution Place 1 drop into both eyes at bedtime.     levothyroxine (SYNTHROID) 100 MCG tablet TAKE 1 TAB DAILY ON AN  EMPTY STOMACH WITH WATER  FOR 30 MINS, NO ANTACID  MEDS, CALCIUM OR MAGNESIUM  FOR 4 HRS AVOID BIOTIN 100 tablet 2   magnesium oxide (MAG-OX) 400 MG tablet Take 400 mg by mouth 2 (two) times daily.     metFORMIN (GLUCOPHAGE-XR) 500 MG 24 hr tablet Take 2 tablets 2 x/ day with meals  for Diabetes 360 tablet 3   omeprazole (PRILOSEC) 20 MG capsule TAKE 1 CAPSULE BY MOUTH  TWICE DAILY (EVERY 12  HOURS) TO PREVENT HEARTBURN AND INDIGESTION 180 capsule 3   phentermine (ADIPEX-P) 37.5 MG tablet Take 1/2 to 1 tablet every Morning for Dieting & Weight Loss 90 tablet 1   rosuvastatin (CRESTOR) 5 MG tablet TAKE 1 TABLET BY MOUTH 3  TIMES WEEKLY ON MONDAY  WEDNESDAY AND FRIDAY FOR  CHOLESTEROL 39 tablet 2   tiZANidine (ZANAFLEX) 2 MG tablet Take 1 tablet (2 mg total) by mouth every 6 (six) hours as needed. 60 tablet 0   topiramate (TOPAMAX) 50 MG tablet TAKE 1 TO 2 TABLETS BY MOUTH AT  BEDTIME FOR DIETING &amp; WEIGHT  LOSS 200 tablet 2   Trypsin-Castor Oil-[place] Balsam (OPTASE EX) Place 1 application into both eyes daily.     vitamin B-12 (CYANOCOBALAMIN) 1000 MCG tablet Take 1,000 mcg by mouth daily.     Dulaglutide (TRULICITY) 3.88 EK/8.0KL SOPN Inject 0.75 mg into Abdomen each week for 4 week,  then call to increase  dose for Diabetes ( Dx: e11.29 ) (Patient not taking: Reported on 11/08/2021) 2 mL 0   No current facility-administered medications on file prior to visit.    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 ([place]) 04/19/2021   History of revision of total replacement of right hip joint 04/19/2021   OAB  (overactive bladder) 04/08/2021   Diet-controlled diabetes mellitus ([place]) 06/19/2018   Insomnia 10/26/2017   Diabetic peripheral neuropathy ([place]) 01/18/2015   CKD stage 2 due to type 2 diabetes mellitus ([place]) 01/18/2015   Morbid obesity ([place]) BMI 35 associated with htn, hyperlipidemia, arthritis, T2DM 07/03/2014   Vitamin D deficiency 03/31/2013   Medication management 03/31/2013   DJD    Glaucoma    Hypothyroidism    Hypertension    Hyperlipidemia associated with type 2 diabetes mellitus ([place])    GERD (gastroesophageal reflux disease)     Screening Tests Immunization History  Administered Date(s) Administered   Influenza, High Dose Seasonal PF 11/25/2016, 11/24/2017, 10/17/2018, 11/14/2018, 11/28/2020   Influenza-Unspecified  12/09/2013, 11/14/2015, 11/23/2016   PFIZER Comirnaty(Gray Top)Covid-19 Tri-Sucrose Vaccine 01/27/2021   PFIZER(Purple Top)SARS-COV-2 Vaccination 05/12/2019, 06/01/2019, 12/16/2019, 06/08/2020   Pneumococcal Conjugate-13 04/21/2015   Pneumococcal Polysaccharide-23 05/29/2008   Td 04/26/2007   Health Maintenance  Topic Date Due   Zoster Vaccines- Shingrix (1 of 2) Never done   OPHTHALMOLOGY EXAM  04/13/2018   COVID-19 Vaccine (6 - Pfizer risk series) 03/24/2021   INFLUENZA VACCINE  09/28/2021   TETANUS/TDAP  04/08/2022 (Originally 04/25/2017)   HEMOGLOBIN A1C  02/04/2022   FOOT EXAM  08/05/2022   MAMMOGRAM  09/17/2022   Pneumonia Vaccine 22+ Years old  Completed   DEXA SCAN  Completed   HPV VACCINES  Aged Out    Last colonoscopy: Dr. Fuller Plan on 11/2017, polyp, no recall due to age  Last pap smear/pelvic exam: remote, declines further Last mammogram: gets annually at [facility] center DEXA: 2017, normal, discussed follow up, ordered to schedule, phone number given   TD or Tdap: 2009, declines due to cost, boost PRN Shingles/Zostavax: declines due to price  Names of Other Physician/Practitioners you currently use: 1. [facility]- here for primary care 2. Dr. Peter Garter, eye doctor,has appointment coming up, visits every 3-4 months for [place], last 2023 report requested 3. Can't remember name, dentist, last visit 2017, has partials, encouraged to schedule follow up  Patient Care Team: Unk Pinto, MD as PCP - General (Internal Medicine) Camillo Flaming, [place] as Referring Physician (Optometry) Suella Broad, MD as Consulting Physician (Physical Medicine and Rehabilitation) Ladene Artist, MD as Consulting Physician (Gastroenterology) Myrlene Broker, MD as Attending Physician (Urology)   Allergies Allergies  Allergen Reactions   Meloxicam Swelling    Weight gain    SURGICAL HISTORY She  has a past surgical history that includes Abdominal hysterectomy (1988); Appendectomy; Joint replacement (2007); Bladder suspension; Knee arthroscopy (03/28/2011); Colonoscopy; right knee srthroplasty; and Total hip revision (Right, 04/19/2021). FAMILY HISTORY Her family history includes Colon polyps in her sister; Diabetes in her sister; Esophageal cancer in some other family members. SOCIAL HISTORY She  reports that she quit smoking about 25 years ago. Her smoking use included cigarettes. She has never used smokeless tobacco. She reports current alcohol use of about 2.0 standard drinks of alcohol per week. She reports that she does not use drugs.    Objective:   Blood pressure 110/70, pulse (!) 54, temperature (!) 97.5 F (36.4 C), height 5' 7.5" (1.715 m), weight 210 lb (95.3 kg), SpO2 98 %. Body mass index is 32.41 kg/m.  General appearance: alert, no distress, WD/WN,  female HEENT: normocephalic, sclerae anicteric, TMs pearly, nares patent, no discharge or erythema, pharynx normal Oral cavity: MMM, no lesions Neck: supple, no lymphadenopathy, no thyromegaly, no masses Heart: RRR, normal S1, S2, no murmurs Lungs: CTA bilaterally, no wheezes, rhonchi, or rales Abdomen: +bs, soft, non tender, non  distended, no masses, no hepatomegaly, no splenomegaly Musculoskeletal: nontender, antalgic gait with reduced R hip ROM Extremities: no edema, no cyanosis, no clubbing Pulses: 2+ symmetric, upper and lower extremities, normal cap refill Neurological: alert, oriented x 3, CN2-12 intact, strength normal upper extremities and lower extremities, sensation diminished bil feet to monofilament, DTRs 2+ throughout, no cerebellar signs, gait antalgic but steady  Psychiatric: normal affect, behavior normal, pleasant  Breast: defer Gyn: defer Rectal: defer   Darrol Jump, NP

## 2021-11-08 NOTE — Patient Instructions (Signed)

## 2021-11-09 LAB — CBC WITH DIFFERENTIAL/PLATELET
Absolute Monocytes: 527 cells/uL (ref 200–950)
Basophils Absolute: 32 cells/uL (ref 0–200)
Basophils Relative: 0.7 %
Eosinophils Absolute: 122 cells/uL (ref 15–500)
Eosinophils Relative: 2.7 %
HCT: 42.2 % (ref 35.0–45.0)
Hemoglobin: 14 g/dL (ref 11.7–15.5)
Lymphs Abs: 2097 cells/uL (ref 850–3900)
MCH: 26.6 pg — ABNORMAL LOW (ref 27.0–33.0)
MCHC: 33.2 g/dL (ref 32.0–36.0)
MCV: 80.2 fL (ref 80.0–100.0)
MPV: 9.9 fL (ref 7.5–12.5)
Monocytes Relative: 11.7 %
Neutro Abs: 1724 cells/uL (ref 1500–7800)
Neutrophils Relative %: 38.3 %
Platelets: 257 10*3/uL (ref 140–400)
RBC: 5.26 10*6/uL — ABNORMAL HIGH (ref 3.80–5.10)
RDW: 15.1 % — ABNORMAL HIGH (ref 11.0–15.0)
Total Lymphocyte: 46.6 %
WBC: 4.5 10*3/uL (ref 3.8–10.8)

## 2021-11-09 LAB — COMPLETE METABOLIC PANEL WITH GFR
AG Ratio: 1.7 (calc) (ref 1.0–2.5)
ALT: 10 U/L (ref 6–29)
AST: 12 U/L (ref 10–35)
Albumin: 4.7 g/dL (ref 3.6–5.1)
Alkaline phosphatase (APISO): 72 U/L (ref 37–153)
BUN/Creatinine Ratio: 13 (calc) (ref 6–22)
BUN: 13 mg/dL (ref 7–25)
CO2: 31 mmol/L (ref 20–32)
Calcium: 11.6 mg/dL — ABNORMAL HIGH (ref 8.6–10.4)
Chloride: 102 mmol/L (ref 98–110)
Creat: 1.03 mg/dL — ABNORMAL HIGH (ref 0.60–0.95)
Globulin: 2.7 g/dL (calc) (ref 1.9–3.7)
Glucose, Bld: 93 mg/dL (ref 65–99)
Potassium: 4.8 mmol/L (ref 3.5–5.3)
Sodium: 143 mmol/L (ref 135–146)
Total Bilirubin: 0.6 mg/dL (ref 0.2–1.2)
Total Protein: 7.4 g/dL (ref 6.1–8.1)
eGFR: 55 mL/min/{1.73_m2} — ABNORMAL LOW (ref >=60)

## 2021-11-09 LAB — LIPID PANEL
Cholesterol: 183 mg/dL (ref <200)
HDL: 73 mg/dL (ref >=50)
LDL Cholesterol (Calc): 80 mg/dL (calc)
Non-HDL Cholesterol (Calc): 110 mg/dL (calc) (ref <130)
Total CHOL/HDL Ratio: 2.5 (calc) (ref <5.0)
Triglycerides: 204 mg/dL — ABNORMAL HIGH (ref <150)

## 2021-11-09 LAB — HEMOGLOBIN A1C
Hgb A1c MFr Bld: 6 % of total Hgb — ABNORMAL HIGH (ref <5.7)
Mean Plasma Glucose: 126 mg/dL
eAG (mmol/L): 7 mmol/L

## 2021-11-09 LAB — TSH: TSH: 0.77 mIU/L (ref 0.40–4.50)

## 2021-11-09 LAB — VITAMIN D 25 HYDROXY (VIT D DEFICIENCY, FRACTURES): Vit D, 25-Hydroxy: 74 ng/mL (ref 30–100)

## 2022-01-02 ENCOUNTER — Other Ambulatory Visit: Payer: Self-pay | Admitting: Nurse Practitioner

## 2022-01-02 DIAGNOSIS — E1169 Type 2 diabetes mellitus with other specified complication: Secondary | ICD-10-CM

## 2022-01-23 ENCOUNTER — Other Ambulatory Visit: Payer: Self-pay | Admitting: Internal Medicine

## 2022-01-23 DIAGNOSIS — E1122 Type 2 diabetes mellitus with diabetic chronic kidney disease: Secondary | ICD-10-CM

## 2022-01-23 MED ORDER — BLOOD GLUCOSE MONITOR KIT: PACK | 0 refills | Status: AC

## 2022-02-03 DIAGNOSIS — H43822 Vitreomacular adhesion, left eye: Secondary | ICD-10-CM | POA: Diagnosis not present

## 2022-02-03 DIAGNOSIS — H401131 Primary open-angle glaucoma, bilateral, mild stage: Secondary | ICD-10-CM | POA: Diagnosis not present

## 2022-02-03 DIAGNOSIS — H52223 Regular astigmatism, bilateral: Secondary | ICD-10-CM | POA: Diagnosis not present

## 2022-02-03 DIAGNOSIS — H25013 Cortical age-related cataract, bilateral: Secondary | ICD-10-CM | POA: Diagnosis not present

## 2022-02-03 DIAGNOSIS — E119 Type 2 diabetes mellitus without complications: Secondary | ICD-10-CM | POA: Diagnosis not present

## 2022-02-03 DIAGNOSIS — H5203 Hypermetropia, bilateral: Secondary | ICD-10-CM | POA: Diagnosis not present

## 2022-02-03 DIAGNOSIS — H524 Presbyopia: Secondary | ICD-10-CM | POA: Diagnosis not present

## 2022-02-03 LAB — HM DIABETES EYE EXAM

## 2022-02-10 ENCOUNTER — Ambulatory Visit: Payer: Medicare Other | Admitting: Internal Medicine

## 2022-02-13 ENCOUNTER — Encounter: Payer: Self-pay | Admitting: Internal Medicine

## 2022-02-13 NOTE — Progress Notes (Unsigned)
Future Appointments  Date Time Provider Department  02/14/2022                  6 mo ov 11:30 AM Unk Pinto, MD [facility]  04/08/2022                     wellness 11:00 AM Darrol Jump, NP [facility]  08/10/2022                       cpe 11:00 AM Unk Pinto, MD [facility]    History of Present Illness:       This very nice 81 y.o. DBF  presents for 6 month follow up with HTN, HLD, T2_NIDDM, Hypothyroidism   and Vitamin D Deficiency.        Patient is treated for HTN  (1997)  & BP has been controlled at home. Today's BP is at goal -  124/70. Patient has had no complaints of any cardiac type chest pain, palpitations, dyspnea / orthopnea / PND, dizziness, claudication, or dependent edema.       Hyperlipidemia is controlled with diet & meds. Patient denies myalgias or other med SE's. Last Lipids were  Lab Results  Component Value Date   CHOL 183 11/08/2021   HDL 73 11/08/2021   LDLCALC 80 11/08/2021   TRIG 204 (H) 11/08/2021   CHOLHDL 2.5 11/08/2021     Also, the patient has history Moderate Obesity  (BMI 35+) of T2_NIDDM (2013)w/CKD3a (GFR 58) & PSN.  Patient has had no symptoms of reactive hypoglycemia, diabetic polys, paresthesias or visual blurring.  Last A1c was not at goal :  Lab Results  Component Value Date   HGBA1C 6.0 (H) 11/08/2021                                                       Further, the patient also has history of Vitamin D Deficiency ("26" /2008 )  and supplements vitamin D . Last vitamin D was at  goal :  Lab Results  Component Value Date   VD25OH 74 11/08/2021     Current Outpatient Medications on File Prior to Visit  Medication Sig   acetaminophen (TYLENOL) 500 MG tablet Take 1,000 mg every 6 (six) hours as needed for moderate pain.   aspirin EC 81 MG tablet Take 1 tablet  2  times daily.   Biotin w/ Vitamins C & E (HAIR SKIN & NAILS GUMMIES PO) Take 1 tablet b in the morning and at bedtime.   bisoprolol-hydrochlorothiazide  (ZIAC) 10-6.25 MG tablet TAKE 1 TABLET   DAILY    VITAMIN D  2000 u Take 4,000 Units by mouth daily.   COLACE 100 MG capsule Take 100 mg by mouth 2 (two) times daily.      not taking    furosemide (LASIX) 40 MG tablet TAKE 1 TABLET BY MOUTH  DAILY    gabapentin (NEURONTIN) 600 MG tablet TAKE 1/2 TO 1 TAB  2 TO 3 TIMES DAILY AS NEEDED    glucose blood test strip Check blood sugar once daily   Lancets MISC Test blood sugar once daily.   XALATAN) 0.005 % ophth  soln Place 1 drop into both eyes at bedtime.   levothyroxine 100 MCG tablet TAKE 1 TAB DAILY  magnesium  400 MG tablet Take 2 (two) times daily.   metFORMIN -XR) 500 MG  Take 2 tablets 2 x/ day with meals  for Diabetes   omeprazole 20 MG capsule TAKE 1 CAPSULE BY MOUTH  TWICE DAILY    phentermine  37.5 MG tablet Take 1/2 to 1 tablet every Morning for Dieting & Weight Loss   rosuvastatin  5 MG tablet TAKE 1 TABLET 3 TIMES  WEEKLY   topiramate  50 MG tablet TAKE 1 TO 2 TABLETS AT  BEDTIME   Trypsin-Castor Oil-[place] Balsam (OPTASE EX) Place 1 application into both eyes daily.   vitamin B-12 (CYANOCOBALAMIN) 1000 MCG tablet Take 1,000 mcg by mouth daily.    PMHx:   Past Medical History:  Diagnosis Date   Anal fissure    Arthritis    Carotid stenosis, <50%, bilateral  12/20/2018   Colon polyps    hyperplastic   Diabetes mellitus without complication (HCC)    diet controlled   Diverticulosis    GERD (gastroesophageal reflux disease)    Glaucoma    Heart murmur    no problems   Hemorrhoids    Hyperlipidemia    Hypertension    Hypothyroidism    Neuromuscular disorder (HCC)    neuropathy   Tear of lateral meniscus of knee, current 03/27/2011     Immunization History  Administered Date(s) Administered   Influenza, High Dose  11/25/2016, 11/24/2017, 10/17/2018, 11/14/2018, 11/28/2020   Influenza 12/09/2013, 11/14/2015, 11/23/2016   PFIZER Covid-19 Vacc 01/27/2021   PFIZER-SARS-COV-2 Vacc 05/12/2019, 06/01/2019, 12/16/2019,  06/08/2020   Pneumococcal -13 04/21/2015   Pneumococcal -23 05/29/2008   Td 04/26/2007     Past Surgical History:  Procedure Laterality Date   ABDOMINAL HYSTERECTOMY  1988   In [place]  2007   rt hip replacement   KNEE ARTHROSCOPY  03/28/2011   Procedure: ARTHROSCOPY KNEE;  Surgeon: Kerin Salen, MD;  Location: [facility];  Service: Orthopedics;  Laterality: Right;  with partial lateral meniscectomy, Debridement of Chondromalacia   right knee srthroplasty     TOTAL HIP REVISION Right 04/19/2021   Procedure: RIGHT TOTAL HIP REVISION;  Surgeon: Frederik Pear, MD;  Location: [facility];  Service: Orthopedics;  Laterality: Right;    FHx:    Reviewed / unchanged  SHx:    Reviewed / unchanged   Systems Review:  Constitutional: Denies fever, chills, wt changes, headaches, insomnia, fatigue, night sweats, change in appetite. Eyes: Denies redness, blurred vision, diplopia, discharge, itchy, watery eyes.  ENT: Denies discharge, congestion, post nasal drip, epistaxis, sore throat, earache, hearing loss, dental pain, tinnitus, vertigo, sinus pain, snoring.  CV: Denies chest pain, palpitations, irregular heartbeat, syncope, dyspnea, diaphoresis, orthopnea, PND, claudication or edema. Respiratory: denies cough, dyspnea, DOE, pleurisy, hoarseness, laryngitis, wheezing.  Gastrointestinal: Denies dysphagia, odynophagia, heartburn, reflux, water brash, abdominal pain or cramps, nausea, vomiting, bloating, diarrhea, constipation, hematemesis, melena, hematochezia  or hemorrhoids. Genitourinary: Denies dysuria, frequency, urgency, nocturia, hesitancy, discharge, hematuria or flank pain. Musculoskeletal: Denies arthralgias, myalgias, stiffness, jt. swelling, pain, limping or strain/sprain.  Skin: Denies pruritus, rash, hives, warts, acne, eczema or change in skin lesion(s). Neuro: No weakness, tremor, incoordination,  spasms, paresthesia or pain. Psychiatric: Denies confusion, memory loss or sensory loss. Endo: Denies change in weight, skin or hair change.  Heme/Lymph: No excessive bleeding, bruising or enlarged lymph nodes.  Physical Exam  BP  124/70   Pulse 72   Temp 97.9 F (36.6 C)   Resp 16   Ht 5' 7.5" (1.715 m)   Wt 190 lb 3.2 oz (86.3 kg)   SpO2 99%   BMI 29.35 kg/m   Appears  well nourished, well groomed  and in no distress.  Eyes: PERRLA, EOMs, conjunctiva no swelling or erythema. Sinuses: No frontal/maxillary tenderness ENT/Mouth: EAC's clear, TM's nl w/o erythema, bulging. Nares clear w/o erythema, swelling, exudates. Oropharynx clear without erythema or exudates. Oral hygiene is good. Tongue normal, non obstructing. Hearing intact.  Neck: Supple. Thyroid not palpable. Car 2+/2+ without bruits, nodes or JVD. Chest: Respirations nl with BS clear & equal w/o rales, rhonchi, wheezing or stridor.  Cor: Heart sounds normal w/ regular rate and rhythm without sig. murmurs, gallops, clicks or rubs. Peripheral pulses normal and equal  without edema.  Abdomen: Soft & bowel sounds normal. Non-tender w/o guarding, rebound, hernias, masses or organomegaly.  Lymphatics: Unremarkable.  Musculoskeletal: Full ROM all peripheral extremities, joint stability, 5/5 strength and normal gait.  Skin: Warm, dry without exposed rashes, lesions or ecchymosis apparent.  Neuro: Cranial nerves intact, reflexes equal bilaterally. Sensory-motor testing grossly intact. Tendon reflexes grossly intact.  Pysch: Alert & oriented x 3.  Insight and judgement nl & appropriate. No ideations.  Assessment and Plan:  1. Morbid obesity (HCC) BMI 35 associated with htn, hyperlipidemia, arthritis, T2DM  - TSH  2. Essential hypertension  - Continue medication, monitor blood pressure at home.  - Continue DASH diet.  Reminder to go to the ER if any CP,  SOB, nausea, dizziness, severe HA, changes vision/speech.   - CBC with  Differential/Platelet - COMPLETE METABOLIC PANEL WITH GFR - Magnesium - TSH   3. Hyperlipidemia associated with type 2 diabetes mellitus ([place])  - Continue diet/meds, exercise,& lifestyle modifications.  - Continue monitor periodic cholesterol/liver & renal functions    - Lipid panel - TSH   4. Controlled type 2 diabetes mellitus with stage 3 chronic  kidney disease, without long-term current use of insulin (HCC)  - Continue diet, exercise  - Lifestyle modifications.  - Monitor appropriate labs  - Hemoglobin A1c - Insulin, random   5. Vitamin D deficiency  - Continue supplementation  - VITAMIN D 25 Hydroxy    6. Hypothyroidism  - TSH   7. Gout  - Uric acid   8. Medication management  - CBC with Differential/Platelet - COMPLETE METABOLIC PANEL WITH GFR - Magnesium - Lipid panel - TSH - Hemoglobin A1c - Insulin, random - VITAMIN D 25 Hydroxy - Uric acid         Discussed  regular exercise, BP monitoring, weight control to achieve/maintain BMI less than 25 and discussed med and SE's. Recommended labs to assess /monitor clinical status .  I discussed the assessment and treatment plan with the patient. The patient was provided an opportunity to ask questions and all were answered. The patient agreed with the plan and demonstrated an understanding of the instructions.  I provided over 30 minutes of exam, counseling, chart review and  complex critical decision making.        The patient was advised to call back or seek an in-person evaluation if the symptoms worsen or if the condition fails to improve as anticipated.   Kirtland Bouchard, MD

## 2022-02-13 NOTE — Patient Instructions (Signed)

## 2022-02-14 ENCOUNTER — Ambulatory Visit (INDEPENDENT_AMBULATORY_CARE_PROVIDER_SITE_OTHER): Payer: Medicare Other | Admitting: Internal Medicine

## 2022-02-14 ENCOUNTER — Encounter: Payer: Self-pay | Admitting: Internal Medicine

## 2022-02-14 DIAGNOSIS — E1169 Type 2 diabetes mellitus with other specified complication: Secondary | ICD-10-CM | POA: Diagnosis not present

## 2022-02-14 DIAGNOSIS — E1122 Type 2 diabetes mellitus with diabetic chronic kidney disease: Secondary | ICD-10-CM | POA: Diagnosis not present

## 2022-02-14 DIAGNOSIS — M109 Gout, unspecified: Secondary | ICD-10-CM | POA: Diagnosis not present

## 2022-02-14 DIAGNOSIS — E039 Hypothyroidism, unspecified: Secondary | ICD-10-CM | POA: Diagnosis not present

## 2022-02-14 DIAGNOSIS — E559 Vitamin D deficiency, unspecified: Secondary | ICD-10-CM | POA: Diagnosis not present

## 2022-02-14 DIAGNOSIS — N183 Chronic kidney disease, stage 3 unspecified: Secondary | ICD-10-CM | POA: Diagnosis not present

## 2022-02-14 DIAGNOSIS — I10 Essential (primary) hypertension: Secondary | ICD-10-CM | POA: Diagnosis not present

## 2022-02-14 DIAGNOSIS — E785 Hyperlipidemia, unspecified: Secondary | ICD-10-CM | POA: Diagnosis not present

## 2022-02-14 DIAGNOSIS — Z79899 Other long term (current) drug therapy: Secondary | ICD-10-CM | POA: Diagnosis not present

## 2022-02-14 DIAGNOSIS — N951 Menopausal and female climacteric states: Secondary | ICD-10-CM

## 2022-02-14 MED ORDER — ESTRADIOL 0.5 MG PO TABS: ORAL_TABLET | ORAL | 3 refills | Status: DC

## 2022-02-15 LAB — CBC WITH DIFFERENTIAL/PLATELET
Absolute Monocytes: 364 cells/uL (ref 200–950)
Basophils Absolute: 29 cells/uL (ref 0–200)
Basophils Relative: 0.8 %
Eosinophils Absolute: 79 cells/uL (ref 15–500)
Eosinophils Relative: 2.2 %
HCT: 39.1 % (ref 35.0–45.0)
Hemoglobin: 13.2 g/dL (ref 11.7–15.5)
Lymphs Abs: 1901 cells/uL (ref 850–3900)
MCH: 27.1 pg (ref 27.0–33.0)
MCHC: 33.8 g/dL (ref 32.0–36.0)
MCV: 80.3 fL (ref 80.0–100.0)
MPV: 10 fL (ref 7.5–12.5)
Monocytes Relative: 10.1 %
Neutro Abs: 1228 cells/uL — ABNORMAL LOW (ref 1500–7800)
Neutrophils Relative %: 34.1 %
Platelets: 263 10*3/uL (ref 140–400)
RBC: 4.87 10*6/uL (ref 3.80–5.10)
RDW: 14 % (ref 11.0–15.0)
Total Lymphocyte: 52.8 %
WBC: 3.6 10*3/uL — ABNORMAL LOW (ref 3.8–10.8)

## 2022-02-15 LAB — LIPID PANEL
Cholesterol: 157 mg/dL (ref <200)
HDL: 72 mg/dL (ref >=50)
LDL Cholesterol (Calc): 69 mg/dL (calc)
Non-HDL Cholesterol (Calc): 85 mg/dL (calc) (ref <130)
Total CHOL/HDL Ratio: 2.2 (calc) (ref <5.0)
Triglycerides: 79 mg/dL (ref <150)

## 2022-02-15 LAB — COMPLETE METABOLIC PANEL WITH GFR
AG Ratio: 1.7 (calc) (ref 1.0–2.5)
ALT: 10 U/L (ref 6–29)
AST: 13 U/L (ref 10–35)
Albumin: 4.3 g/dL (ref 3.6–5.1)
Alkaline phosphatase (APISO): 53 U/L (ref 37–153)
BUN: 11 mg/dL (ref 7–25)
CO2: 27 mmol/L (ref 20–32)
Calcium: 10.2 mg/dL (ref 8.6–10.4)
Chloride: 109 mmol/L (ref 98–110)
Creat: 0.81 mg/dL (ref 0.60–0.95)
Globulin: 2.5 g/dL (calc) (ref 1.9–3.7)
Glucose, Bld: 106 mg/dL — ABNORMAL HIGH (ref 65–99)
Potassium: 3.5 mmol/L (ref 3.5–5.3)
Sodium: 145 mmol/L (ref 135–146)
Total Bilirubin: 0.6 mg/dL (ref 0.2–1.2)
Total Protein: 6.8 g/dL (ref 6.1–8.1)
eGFR: 73 mL/min/{1.73_m2} (ref >=60)

## 2022-02-15 LAB — HEMOGLOBIN A1C
Hgb A1c MFr Bld: 6 % of total Hgb — ABNORMAL HIGH (ref <5.7)
Mean Plasma Glucose: 126 mg/dL
eAG (mmol/L): 7 mmol/L

## 2022-02-15 LAB — MAGNESIUM: Magnesium: 1.7 mg/dL (ref 1.5–2.5)

## 2022-02-15 LAB — VITAMIN D 25 HYDROXY (VIT D DEFICIENCY, FRACTURES): Vit D, 25-Hydroxy: 86 ng/mL (ref 30–100)

## 2022-02-15 LAB — URIC ACID: Uric Acid, Serum: 4.7 mg/dL (ref 2.5–7.0)

## 2022-02-15 LAB — TSH: TSH: 0.11 m[IU]/L — ABNORMAL LOW (ref 0.40–4.50)

## 2022-02-15 LAB — INSULIN, RANDOM: Insulin: 12.2 u[IU]/mL

## 2022-02-15 NOTE — Progress Notes (Signed)
<><><><><><><><><><><><><><><><><><><><><><><><><><><><><><><><><> <><><><><><><><><><><><><><><><><><><><><><><><><><><><><><><><><>  -   TSH level is very low , which means thyroid hormone is too high in blood  - So recommend cut your levothyroxine 100 mcg daily down to only 6 tablets  /week   - ie., take 1/2 tablet 2 x /week on Sundays & Wednesdays  &                                                                       take 1 whole tablet the other 5 days  <><><><><><><><><><><><><><><><><><><><><><><><><><><><><><><><><> <><><><><><><><><><><><><><><><><><><><><><><><><><><><><><><><><>  -  A1c better  - down to 6.0% from previous 6.8%    <><><><><><><><><><><><><><><><><><><><><><><><><><><><><><><><><> <><><><><><><><><><><><><><><><><><><><><><><><><><><><><><><><><>  -  Magnesium  = 1.7   is  very  low   - goal is betw 2.0 - 2.5,   - So..............Marland Kitchen  Recommend that you take                                                               Magnesium 500 mg tablet  up to 4 tablets  /day   - also important to eat lots of  leafy green vegetables   - spinach - Kale - collards - greens - okra - asparagus  - broccoli - quinoa - squash - almonds   - black, red, white beans   -  peas - green beans <><><><><><><><><><><><><><><><><><><><><><><><><><><><><><><><><> <><><><><><><><><><><><><><><><><><><><><><><><><><><><><><><><><>  - Total Chol = 157  & LDL Chol = 69  - Both  Excellent   !  - Very low risk for Heart Attack  / Stroke  <><><><><><><><><><><><><><><><><><><><><><><><><><><><><><><><><> <><><><><><><><><><><><><><><><><><><><><><><><><><><><><><><><><>  - Vitamin D= 86  - Excellent - Please  keep dose same  <><><><><><><><><><><><><><><><><><><><><><><><><><><><><><><><><> <><><><><><><><><><><><><><><><><><><><><><><><><><><><><><><><><>  - Uric Acid - Gout test is normal - Please continue [facility] Same   <><><><><><><><><><><><><><><><><><><><><><><><><><><><><><><><><> <><><><><><><><><><><><><><><><><><><><><><><><><><><><><><><><><>  - All Else - CBC - Kidneys - Electrolytes - Liver - Magnesium & Thyroid    - all  Normal / OK <><><><><><><><><><><><><><><><><><><><><><><><><><><><><><><><><> <><><><><><><><><><><><><><><><><><><><><><><><><><><><><><><><><>

## 2022-03-09 ENCOUNTER — Other Ambulatory Visit: Payer: Self-pay

## 2022-03-09 DIAGNOSIS — I10 Essential (primary) hypertension: Secondary | ICD-10-CM

## 2022-03-09 MED ORDER — FUROSEMIDE 40 MG PO TABS: ORAL_TABLET | ORAL | 3 refills | Status: DC

## 2022-03-17 DIAGNOSIS — Z0100 Encounter for examination of eyes and vision without abnormal findings: Secondary | ICD-10-CM | POA: Diagnosis not present

## 2022-03-22 ENCOUNTER — Encounter: Payer: Self-pay | Admitting: Internal Medicine

## 2022-04-08 ENCOUNTER — Ambulatory Visit: Payer: Medicare Other | Admitting: Nurse Practitioner

## 2022-04-11 ENCOUNTER — Ambulatory Visit: Payer: Medicare HMO | Admitting: Podiatry

## 2022-04-11 ENCOUNTER — Encounter: Payer: Self-pay | Admitting: Podiatry

## 2022-04-11 DIAGNOSIS — E1140 Type 2 diabetes mellitus with diabetic neuropathy, unspecified: Secondary | ICD-10-CM

## 2022-04-11 DIAGNOSIS — L600 Ingrowing nail: Secondary | ICD-10-CM

## 2022-04-11 DIAGNOSIS — E1149 Type 2 diabetes mellitus with other diabetic neurological complication: Secondary | ICD-10-CM | POA: Diagnosis not present

## 2022-04-11 NOTE — Progress Notes (Signed)
Subjective:   Patient ID: Rebecca Graves, female   DOB: 82 y.o.   MRN: [id number]   HPI Patient presents stating that she has had quite a bit of burning in both of her feet and ankles and that it seems worse at night been going on for couple years and she does have diabetes under good control with her A1c below 7.  Has an ingrown right third nail that is painful when pressed lateral border and she is tried to trim and soak it without relief and patient does not smoke likes to be active   Review of Systems  All other systems reviewed and are negative.       Objective:  Physical Exam Vitals and nursing note reviewed.  Constitutional:      Appearance: She is well-developed.  Pulmonary:     Effort: Pulmonary effort is normal.  Musculoskeletal:        General: Normal range of motion.  Skin:    General: Skin is warm.  Neurological:     Mental Status: She is alert.     Vascular status found to be intact with neurological revealing slight diminishment sharp dull vibratory.  Patient is found to have incurvated third nail right lateral border painful when pressed no active erythema edema or drainage just soreness and structural damage to the nailbed.  Patient is found to have good digital perfusion well-oriented x 3     Assessment:  Ingrown toenail deformity third right lateral border with pain along with moderate neuropathic pain bilateral secondary to long-term diabetes under good control     Plan:  H&P reviewed both conditions and do not see treatment currently for neuropathy but did discuss gabapentin or other treatments we could do and she will continue on the gabapentin dose she is taking twice daily.  I then discussed the ingrown toenail she wants it fixed I explained procedure risk she signed consent form I infiltrated the right big toe 60 mg like Marcaine mixture sterile prep done using sterile instrumentation removed the lateral border exposed matrix applied phenol 3 applications  30 seconds followed by alcohol lavage sterile dressing gave instructions on soaks leave dressing on 24 hours take it off earlier if throbbing were to occur

## 2022-04-11 NOTE — Patient Instructions (Signed)

## 2022-05-05 ENCOUNTER — Other Ambulatory Visit: Payer: Self-pay | Admitting: Internal Medicine

## 2022-05-05 DIAGNOSIS — I10 Essential (primary) hypertension: Secondary | ICD-10-CM

## 2022-05-05 DIAGNOSIS — N951 Menopausal and female climacteric states: Secondary | ICD-10-CM

## 2022-05-05 DIAGNOSIS — E039 Hypothyroidism, unspecified: Secondary | ICD-10-CM

## 2022-05-05 MED ORDER — ESTRADIOL 0.5 MG PO TABS: ORAL_TABLET | ORAL | 3 refills | Status: DC

## 2022-05-05 MED ORDER — LEVOTHYROXINE SODIUM 100 MCG PO TABS: ORAL_TABLET | ORAL | 3 refills | Status: DC

## 2022-05-05 MED ORDER — BISOPROLOL-HYDROCHLOROTHIAZIDE 10-6.25 MG PO TABS: ORAL_TABLET | ORAL | 3 refills | Status: DC

## 2022-05-05 MED ORDER — PHENTERMINE HCL 37.5 MG PO TABS: ORAL_TABLET | ORAL | 1 refills | Status: DC

## 2022-05-16 ENCOUNTER — Other Ambulatory Visit: Payer: Self-pay

## 2022-05-16 DIAGNOSIS — N951 Menopausal and female climacteric states: Secondary | ICD-10-CM

## 2022-05-16 MED ORDER — ESTRADIOL 0.5 MG PO TABS: ORAL_TABLET | ORAL | 3 refills | Status: DC

## 2022-05-19 ENCOUNTER — Ambulatory Visit: Payer: Medicare Other | Admitting: Nurse Practitioner

## 2022-05-19 DIAGNOSIS — M1990 Unspecified osteoarthritis, unspecified site: Secondary | ICD-10-CM | POA: Diagnosis not present

## 2022-05-19 DIAGNOSIS — Z8249 Family history of ischemic heart disease and other diseases of the circulatory system: Secondary | ICD-10-CM | POA: Diagnosis not present

## 2022-05-19 DIAGNOSIS — E1142 Type 2 diabetes mellitus with diabetic polyneuropathy: Secondary | ICD-10-CM | POA: Diagnosis not present

## 2022-05-19 DIAGNOSIS — E039 Hypothyroidism, unspecified: Secondary | ICD-10-CM | POA: Diagnosis not present

## 2022-05-19 DIAGNOSIS — Z87891 Personal history of nicotine dependence: Secondary | ICD-10-CM | POA: Diagnosis not present

## 2022-05-19 DIAGNOSIS — I10 Essential (primary) hypertension: Secondary | ICD-10-CM | POA: Diagnosis not present

## 2022-05-19 DIAGNOSIS — K219 Gastro-esophageal reflux disease without esophagitis: Secondary | ICD-10-CM | POA: Diagnosis not present

## 2022-05-19 DIAGNOSIS — R609 Edema, unspecified: Secondary | ICD-10-CM | POA: Diagnosis not present

## 2022-05-19 DIAGNOSIS — E785 Hyperlipidemia, unspecified: Secondary | ICD-10-CM | POA: Diagnosis not present

## 2022-05-19 DIAGNOSIS — Z809 Family history of malignant neoplasm, unspecified: Secondary | ICD-10-CM | POA: Diagnosis not present

## 2022-05-19 DIAGNOSIS — R32 Unspecified urinary incontinence: Secondary | ICD-10-CM | POA: Diagnosis not present

## 2022-05-19 DIAGNOSIS — Z008 Encounter for other general examination: Secondary | ICD-10-CM | POA: Diagnosis not present

## 2022-05-19 DIAGNOSIS — H409 Unspecified glaucoma: Secondary | ICD-10-CM | POA: Diagnosis not present

## 2022-06-27 ENCOUNTER — Encounter: Payer: Self-pay | Admitting: Nurse Practitioner

## 2022-06-27 ENCOUNTER — Ambulatory Visit (INDEPENDENT_AMBULATORY_CARE_PROVIDER_SITE_OTHER): Payer: Medicare HMO | Admitting: Nurse Practitioner

## 2022-06-27 VITALS — BP 130/62 | HR 52 | Temp 97.5°F | Ht 67.5 in | Wt 200.8 lb

## 2022-06-27 DIAGNOSIS — E1122 Type 2 diabetes mellitus with diabetic chronic kidney disease: Secondary | ICD-10-CM | POA: Diagnosis not present

## 2022-06-27 DIAGNOSIS — N182 Chronic kidney disease, stage 2 (mild): Secondary | ICD-10-CM

## 2022-06-27 DIAGNOSIS — Z6830 Body mass index (BMI) 30.0-30.9, adult: Secondary | ICD-10-CM

## 2022-06-27 DIAGNOSIS — E6609 Other obesity due to excess calories: Secondary | ICD-10-CM

## 2022-06-27 DIAGNOSIS — E1142 Type 2 diabetes mellitus with diabetic polyneuropathy: Secondary | ICD-10-CM

## 2022-06-27 DIAGNOSIS — N3281 Overactive bladder: Secondary | ICD-10-CM

## 2022-06-27 DIAGNOSIS — M1990 Unspecified osteoarthritis, unspecified site: Secondary | ICD-10-CM

## 2022-06-27 DIAGNOSIS — E785 Hyperlipidemia, unspecified: Secondary | ICD-10-CM

## 2022-06-27 DIAGNOSIS — E1169 Type 2 diabetes mellitus with other specified complication: Secondary | ICD-10-CM

## 2022-06-27 DIAGNOSIS — E039 Hypothyroidism, unspecified: Secondary | ICD-10-CM | POA: Diagnosis not present

## 2022-06-27 DIAGNOSIS — R6889 Other general symptoms and signs: Secondary | ICD-10-CM

## 2022-06-27 DIAGNOSIS — H401131 Primary open-angle glaucoma, bilateral, mild stage: Secondary | ICD-10-CM

## 2022-06-27 DIAGNOSIS — E559 Vitamin D deficiency, unspecified: Secondary | ICD-10-CM

## 2022-06-27 DIAGNOSIS — I10 Essential (primary) hypertension: Secondary | ICD-10-CM

## 2022-06-27 DIAGNOSIS — Z0001 Encounter for general adult medical examination with abnormal findings: Secondary | ICD-10-CM

## 2022-06-27 DIAGNOSIS — N183 Chronic kidney disease, stage 3 unspecified: Secondary | ICD-10-CM | POA: Diagnosis not present

## 2022-06-27 DIAGNOSIS — Z13820 Encounter for screening for osteoporosis: Secondary | ICD-10-CM

## 2022-06-27 DIAGNOSIS — G4709 Other insomnia: Secondary | ICD-10-CM

## 2022-06-27 DIAGNOSIS — K219 Gastro-esophageal reflux disease without esophagitis: Secondary | ICD-10-CM | POA: Diagnosis not present

## 2022-06-27 DIAGNOSIS — Z0000 Encounter for general adult medical examination without abnormal findings: Secondary | ICD-10-CM

## 2022-06-27 DIAGNOSIS — Z79899 Other long term (current) drug therapy: Secondary | ICD-10-CM

## 2022-06-27 MED ORDER — ATORVASTATIN CALCIUM 10 MG PO TABS: ORAL_TABLET | ORAL | 0 refills | Status: DC

## 2022-06-27 NOTE — Patient Instructions (Signed)
Statin Intolerance Statin intolerance is the inability to take a certain type of cholesterol-lowering medicine (statin) because of unwanted side effects, such as muscle pain. Statins may be prescribed to decrease cholesterol levels and lower the risk for heart attack, heart disease, and stroke. People with statin intolerance may experience muscle pain and cramps (myalgia) that go away when the statin is stopped. What are the causes? The cause of this condition is not known. What increases the risk? You may be at higher risk for statin intolerance if you: Take more than one type of cholesterol-lowering medicine at a time. Need a higher-than-normal dosage of a statin. Have any of these medical problems: A history of surgery or trauma. Kidney, liver, joint, or muscle disease. A low level of hormones that control how your body uses energy (hypothyroidism). A lack of vitamin D (deficiency). Have a family history of muscle aches with or without statin therapy. Drink a lot of alcohol. Have any of these characteristics: Being older than 82 years of age. Having a body size that is smaller than normal. Being female. Being of Asian descent. Take certain medicines, including: Certain medicines for mental illness (antipsychotics). Some types of antibiotics or antifungals. Certain medicines used for blood pressure or heart disease. Medicines that reduce the activity of the body's disease-fighting system (immunosuppressants). Medicines to treat hepatitis C and HIV or AIDS (human immunodeficiency virus or acquired immunodeficiency syndrome). Follow an intense exercise program. Drink a lot of grapefruit juice. What are the signs or symptoms? Symptoms of statin intolerance include: General muscle aches, or myalgia. This may feel similar to muscle aches that are caused by the flu. Muscle pain, tenderness, cramps, or weakness (myositis). Severe muscle pain, weakness, and raised blood CK levels  (rhabdomyolysis). Symptoms usually go away when the statin is stopped. Rarely, liver damage can also occur, which can cause: Dark yellow or brown urine. Light-colored stool (feces). Loss of appetite. Pain in the upper right abdomen. Unusual weakness or fatigue. Yellowing of the skin or the white parts of the eyes (jaundice). How is this diagnosed? This condition is diagnosed based on your symptoms, your medical history, and a physical exam. You may also have blood tests. How is this treated? Your health care provider may have you stop taking the statin for a short time to see if your symptoms go away. Then your provider may restart your statin or: Change you to a different statin. Lower the dosage of your statin. Have you take your statin less often. Change you to another type of cholesterol-lowering medicine. Stop or change any medicines that might be interfering with your statin. Limit how much grapefruit juice you drink. Recommend stopping intense exercise. In most cases, statin intolerance can be managed, and you can continue to take a cholesterol-lowering medicine. Follow these instructions at home: Medicines Take your statin medicine as told by your health care provider. Do not stop taking the statin unless your health care provider tells you to stop. Take other over-the-counter and prescription medicines only as told by your health care provider. Check with your health care provider before taking any new medicines. Certain medicines can increase your risk for statin intolerance. Lifestyle If you drink alcohol: Limit how much you have to: 0-1 drink a day for women who are not pregnant. 0-2 drinks a day for men. Know how much alcohol is in a drink. In the [place] one drink equals one 12 oz bottle of beer (355 mL), one 5 oz glass of wine (148 mL), or  one 1 oz glass of hard liquor (44 mL). Do not use any products that contain nicotine or tobacco. These products include cigarettes,  chewing tobacco, and vaping devices, such as e-cigarettes. If you need help quitting, ask your health care provider. General instructions  Have blood tests to check CK levels or liver enzymes as told by your health care provider. Exercise as directed. Ask your health care provider what exercises are best for you. Do not start a new exercise program before talking about it with your health care provider. Follow instructions from your health care provider about eating or drinking restrictions. Your health care provider may recommend: Limiting the amount of grapefruit juice you drink, or not drinking it at all. Eating a diet that is low in saturated fats and high in fiber. Maintain a healthy weight with diet and exercise. Keep all follow-up visits. This is important. Contact a health care provider if: You have any symptoms of statin intolerance. Summary Statins are important medicines for decreasing your cholesterol, which may reduce your risk for heart attack and stroke. Some people are not able to continue taking a particular statin because of muscle problems (myalgia) or other side effects. Myalgia is the most common symptom of statin intolerance. Often, the muscle pain and cramps from myalgia go away when the statin is stopped. Although rare, liver damage can occur as a result of statin intolerance. You should have routine blood tests to check your liver enzymes. In most cases, statin intolerance can be managed, and you can continue to take a cholesterol-lowering medicine. This information is not intended to replace advice given to you by your health care provider. Make sure you discuss any questions you have with your health care provider. Document Revised: 04/16/2020 Document Reviewed: 04/16/2020 Elsevier Patient Education  2023 [employer].

## 2022-06-27 NOTE — Progress Notes (Signed)
MEDICARE ANNUAL WELLNESS VISIT AND FOLLOW UP  Assessment:   Annual Medicare Wellness Visit Due annually  Health maintenance reviewed  Essential hypertension Discussed DASH (Dietary Approaches to Stop Hypertension) DASH diet is lower in sodium than a typical American diet. Cut back on foods that are high in saturated fat, cholesterol, and trans fats. Eat more whole-grain foods, fish, poultry, and nuts Remain active and exercise as tolerated daily.  Monitor BP at home-Call if greater than 130/80.  Check CMP/CBC   Hyperlipidemia associated with T2DM (HCC) Stop Rosuvastatin d/t mylagia. Start Atorvastatin.   Discussed lifestyle modifications. Recommended diet heavy in fruits and veggies, omega 3's. Decrease consumption of animal meats, cheeses, and dairy products. Remain active and exercise as tolerated. Continue to monitor. Check lipids/TSH  Gastroesophageal reflux disease, esophagitis presence not specified No suspected reflux complications (Barret/stricture). Lifestyle modification:  wt loss, avoid meals 2-3h before bedtime. Consider eliminating food triggers:  chocolate, caffeine, EtOH, acid/spicy food.  Diet controlled T2DM [facility])  Education: Reviewed 'ABCs' of diabetes management  Discussed goals to be met and/or maintained include A1C (<7) Blood pressure (<130/80) Cholesterol (LDL <70) Continue Eye Exam yearly  Continue Dental Exam Q6 mo Discussed dietary recommendations Discussed Physical Activity recommendations Check A1C  CKD II associated with T2DM (HCC) Discussed how what you eat and drink can aide in kidney protection. Stay well hydrated. Avoid high salt foods. Avoid NSAIDS. Keep BP and BG well controlled.   Take medications as prescribed. Remain active and exercise as tolerated daily. Maintain weight.  Continue to monitor. Check CMP/GFR/Microablumin  Hypothyroidism, unspecified type Controlled. Continue Levothyroxine. Reminded to take on an empty  stomach 30-49mins before food.  Stop any Biotin Supplement 48-72 hours before next TSH level to reduce the risk of falsely low TSH levels. Continue to monitor.     Peripheral sensory neuropathy, diabetic (HCC) Control glucose, do daily food checks Gabapentin PRN   Osteoarthritis, unspecified osteoarthritis type, unspecified site S/P TKR Right. Followed by Dr. Turner Daniels RICE when flared Stay well hydrated  Obesity BMI 30 with diabetes, htn, hyperlipidemia Discussed appropriate BMI Diet modification. Physical activity. Encouraged/praised to build confidence.  Vitamin D deficiency Continue supplement for goal of 60-100 Monitor Vitamin D levels  Medication management All medications discussed and reviewed in full. All questions and concerns regarding medications addressed.    Glaucoma Dr. Silas Flood following - last 12/23 Continue eye gtts   Insomnia  Discussed good sleep hygiene. Establish bed and wake times. Sleep restriction-only sleep estimated hrs sleep. Bed only for sleep, only sleep when sleepy, out of bed if anxious (stimulus control). Reviewed relaxation techniques, mindful meditations. Expected sleep duration. Addressed worries about not sleeping.   Urinary frequency/ OAB Continue pelvic floor exercises. Continue to monitor  Screening for osteoporosis Pursue a combination of weight-bearing exercises and strength training. Patients with severe mobility impairment should be referred for physical therapy. Advised on fall prevention measures including proper lighting in all rooms, removal of area rugs and floor clutter, use of walking devices as deemed appropriate, avoidance of uneven walking surfaces. Smoking cessation and moderate alcohol consumption if applicable Consume 800 to 1000 IU of vitamin D daily with a goal vitamin D serum value of 30 ng/mL or higher. Aim for 1000 to 1200 mg of elemental calcium daily through supplements and/or dietary  sources.  Orders Placed This Encounter  Procedures   DG Bone Density    Standing Status:   Future    Standing Expiration Date:   06/27/2023    Order  Specific Question:   Reason for Exam (SYMPTOM  OR DIAGNOSIS REQUIRED)    Answer:   Screening for osteoporosis    Order Specific Question:   Preferred imaging location?    Answer:   [facility]   CBC with Differential/Platelet   COMPLETE METABOLIC PANEL WITH GFR   Lipid panel   Hemoglobin A1c   VITAMIN D 25 Hydroxy (Vit-D Deficiency, Fractures)   TSH   Notify office for further evaluation and treatment, questions or concerns if any reported s/s fail to improve.   The patient was advised to call back or seek an in-person evaluation if any symptoms worsen or if the condition fails to improve as anticipated.   Further disposition pending results of labs. Discussed med's effects and SE's.    I discussed the assessment and treatment plan with the patient. The patient was provided an opportunity to ask questions and all were answered. The patient agreed with the plan and demonstrated an understanding of the instructions.  Discussed med's effects and SE's. Screening labs and tests as requested with regular follow-up as recommended.  I provided 35 minutes of face-to-face time during this encounter including counseling, chart review, and critical decision making was preformed.  Today's Plan of Care is based on a patient-centered health care approach known as shared decision making - the decisions, tests and treatments allow for patient preferences and values to be balanced with clinical evidence.     Future Appointments  Date Time Provider Department Center  08/10/2022 11:00 AM [name], MD [facility] None  03/23/2023 11:00 AM Adela Glimpse, NP [facility] None     Plan:   During the course of the visit the patient was educated and counseled about appropriate screening and preventive services including:   Pneumococcal vaccine   Influenza vaccine Td vaccine Screening electrocardiogram Screening mammography Bone densitometry screening Colorectal cancer screening Diabetes screening Glaucoma screening Nutrition counseling  Advanced directives: given info/requested   Subjective:   Rebecca Graves is a 82 y.o. female who presents for Medicare Annual Wellness Visit and 3 month follow up on HTN, T2 diabetes with CKD and neuropathy, hyperlipidemia, vitamin D def. 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  Overall she reports feeling well today.  She has no new concerns at this time.  She is established with Dr. Turner Daniels, s/p R TKA 03/2021.  Reports doing well overall.  GERD- managing at this time, taking omeprazole daily.  Hx hysterectomy and bladder suspension remotely, no recent pelvic, she notes in the last 2-3 years has noted increased urinary frequency, some urgency with leaking bladder, urinates every hour or so, gets up 3-4 times a night. Continues to do pelvic floor exercises.  BMI is Body mass index is 30.99 kg/m., she has been working on diet and exercise.  Works daily in garden. Wt Readings from Last 3 Encounters:  06/27/22 200 lb 12.8 oz (91.1 kg)  02/14/22 190 lb 3.2 oz (86.3 kg)  11/08/21 210 lb (95.3 kg)   Her blood pressure has been controlled at home, today their BP is BP: 130/62 She  does workout. She denies chest pain, shortness of breath, dizziness.    She is on cholesterol medication (on rosuvastatin 5 mg three days a week) and reports increase myalgias most notable BLE, bilateral calves and bilateral hands. Her cholesterol is at goal. The cholesterol last visit was:   Lab Results  Component Value Date   CHOL 157 02/14/2022   HDL 72 02/14/2022   LDLCALC 69 02/14/2022   TRIG 79 02/14/2022   CHOLHDL 2.2 02/14/2022   She has been working on diet and exercise for Diet controlled T2DM with CKD 2 and neuropathy.  She has cut out soda, switched to diet tea, she is on ASA and she is not on ACE - declines at this time  Denies increased appetite, nausea, polydipsia, polyuria and vomiting.  She does have neuropathy, does well during the day, using gabapentin at night.  She checks occasional fasting sugars, typically <120, rare up to 130.  Last A1C in the office was:  Lab Results  Component Value Date   HGBA1C 6.0 (H) 02/14/2022   She has been pushing fluid intake:   Lab Results  Component Value Date   GFRAA 36 07/09/2020   Patient is on Vitamin D supplement and at goal at recent check:  Lab Results  Component Value Date   VD25OH 86 02/14/2022     She is on thyroid medication. Her medication was not changed last visit, taking 100 mcg tab, 1 tab daily on empty stomach. Patient denies nervousness, palpitations and weight changes.  Lab Results  Component Value Date   TSH 0.11 (L) 02/14/2022     Medication Review Current Outpatient Medications on File Prior to Visit  Medication Sig Dispense Refill   acetaminophen (TYLENOL) 500 MG tablet Take 1,000 mg by mouth every 6 (six) hours as needed for moderate pain.     Alcohol Swabs (B-D SINGLE USE SWABS REGULAR) PADS Use 1 swab prior to checking blood sugar daily-DX-E11.9 100 each 3   aspirin EC 81 MG tablet Take 1 tablet (81 mg total) by mouth 2 (two) times daily. 60 tablet 0   Biotin w/ Vitamins C & E (HAIR SKIN & NAILS GUMMIES PO) Take 1 tablet by mouth in the morning and at bedtime.      bisoprolol-hydrochlorothiazide (ZIAC) 10-6.25 MG tablet Take 1 tablet Daily for  BP 90 tablet 3   Blood Glucose Calibration (TRUE METRIX LEVEL 1) Low SOLN Check blood sugar 1 time daily-DX-E11.9. 3 each 3   blood glucose meter kit and supplies KIT Test blood sugar once daily. 1 each 0   Blood Glucose Monitoring Suppl (TRUE METRIX METER) w/Device KIT Check blood sugar 1 time daily-DX-E11.9 1 kit 0   Cholecalciferol (VITAMIN D) 50 MCG (2000 UT) tablet Take 4,000 Units by mouth daily.     docusate sodium (COLACE) 100 MG capsule Take 100 mg by mouth 2 (two) times daily.     estradiol (ESTRACE) 0.5 MG tablet Take  1 tablet   Daily  to Prevent Hot Flashes & Night Sweats 90 tablet 3   furosemide (LASIX) 40 MG tablet TAKE 1 TABLET BY MOUTH  DAILY FOR BLOOD PRESSURE  AND FLUID RETENTION Lynnda Child  SWELLING 90 tablet 3   gabapentin (NEURONTIN) 600 MG tablet TAKE 1/2 TO 1 TABLET BY  MOUTH 2 TO 3 TIMES DAILY AS NEEDED FOR PAIN 270 tablet 3   glucose blood test strip Check blood sugar once daily 100 each 3   Lancets MISC Test blood sugar once daily.  200 each 11   latanoprost (XALATAN) 0.005 % ophthalmic solution Place 1 drop into both eyes at bedtime.     levothyroxine (SYNTHROID) 100 MCG tablet Take  1 tablet  Daily  on an empty stomach with only water for 30 minutes & no Antacid meds, Calcium or Magnesium for 4 hours & avoid Biotin 90 tablet 3   magnesium oxide (MAG-OX) 400 MG tablet Take 400 mg by mouth 2 (two) times daily.     metFORMIN (GLUCOPHAGE-XR) 500 MG 24 hr tablet Take 2 tablets 2 x/ day with meals  for Diabetes 360 tablet 3   omeprazole (PRILOSEC) 20 MG capsule TAKE 1 CAPSULE BY MOUTH  TWICE DAILY (EVERY 12  HOURS) TO PREVENT HEARTBURN AND INDIGESTION 180 capsule 3   phentermine (ADIPEX-P) 37.5 MG tablet Take 1/2 to 1 tablet every Morning for Dieting & Weight Loss 90 tablet 1   rosuvastatin (CRESTOR) 5 MG tablet TAKE 1 TABLET BY MOUTH 3 TIMES  WEEKLY ON MONDAY, WEDNESDAY, AND FRIDAY FOR CHOLESTEROL  43 tablet 2   topiramate (TOPAMAX) 50 MG tablet TAKE 1 TO 2 TABLETS BY MOUTH AT  BEDTIME FOR DIETING &amp; WEIGHT  LOSS 200 tablet 2   Trypsin-Castor Oil-Peru Balsam (OPTASE EX) Place 1 application into both eyes daily.     vitamin B-12 (CYANOCOBALAMIN) 1000 MCG tablet Take 1,000 mcg by mouth daily.     No current facility-administered medications on file prior to visit.    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 (HCC) 04/19/2021   History of revision of total replacement of right hip joint 04/19/2021   OAB (overactive bladder) 04/08/2021   Diet-controlled diabetes mellitus (HCC) 06/19/2018   Insomnia 10/26/2017   Diabetic peripheral neuropathy (HCC) 01/18/2015   CKD stage 2 due to type 2 diabetes mellitus (HCC) 01/18/2015   Morbid obesity (HCC) BMI 35 associated with htn, hyperlipidemia, arthritis, T2DM 07/03/2014   Vitamin D deficiency 03/31/2013   Medication management 03/31/2013   DJD    Glaucoma    Hypothyroidism    Hypertension    Hyperlipidemia associated with type 2 diabetes mellitus (HCC)    GERD (gastroesophageal reflux disease)     Screening Tests Immunization History  Administered Date(s) Administered   Influenza, High Dose Seasonal PF 11/25/2016, 11/24/2017, 10/17/2018, 11/14/2018, 11/28/2020   Influenza-Unspecified 12/09/2013, 11/14/2015, 11/23/2016   PFIZER Comirnaty(Gray Top)Covid-19 Tri-Sucrose Vaccine 01/27/2021   PFIZER(Purple Top)SARS-COV-2 Vaccination 05/12/2019, 06/01/2019, 12/16/2019, 06/08/2020   Pneumococcal Conjugate-13 04/21/2015   Pneumococcal Polysaccharide-23 05/29/2008   Td 04/26/2007   Health Maintenance  Topic Date Due   Zoster Vaccines- Shingrix (1 of 2) Never done   DTaP/Tdap/Td (2 - Tdap) 04/25/2017   COVID-19 Vaccine (6 - 2023-24 season) 10/29/2021   FOOT EXAM  08/05/2022   Diabetic kidney evaluation - Urine ACR  08/06/2022    HEMOGLOBIN A1C  08/16/2022   MAMMOGRAM  09/17/2022   INFLUENZA VACCINE  09/29/2022   OPHTHALMOLOGY EXAM  02/10/2023   Diabetic kidney evaluation - eGFR measurement  02/15/2023   Medicare Annual Wellness (AWV)  06/27/2023   Pneumonia Vaccine 25+ Years old  Completed   DEXA SCAN  Completed   HPV VACCINES  Aged Out    Last colonoscopy: Dr. Russella Dar on 11/2017, polyp, no recall due to age  Last pap smear/pelvic exam: remote, declines further Last mammogram: gets annually at [facility] DEXA: 2017, normal, Due - ordered  TD or Tdap: 2009, declines  due to cost, boost PRN Shingles/Zostavax: declines due to price  Names of Other Physician/Practitioners you currently use: 1. [facility]- here for primary care 2. Dr. Sharlot Gowda, eye doctor,has appointment coming up, visits every 3-4 months for glacoma, last 2023 report requested 3. Can't remember name, dentist, last visit 2017, has partials, encouraged to schedule follow up  Patient Care Team: [name], MD as PCP - General (Internal Medicine) Gelene Mink, OD as Referring Physician (Optometry) Sheran Luz, MD as Consulting Physician (Physical Medicine and Rehabilitation) Meryl Dare, MD as Consulting Physician (Gastroenterology) Esaw Dace, MD as Attending Physician (Urology)   Allergies Allergies  Allergen Reactions   Meloxicam Swelling    Weight gain    SURGICAL HISTORY She  has a past surgical history that includes Abdominal hysterectomy (1988); Appendectomy; Joint replacement (2007); Bladder suspension; Knee arthroscopy (03/28/2011); Colonoscopy; right knee srthroplasty; and Total hip revision (Right, 04/19/2021). FAMILY HISTORY Her family history includes Colon polyps in her sister; Diabetes in her sister; Esophageal cancer in some other family members. SOCIAL HISTORY She  reports that she quit smoking about 26 years ago. Her smoking use included cigarettes. She has never  used smokeless tobacco. She reports current alcohol use of about 2.0 standard drinks of alcohol per week. She reports that she does not use drugs.  MEDICARE WELLNESS OBJECTIVES: Physical activity:   Cardiac risk factors:   Depression/mood screen:      06/27/2022   10:42 AM  Depression screen PHQ 2/9  Decreased Interest 0  Down, Depressed, Hopeless 0  PHQ - 2 Score 0    ADLs:     06/27/2022   10:37 AM 02/13/2022    8:11 PM  In your present state of health, do you have any difficulty performing the following activities:  Hearing? 0 0  Vision? 1 0  Comment Reading small print   Difficulty concentrating or making decisions? 0 0  Walking or climbing stairs? 0 0  Dressing or bathing? 0 0  Doing errands, shopping? 0 0  Preparing Food and eating ? N   Using the Toilet? N   In the past six months, have you accidently leaked urine? N   Do you have problems with loss of bowel control? N   Managing your Medications? N   Managing your Finances? N   Housekeeping or managing your Housekeeping? N       Cognitive Testing  Alert? Yes  Normal Appearance?Yes  Oriented to person? Yes  Place? Yes   Time? Yes  Recall of three objects?  Yes  Can perform simple calculations? Yes  Displays appropriate judgment?Yes  Can read the correct time from a watch face?Yes  EOL planning: Does Patient Have a Medical Advance Directive?: No Would patient like information on creating a medical advance directive?: No - Patient declined   Objective:   Blood pressure 130/62, pulse (!) 52, temperature (!) 97.5 F (36.4 C), height 5' 7.5" (1.715 m), weight 200 lb 12.8 oz (91.1 kg), SpO2 99 %. Body mass index is 30.99 kg/m.  General appearance: alert, no distress, WD/WN,  female HEENT: normocephalic, sclerae anicteric, TMs pearly, nares patent, no discharge or erythema, pharynx normal Oral cavity: MMM, no lesions Neck: supple, no lymphadenopathy, no thyromegaly, no masses Heart: RRR, normal S1, S2, no  murmurs Lungs: CTA bilaterally, no wheezes, rhonchi, or rales Abdomen: +bs, soft, non tender, non distended, no masses, no hepatomegaly, no splenomegaly Musculoskeletal: nontender, antalgic gait with reduced R hip ROM Extremities:  no edema, no cyanosis, no clubbing Pulses: 2+ symmetric, upper and lower extremities, normal cap refill Neurological: alert, oriented x 3, CN2-12 intact, strength normal upper extremities and lower extremities, sensation diminished bil feet to monofilament, DTRs 2+ throughout, no cerebellar signs, gait antalgic but steady  Psychiatric: normal affect, behavior normal, pleasant  Breast: defer Gyn: defer Rectal: defer  Medicare Attestation I have personally reviewed: The patient's medical and social history Their use of alcohol, tobacco or illicit drugs Their current medications and supplements The patient's functional ability including ADLs,fall risks, home safety risks, cognitive, and hearing and visual impairment Diet and physical activities Evidence for depression or mood disorders  The patient's weight, height, BMI, and visual acuity have been recorded in the chart.  I have made referrals, counseling, and provided education to the patient based on review of the above and I have provided the patient with a written personalized care plan for preventive services.     Adela Glimpse, NP   04/06/2020

## 2022-06-28 LAB — COMPLETE METABOLIC PANEL WITH GFR
AG Ratio: 1.5 (calc) (ref 1.0–2.5)
ALT: 8 U/L (ref 6–29)
AST: 12 U/L (ref 10–35)
Albumin: 4.1 g/dL (ref 3.6–5.1)
Alkaline phosphatase (APISO): 54 U/L (ref 37–153)
BUN: 17 mg/dL (ref 7–25)
CO2: 32 mmol/L (ref 20–32)
Calcium: 10.3 mg/dL (ref 8.6–10.4)
Chloride: 104 mmol/L (ref 98–110)
Creat: 0.86 mg/dL (ref 0.60–0.95)
Globulin: 2.7 g/dL (calc) (ref 1.9–3.7)
Glucose, Bld: 102 mg/dL — ABNORMAL HIGH (ref 65–99)
Potassium: 4.4 mmol/L (ref 3.5–5.3)
Sodium: 143 mmol/L (ref 135–146)
Total Bilirubin: 0.7 mg/dL (ref 0.2–1.2)
Total Protein: 6.8 g/dL (ref 6.1–8.1)
eGFR: 68 mL/min/{1.73_m2} (ref >=60)

## 2022-06-28 LAB — CBC WITH DIFFERENTIAL/PLATELET
Absolute Monocytes: 525 cells/uL (ref 200–950)
Basophils Absolute: 41 cells/uL (ref 0–200)
Basophils Relative: 1 %
Eosinophils Absolute: 90 cells/uL (ref 15–500)
Eosinophils Relative: 2.2 %
HCT: 39.2 % (ref 35.0–45.0)
Hemoglobin: 13 g/dL (ref 11.7–15.5)
Lymphs Abs: 1640 cells/uL (ref 850–3900)
MCH: 27 pg (ref 27.0–33.0)
MCHC: 33.2 g/dL (ref 32.0–36.0)
MCV: 81.3 fL (ref 80.0–100.0)
MPV: 9.7 fL (ref 7.5–12.5)
Monocytes Relative: 12.8 %
Neutro Abs: 1804 cells/uL (ref 1500–7800)
Neutrophils Relative %: 44 %
Platelets: 235 10*3/uL (ref 140–400)
RBC: 4.82 10*6/uL (ref 3.80–5.10)
RDW: 13.7 % (ref 11.0–15.0)
Total Lymphocyte: 40 %
WBC: 4.1 10*3/uL (ref 3.8–10.8)

## 2022-06-28 LAB — LIPID PANEL
Cholesterol: 186 mg/dL (ref <200)
HDL: 101 mg/dL (ref >=50)
LDL Cholesterol (Calc): 70 mg/dL (calc)
Non-HDL Cholesterol (Calc): 85 mg/dL (calc) (ref <130)
Total CHOL/HDL Ratio: 1.8 (calc) (ref <5.0)
Triglycerides: 66 mg/dL (ref <150)

## 2022-06-28 LAB — HEMOGLOBIN A1C
Hgb A1c MFr Bld: 6 % of total Hgb — ABNORMAL HIGH (ref <5.7)
Mean Plasma Glucose: 126 mg/dL
eAG (mmol/L): 7 mmol/L

## 2022-06-28 LAB — TSH: TSH: 7.71 mIU/L — ABNORMAL HIGH (ref 0.40–4.50)

## 2022-06-28 LAB — VITAMIN D 25 HYDROXY (VIT D DEFICIENCY, FRACTURES): Vit D, 25-Hydroxy: 76 ng/mL (ref 30–100)

## 2022-07-01 ENCOUNTER — Telehealth: Payer: Self-pay

## 2022-07-01 NOTE — Telephone Encounter (Signed)
Patient advised.

## 2022-07-01 NOTE — Telephone Encounter (Signed)
In regard to her Levothyroxine, she states that she takes one tablet daily except for Wed/Sun, takes 1/2 tablet Do you want to make any changes? Please advise.

## 2022-07-06 ENCOUNTER — Telehealth: Payer: Self-pay | Admitting: Nurse Practitioner

## 2022-07-06 NOTE — Telephone Encounter (Signed)
Even after changing dosing for Atorvastatin to 3 days a week, she is still having cramps in her hand. Please advise.Marland Kitchen

## 2022-07-07 NOTE — Telephone Encounter (Signed)
Left detailed message on voicemail.  

## 2022-07-27 ENCOUNTER — Other Ambulatory Visit: Payer: Self-pay | Admitting: Internal Medicine

## 2022-08-09 DIAGNOSIS — H401131 Primary open-angle glaucoma, bilateral, mild stage: Secondary | ICD-10-CM | POA: Diagnosis not present

## 2022-08-10 ENCOUNTER — Other Ambulatory Visit: Payer: Self-pay | Admitting: Internal Medicine

## 2022-08-10 ENCOUNTER — Encounter: Payer: Medicare HMO | Admitting: Internal Medicine

## 2022-08-10 DIAGNOSIS — Z0000 Encounter for general adult medical examination without abnormal findings: Secondary | ICD-10-CM

## 2022-08-10 NOTE — Progress Notes (Signed)
Future Appointments  Date Time Provider Department  08/11/2022 11:00 AM [name], MD [facility]  10/03/2022 11:00 AM [name], MD [facility]  03/23/2023 11:00 AM Adela Glimpse, NP [facility]    History of Present Illness:      This very nice 82 y.o. DBF  with HTN, HLD, T2_NIDDM, Hypothyroidism   and Vitamin D Deficiency presents with c/o cramps . Recent   labs on 40/29/2024 found Potassium 4.4 an on 02/15/2022 Magnesium was low normal at 1.7.  She has previously been recommended to take 400 mg 2 x /day.     Current Outpatient Medications on File Prior to Visit  Medication Sig   acetaminophen (TYLENOL) 500 MG tablet Take 1,000 mg  every 6  hours as needed   aspirin EC 81 MG tablet Take 1 tablet  2  times daily.   atorvastatin (LIPITOR) 10 MG tablet Take 1 tablet 10  mg every Mon, Wed, Fri.   Biotin w/ Vitamins C & E  Take 1 tablet  in the morning and at bedtime.   bisoprolol-hctz 10-6.25 MG tablet Take 1 tablet Daily for  BP   VITAMIN D  4,000 u Take  daily.   docusate sodium (COLACE) 100 MG capsule Take 12 (two) times daily.   estradiol (ESTRACE) 0.5 MG tablet Take  1 tablet   Daily     furosemide (LASIX) 40 MG tablet TAKE 1 TABLET DAILY    gabapentin (NEURONTIN) 600 MG tablet TAKE 1/2 -1 TAB 2-3 TIMES DAILY AS NEEDED    latanoprost (XALATAN) 0.005 % ophthalmic solution Place 1 drop into both eyes at bedtime.   levothyroxine  100 MCG tablet Take  1 tablet  Daily     magnesium tablet Take 2 times daily.   metFORMIN -XR 500 MG 24 hr tablet TAKE 2 TABLETS TWICE A DAY    omeprazole  20 MG capsule TAKE 1 CAPSULE   TWICE DAILY    phentermine 37.5 MG tablet Take 1/2 to 1 tablet every Morning    topiramate (TOPAMAX) 50 MG tablet TAKE 1 TO 2 TABLETS AT  BEDTIME    Trypsin-Castor Oil-[place]u Balsam (OPTASE EX) Place 1 application into both eyes daily.   vitamin B-12  1000 MCG tablet Take 1,000 mcg  daily.     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   Observations/Objective:  BP 134/78   Pulse (!) 56   Temp 97.6 F (36.4 C)   Resp 17   Ht 5' 7.5" (1.715 m)   Wt 202 lb (91.6 kg)   SpO2 97%   BMI 31.17 kg/m   HEENT - WNL. Neck - supple.  Chest - Clear equal BS. Cor - Nl HS. RRR w/o sig MGR. PP 1(+). No edema. MS- FROM w/o deformities.  Gait Nl. Neuro -  Nl w/o focal abnormalities.   Assessment and Plan:   1. Essential hypertension  - COMPLETE METABOLIC PANEL WITH GFR - Magnesium  2. Muscle cramps  - COMPLETE METABOLIC PANEL WITH GFR - Magnesium - CK  3. Medication management  - COMPLETE METABOLIC PANEL WITH GFR - Magnesium - CK   Follow Up Instructions:        I discussed the assessment and treatment plan with the patient. The patient was provided an opportunity to ask questions and all were answered. The patient agreed with the plan and demonstrated an understanding of the instructions.       The patient was advised to call back or seek an in-person evaluation if the symptoms worsen or if the condition fails to improve as anticipated.    Marinus Maw, MD

## 2022-08-11 ENCOUNTER — Encounter: Payer: Self-pay | Admitting: Internal Medicine

## 2022-08-11 ENCOUNTER — Ambulatory Visit (INDEPENDENT_AMBULATORY_CARE_PROVIDER_SITE_OTHER): Payer: Medicare HMO | Admitting: Internal Medicine

## 2022-08-11 VITALS — BP 134/78 | HR 56 | Temp 97.6°F | Resp 17 | Ht 67.5 in | Wt 202.0 lb

## 2022-08-11 DIAGNOSIS — I10 Essential (primary) hypertension: Secondary | ICD-10-CM | POA: Diagnosis not present

## 2022-08-11 DIAGNOSIS — R252 Cramp and spasm: Secondary | ICD-10-CM

## 2022-08-11 DIAGNOSIS — Z79899 Other long term (current) drug therapy: Secondary | ICD-10-CM

## 2022-08-11 DIAGNOSIS — H348132 Central retinal vein occlusion, bilateral, stable: Secondary | ICD-10-CM | POA: Diagnosis not present

## 2022-08-12 LAB — MAGNESIUM: Magnesium: 1.9 mg/dL (ref 1.5–2.5)

## 2022-08-12 LAB — COMPLETE METABOLIC PANEL WITH GFR
AG Ratio: 1.8 (calc) (ref 1.0–2.5)
ALT: 12 U/L (ref 6–29)
AST: 15 U/L (ref 10–35)
Albumin: 4.6 g/dL (ref 3.6–5.1)
Alkaline phosphatase (APISO): 57 U/L (ref 37–153)
BUN: 21 mg/dL (ref 7–25)
CO2: 28 mmol/L (ref 20–32)
Calcium: 10.6 mg/dL — ABNORMAL HIGH (ref 8.6–10.4)
Chloride: 101 mmol/L (ref 98–110)
Creat: 0.92 mg/dL (ref 0.60–0.95)
Globulin: 2.6 g/dL (calc) (ref 1.9–3.7)
Glucose, Bld: 94 mg/dL (ref 65–99)
Potassium: 4.2 mmol/L (ref 3.5–5.3)
Sodium: 140 mmol/L (ref 135–146)
Total Bilirubin: 0.6 mg/dL (ref 0.2–1.2)
Total Protein: 7.2 g/dL (ref 6.1–8.1)
eGFR: 63 mL/min/{1.73_m2} (ref >=60)

## 2022-08-12 LAB — CK: Total CK: 54 U/L (ref 29–143)

## 2022-08-13 ENCOUNTER — Other Ambulatory Visit: Payer: Self-pay | Admitting: Internal Medicine

## 2022-08-13 MED ORDER — AMITRIPTYLINE HCL 25 MG PO TABS: ORAL_TABLET | ORAL | 0 refills | Status: DC

## 2022-10-02 ENCOUNTER — Encounter: Payer: Self-pay | Admitting: Internal Medicine

## 2022-10-02 NOTE — Progress Notes (Unsigned)
Annual Screening/Preventative Visit & Comprehensive Evaluation &  Examination   Future Appointments  Date Time Provider Department  10/03/2022                   cpe 11:00 AM [name], MD [facility]  03/23/2023                 wellness 11:00 AM Adela Glimpse, NP [facility]  10/16/2023                  cpe 11:00 AM [name], MD [facility]           This very nice 82 y.o. DBF  presents for a Screening /Preventative Visit & comprehensive evaluation and management of multiple medical co-morbidities.  Patient has been followed for HTN, HLD, T2_DM    and Vitamin D Deficiency. Patient has Gout controlled on her meds.  Morbid obesity  (BMI 35) associated with HTN, Hyperlipidemia  &  T2DM        HTN predates circa 1997. Patient's BP has been controlled at home and patient denies any cardiac symptoms as chest pain, palpitations, shortness of breath, dizziness or ankle swelling. Today's BP   is at goal  -                                .        Patient's hyperlipidemia is controlled with diet and Rosuvastatin. Patient denies myalgias or other medication SE's. Last lipids were at goal :  Lab Results  Component Value Date   CHOL 186 06/27/2022   HDL 101 06/27/2022   LDLCALC 70 06/27/2022   TRIG 66 06/27/2022   CHOLHDL 1.8 06/27/2022         Patient has moderate Obesity (BMI 35+) and  diet controlled T2_NIDDM (2013) w/CKD2 (GFR 63 )  & with  PSN.  Patient denies reactive hypoglycemic symptoms, visual blurring, diabetic polys or paresthesias. Last A1c was not at goal:  Lab Results  Component Value Date   HGBA1C 6.0 (H) 06/27/2022         Finally, patient has history of Vitamin D Deficiency ("26" /2008) and last Vitamin D was at goal:  Lab Results  Component Value Date   VD25OH 76 06/27/2022       Current Outpatient Medications on File Prior to Visit  Medication Sig   ALPRAZolam 1 MG tab Take 1/2-1 tablet 1 to 2 /day ONLY if needed for sleep    aspirin 81 MG  tab Takes two tablets daily   bisoprolol-hctz  10-6.25 MG tab TAKE 1 TABLET EVERY DAY    VITAMIN D 5000 u Take 1 tablet daily.   furosemide 40 MG tab Take  1 tablet  Daily   gabapentin  600 MG tab Take  1/2 to 1 tablet  2 to 3 x /Daily  as needed for Pain   XALATAN 0.005 % ophth  soln Place 1 drop into both eyes daily.    levothyroxine 100 MCG tab Take 1 tablet Daily   Magnesium 500 MG CAPS Take 2 capsules  daily.    Omega-3 Fatty Acids  Take 1 capsule daily.   omeprazole 20 MG cap Take  1 capsule  2 x /day    rosuvastatin  5 MG tabl Take  1 tablet  3 x /week  on Mon Wed & Fri  for Cholesterol   timolol (TIMOPTIC) 0.5 %  ophth  soln Place 1 drop into both eyes 2 times daily.      Allergies  Allergen Reactions   Meloxicam Swelling    Weight gain     Past Medical History:  Diagnosis Date   Anal fissure    Arthritis    Carotid stenosis, <50%, bilateral  12/20/2018   Colon polyps    hyperplastic   Diverticulosis    GERD (gastroesophageal reflux disease)    Glaucoma    Hemorrhoids    Hyperlipidemia    Hypertension    Hypothyroidism    T2_NIDDM      Health Maintenance  Topic Date Due   Hepatitis C Screening  Never done   OPHTHALMOLOGY EXAM  04/13/2018   FOOT EXAM  04/02/2020   HEMOGLOBIN A1C  05/17/2020   TETANUS/TDAP  07/17/2020 (Originally 04/25/2017)   MAMMOGRAM  09/10/2020   INFLUENZA VACCINE  09/28/2020   DEXA SCAN  Completed   COVID-19 Vaccine  Completed   PNA vac Low Risk Adult  Completed   HPV VACCINES  Aged Out     Immunization History  Administered Date(s) Administered   Influenza, High Dose  11/25/2016, 11/24/2017, 10/17/2018, 11/14/2018   Influenza-m 12/09/2013, 11/14/2015, 11/23/2016   PFIZER SARS-COV-2 Vacc  05/12/2019, 06/01/2019, 12/16/2019   Pneumococcal -13 04/21/2015   Pneumococcal -23 05/29/2008   Td 04/26/2007    Last Colon - 01/07/2018 - Dr Russella Dar - Negative and recc no f/u due to age   MGM  & dexaBMD - scheduled for  01/06/2023   Past Surgical History:  Procedure Laterality Date   ABDOMINAL HYSTERECTOMY     APPENDECTOMY     BLADDER SUSPENSION     COLONOSCOPY     JOINT REPLACEMENT  2007   rt hip replacement   KNEE ARTHROSCOPY  03/28/2011   Procedure: ARTHROSCOPY KNEE;  Surgeon: Nestor Lewandowsky, MD;  Location: [facility];  Service: Orthopedics;  Laterality: Right;  with partial lateral meniscectomy, Debridement of Chondromalacia     Family History  Problem Relation Age of Onset   Colon polyps Sister    Diabetes Sister    Esophageal cancer Other    Colon cancer Neg Hx    Rectal cancer Neg Hx    Stomach cancer Neg Hx    Breast cancer Neg Hx      Social History   Tobacco Use   Smoking status: Former Smoker    Quit date: 03/22/1996    Years since quitting: 24.3   Smokeless tobacco: Never Used  Vaping Use   Vaping Use: Never used   Alcohol use: Yes    Alcohol/week: 2.0 standard drinks    Types: 2 drink(s) per week    Comment: occ   Drug use: No      ROS Constitutional: Denies fever, chills, weight loss/gain, headaches, insomnia,  night sweats, and change in appetite. Does c/o fatigue. Eyes: Denies redness, blurred vision, diplopia, discharge, itchy, watery eyes.  ENT: Denies discharge, congestion, post nasal drip, epistaxis, sore throat, earache, hearing loss, dental pain, Tinnitus, Vertigo, Sinus pain, snoring.  Cardio: Denies chest pain, palpitations, irregular heartbeat, syncope, dyspnea, diaphoresis, orthopnea, PND, claudication, edema Respiratory: denies cough, dyspnea, DOE, pleurisy, hoarseness, laryngitis, wheezing.  Gastrointestinal: Denies dysphagia, heartburn, reflux, water brash, pain, cramps, nausea, vomiting, bloating, diarrhea, constipation, hematemesis, melena, hematochezia, jaundice, hemorrhoids Genitourinary: Denies dysuria, frequency, urgency, nocturia, hesitancy, discharge, hematuria, flank pain Breast: Breast lumps, nipple discharge, bleeding.   Musculoskeletal: Denies arthralgia, myalgia, stiffness, Jt. Swelling, pain, limp, and strain/sprain.  Denies falls. Skin: Denies puritis, rash, hives, warts, acne, eczema, changing in skin lesion Neuro: No weakness, tremor, incoordination, spasms, paresthesia, pain Psychiatric: Denies confusion, memory loss, sensory loss. Denies Depression. Endocrine: Denies change in weight, skin, hair change, nocturia, and paresthesia, diabetic polys, visual blurring, hyper / hypo glycemic episodes.  Heme/Lymph: No excessive bleeding, bruising, enlarged lymph nodes.  Physical Exam  There were no vitals taken for this visit.  General Appearance: Over  nourished, well groomed and in no apparent distress.  Eyes: PERRLA, EOMs, conjunctiva no swelling or erythema, normal fundi and vessels. Sinuses: No frontal/maxillary tenderness ENT/Mouth: EACs patent / TMs  nl. Nares clear without erythema, swelling, mucoid exudates. Oral hygiene is good. No erythema, swelling, or exudate. Tongue normal, non-obstructing. Tonsils not swollen or erythematous. Hearing normal.  Neck: Supple, thyroid not palpable. No bruits, nodes or JVD. Respiratory: Respiratory effort normal.  BS equal and clear bilateral without rales, rhonci, wheezing or stridor. Cardio: Heart sounds are normal with regular rate and rhythm and no murmurs, rubs or gallops. Peripheral pulses are normal and equal bilaterally without edema. No aortic or femoral bruits. Chest: symmetric with normal excursions and percussion. Breasts: Symmetric, without lumps, nipple discharge, retractions, or fibrocystic changes.  Abdomen: Flat, soft with bowel sounds active. Nontender, no guarding, rebound, hernias, masses, or organomegaly.  Lymphatics: Non tender without lymphadenopathy.  Genitourinary:  Musculoskeletal: Full ROM all peripheral extremities, joint stability, 5/5 strength, and normal gait. Skin: Warm and dry without rashes, lesions, cyanosis, clubbing or   ecchymosis.  Neuro: Cranial nerves intact, reflexes equal bilaterally. Normal muscle tone, no cerebellar symptoms. Sensation intact.  Pysch: Alert and oriented X 3, normal affect, Insight and Judgment appropriate.    Assessment and Plan  1. Annual Preventative Screening Examination   2. Essential hypertension  - EKG 12-Lead - Urinalysis, Routine w reflex microscopic - Microalbumin / creatinine urine ratio - CBC with Differential/Platelet - COMPLETE METABOLIC PANEL WITH GFR - Magnesium - TSH    3. Hyperlipidemia associated with type 2 diabetes mellitus (HCC)  - EKG 12-Lead - Lipid panel - TSH   4. Controlled type 2 diabetes mellitus with stage 2 chronic kidney                                                disease, without long-term current use of insulin (HCC)  - EKG 12-Lead - Urinalysis, Routine w reflex microscopic - Microalbumin / creatinine urine ratio - HM DIABETES FOOT EXAM - PR LOW EXTEMITY NEUR EXAM DOCUM - PTH, intact and calcium - Hemoglobin A1c - Insulin, random   5. Vitamin D deficiency  - VITAMIN D 25 Hydroxy    6. Hypothyroidism,  - TSH   7. Gout  - Uric acid   8. Gastroesophageal reflux disease  - CBC with Differential/Platelet   9. Diabetic peripheral neuropathy (HCC)  - Hemoglobin A1c   10. Morbid obesity (HCC) BMI 35 associated with htn, hyperlipidemia, &  T2_DM   11. Screening for colorectal cancer  - POC Hemoccult Bld/Stl   - TSH   12. Screening for heart disease  - EKG 12-Lead   13. Former smoker  - EKG 12-Lead   14. Medication management  - Urinalysis, Routine w reflex microscopic - Microalbumin / creatinine urine ratio - Uric acid - PTH, intact and calcium - CBC with Differential/Platelet - COMPLETE METABOLIC PANEL WITH  GFR - Magnesium - Lipid panel - TSH - Hemoglobin A1c - Insulin, random - VITAMIN D 25 Hydroxy            Patient was counseled in prudent diet to achieve/maintain BMI less  than 25 for weight control, BP monitoring, regular exercise and medications. Discussed med's effects and SE's. Screening labs and tests as requested with regular follow-up as recommended. Over 40 minutes of exam, counseling, chart review and high complex critical decision making was performed.   Marinus Maw, MD

## 2022-10-02 NOTE — Patient Instructions (Signed)
Due to recent changes in healthcare laws, you Dudash see the results of your imaging and laboratory studies on MyChart before your provider has had a chance to review them.  We understand that in some cases there Gin be results that are confusing or concerning to you. Not all laboratory results come back in the same time frame and the provider Hoback be waiting for multiple results in order to interpret others.  Please give [place] 48 hours in order for your provider to thoroughly review all the results before contacting the office for clarification of your results.   +++++++++++++++++++++++++  Vit D  & Vit C 1,000 mg   are recommended to help protect  against the Covid-19 and other Corona viruses.    Also it's recommended  to take  Zinc 50 mg  to help  protect against the Covid-19   and best place to get  is also on [email or web address]  and don't pay more than 6-8 cents /pill !  ================================ Coronavirus (COVID-19) Are you at risk?  Are you at risk for the Coronavirus (COVID-19)?  To be considered HIGH RISK for Coronavirus (COVID-19), you have to meet the following criteria:  Traveled to [place], [place], [place], [place] or [place]; or in the [place] to [place], [place], [place]  or [place]; and have fever, cough, and shortness of breath within the last 2 weeks of travel OR Been in close contact with a person diagnosed with COVID-19 within the last 2 weeks and have  fever, cough,and shortness of breath  IF YOU DO NOT MEET THESE CRITERIA, YOU ARE CONSIDERED LOW RISK FOR COVID-19.  What to do if you are HIGH RISK for COVID-19?  If you are having a medical emergency, call 911. Seek medical care right away. Before you go to a doctor's office, urgent care or emergency department,  call ahead and tell them about your recent travel, contact with someone diagnosed with COVID-19   and your symptoms.  You should receive instructions from your physician's office regarding next  steps of care.  When you arrive at healthcare provider, tell the healthcare staff immediately you have returned from  visiting [place], [place], [place], [place] or [place]; or traveled in the [place] to [place], [place],  [place] or [place] in the last two weeks or you have been in close contact with a person diagnosed with  COVID-19 in the last 2 weeks.   Tell the health care staff about your symptoms: fever, cough and shortness of breath. After you have been seen by a medical provider, you will be either: Tested for (COVID-19) and discharged home on quarantine except to seek medical care if  symptoms worsen, and asked to  Stay home and avoid contact with others until you get your results (4-5 days)  Avoid travel on public transportation if possible (such as bus, train, or airplane) or Sent to the Emergency Department by EMS for evaluation, COVID-19 testing  and  possible admission depending on your condition and test results.  What to do if you are LOW RISK for COVID-19?  Reduce your risk of any infection by using the same precautions used for avoiding the common cold or flu:  Wash your hands often with soap and warm water for at least 20 seconds.  If soap and water are not readily available,  use an alcohol-based hand sanitizer with at least 60% alcohol.  If coughing or sneezing, cover your mouth and nose by coughing  or sneezing into the elbow areas of your shirt or coat,  into a tissue or into your sleeve (not your hands). Avoid shaking hands with others and consider head nods or verbal greetings only. Avoid touching your eyes, nose, or mouth with unwashed hands.  Avoid close contact with people who are sick. Avoid places or events with large numbers of people in one location, like concerts or sporting events. Carefully consider travel plans you have or are making. If you are planning any travel outside or inside the [place], visit the CDC's Travelers' Health webpage for the  latest health notices. If you have some symptoms but not all symptoms, continue to monitor at home and seek medical attention  if your symptoms worsen. If you are having a medical emergency, call 911.   >>>>>>>>>>>>>>>>>>>>>>>>>>>>>>>>>  We Do NOT Approve of [employer] > > > > > > > > > > > > > > > > > > > > > > > > > > > > > > > > > > > > > > >  Preventive Care for Adults  A healthy lifestyle and preventive care can promote health and wellness. Preventive health guidelines for women include the following key practices. A routine yearly physical is a good way to check with your health care provider about your health and preventive screening. It is a chance to share any concerns and updates on your health and to receive a thorough exam. Visit your dentist for a routine exam and preventive care every 6 months. Brush your teeth twice a day and floss once a day. Good oral hygiene prevents tooth decay and gum disease. The frequency of eye exams is based on your age, health, family medical history, use of contact lenses, and other factors. Follow your health care provider's recommendations for frequency of eye exams. Eat a healthy diet. Foods like vegetables, fruits, whole grains, low-fat dairy products, and lean protein foods contain the nutrients you need without too many calories. Decrease your intake of foods high in solid fats, added sugars, and salt. Eat the right amount of calories for you. Get information about a proper diet from your health care provider, if necessary. Regular physical exercise is one of the most important things you can do for your health. Most adults should get at least 150 minutes of moderate-intensity exercise (any activity that increases your heart rate and causes you to sweat) each week. In addition, most adults need muscle-strengthening exercises on 2 or more days a week. Maintain a healthy weight. The body mass index (BMI) is a screening tool to identify  possible weight problems. It provides an estimate of body fat based on height and weight. Your health care provider can find your BMI and can help you achieve or maintain a healthy weight. For adults 20 years and older: A BMI below 18.5 is considered underweight. A BMI of 18.5 to 24.9 is normal. A BMI of 25 to 29.9 is considered overweight. A BMI of 30 and above is considered obese. Maintain normal blood lipids and cholesterol levels by exercising and minimizing your intake of saturated fat. Eat a balanced diet with plenty of fruit and vegetables. If your lipid or cholesterol levels are high, you are over 50, or you are at high risk for heart disease, you may need your cholesterol levels checked more frequently. Ongoing high lipid and cholesterol levels should be treated with medicines if diet and exercise are not working. If you smoke, find out from   your health care provider how to quit. If you do not use tobacco, do not start. Lung cancer screening is recommended for adults aged 55-80 years who are at high risk for developing lung cancer because of a history of smoking. A yearly low-dose CT scan of the lungs is recommended for people who have at least a 30-pack-year history of smoking and are a current smoker or have quit within the past 15 years. A pack year of smoking is smoking an average of 1 pack of cigarettes a day for 1 year (for example: 1 pack a day for 30 years or 2 packs a day for 15 years). Yearly screening should continue until the smoker has stopped smoking for at least 15 years. Yearly screening should be stopped for people who develop a health problem that would prevent them from having lung cancer treatment. Avoid use of street drugs. Do not share needles with anyone. Ask for help if you need support or instructions about stopping the use of drugs. High blood pressure causes heart disease and increases the risk of stroke.  Ongoing high blood pressure should be treated with medicines if  weight loss and exercise do not work. If you are 55-79 years old, ask your health care provider if you should take aspirin to prevent strokes. Diabetes screening involves taking a blood sample to check your fasting blood sugar level. This should be done once every 3 years, after age 45, if you are within normal weight and without risk factors for diabetes. Testing should be considered at a younger age or be carried out more frequently if you are overweight and have at least 1 risk factor for diabetes. Breast cancer screening is essential preventive care for women. You should practice "breast self-awareness." This means understanding the normal appearance and feel of your breasts and may include breast self-examination. Any changes detected, no matter how small, should be reported to a health care provider. Women in their 20s and 30s should have a clinical breast exam (CBE) by a health care provider as part of a regular health exam every 1 to 3 years. After age 40, women should have a CBE every year. Starting at age 40, women should consider having a mammogram (breast X-ray test) every year. Women who have a family history of breast cancer should talk to their health care provider about genetic screening. Women at a high risk of breast cancer should talk to their health care providers about having an MRI and a mammogram every year. Breast cancer gene (BRCA)-related cancer risk assessment is recommended for women who have family members with BRCA-related cancers. BRCA-related cancers include breast, ovarian, tubal, and peritoneal cancers. Having family members with these cancers may be associated with an increased risk for harmful changes (mutations) in the breast cancer genes BRCA1 and BRCA2. Results of the assessment will determine the need for genetic counseling and BRCA1 and BRCA2 testing. Routine pelvic exams to screen for cancer are no longer recommended for nonpregnant women who are considered low risk for  cancer of the pelvic organs (ovaries, uterus, and vagina) and who do not have symptoms. Ask your health care provider if a screening pelvic exam is right for you. If you have had past treatment for cervical cancer or a condition that could lead to cancer, you need Pap tests and screening for cancer for at least 20 years after your treatment. If Pap tests have been discontinued, your risk factors (such as having a new sexual partner) need to be   reassessed to determine if screening should be resumed. Some women have medical problems that increase the chance of getting cervical cancer. In these cases, your health care provider may recommend more frequent screening and Pap tests.  Colorectal cancer can be detected and often prevented. Most routine colorectal cancer screening begins at the age of 9 years and continues through age 47 years. However, your health care provider may recommend screening at an earlier age if you have risk factors for colon cancer. On a yearly basis, your health care provider may provide home test kits to check for hidden blood in the stool. Use of a small camera at the end of a tube, to directly examine the colon (sigmoidoscopy or colonoscopy), can detect the earliest forms of colorectal cancer. Talk to your health care provider about this at age 66, when routine screening begins.  Direct exam of the colon should be repeated every 5-10 years through age 98 years, unless early forms of pre-cancerous polyps or small growths are found. Osteoporosis is a disease in which the bones lose minerals and strength with aging. This can result in serious bone fractures or breaks. The risk of osteoporosis can be identified using a bone density scan. Women ages 48 years and over and women at risk for fractures or osteoporosis should discuss screening with their health care providers. Ask your health care provider whether you should take a calcium supplement or vitamin D to reduce the rate of  osteoporosis. Menopause can be associated with physical symptoms and risks. Hormone replacement therapy is available to decrease symptoms and risks. You should talk to your health care provider about whether hormone replacement therapy is right for you. Use sunscreen. Apply sunscreen liberally and repeatedly throughout the day. You should seek shade when your shadow is shorter than you. Protect yourself by wearing long sleeves, pants, a wide-brimmed hat, and sunglasses year round, whenever you are outdoors. Once a month, do a whole body skin exam, using a mirror to look at the skin on your back. Tell your health care provider of new moles, moles that have irregular borders, moles that are larger than a pencil eraser, or moles that have changed in shape or color. Stay current with required vaccines (immunizations). Influenza vaccine. All adults should be immunized every year. Tetanus, diphtheria, and acellular pertussis (Td, Tdap) vaccine. Pregnant women should receive 1 dose of Tdap vaccine during each pregnancy. The dose should be obtained regardless of the length of time since the last dose. Immunization is preferred during the 27th-36th week of gestation. An adult who has not previously received Tdap or who does not know her vaccine status should receive 1 dose of Tdap. This initial dose should be followed by tetanus and diphtheria toxoids (Td) booster doses every 10 years. Adults with an unknown or incomplete history of completing a 3-dose immunization series with Td-containing vaccines should begin or complete a primary immunization series including a Tdap dose. Adults should receive a Td booster every 10 years.  Zoster vaccine. One dose is recommended for adults aged 47 years or older unless certain conditions are present.  Pneumococcal 13-valent conjugate (PCV13) vaccine. When indicated, a person who is uncertain of her immunization history and has no record of immunization should receive the PCV13  vaccine. An adult aged 63 years or older who has certain medical conditions and has not been previously immunized should receive 1 dose of PCV13 vaccine. This PCV13 should be followed with a dose of pneumococcal polysaccharide (PPSV23) vaccine. The PPSV23  vaccine dose should be obtained at least 1 or more year(s) after the dose of PCV13 vaccine. An adult aged 19 years or older who has certain medical conditions and previously received 1 or more doses of PPSV23 vaccine should receive 1 dose of PCV13. The PCV13 vaccine dose should be obtained 1 or more years after the last PPSV23 vaccine dose.  Pneumococcal polysaccharide (PPSV23) vaccine. When PCV13 is also indicated, PCV13 should be obtained first. All adults aged 65 years and older should be immunized. An adult younger than age 65 years who has certain medical conditions should be immunized. Any person who resides in a nursing home or long-term care facility should be immunized. An adult smoker should be immunized. People with an immunocompromised condition and certain other conditions should receive both PCV13 and PPSV23 vaccines. People with human immunodeficiency virus (HIV) infection should be immunized as soon as possible after diagnosis. Immunization during chemotherapy or radiation therapy should be avoided. Routine use of PPSV23 vaccine is not recommended for American Indians, [place] Natives, or people younger than 65 years unless there are medical conditions that require PPSV23 vaccine. When indicated, people who have unknown immunization and have no record of immunization should receive PPSV23 vaccine. One-time revaccination 5 years after the first dose of PPSV23 is recommended for people aged 19-64 years who have chronic kidney failure, nephrotic syndrome, asplenia, or immunocompromised conditions. People who received 1-2 doses of PPSV23 before age 65 years should receive another dose of PPSV23 vaccine at age 65 years or later if at least 5 years have  passed since the previous dose. Doses of PPSV23 are not needed for people immunized with PPSV23 at or after age 65 years.  Preventive Services / Frequency  Ages 65 years and over Blood pressure check. Lipid and cholesterol check. Lung cancer screening. / Every year if you are aged 55-80 years and have a 30-pack-year history of smoking and currently smoke or have quit within the past 15 years. Yearly screening is stopped once you have quit smoking for at least 15 years or develop a health problem that would prevent you from having lung cancer treatment. Clinical breast exam.** / Every year after age 40 years.  BRCA-related cancer risk assessment.** / For women who have family members with a BRCA-related cancer (breast, ovarian, tubal, or peritoneal cancers). Mammogram.** / Every year beginning at age 40 years and continuing for as long as you are in good health. Consult with your health care provider. Pap test.** / Every 3 years starting at age 30 years through age 65 or 70 years with 3 consecutive normal Pap tests. Testing can be stopped between 65 and 70 years with 3 consecutive normal Pap tests and no abnormal Pap or HPV tests in the past 10 years. Fecal occult blood test (FOBT) of stool. / Every year beginning at age 50 years and continuing until age 75 years. You may not need to do this test if you get a colonoscopy every 10 years. Flexible sigmoidoscopy or colonoscopy.** / Every 5 years for a flexible sigmoidoscopy or every 10 years for a colonoscopy beginning at age 50 years and continuing until age 75 years. Hepatitis C blood test.** / For all people born from 1945 through 1965 and any individual with known risks for hepatitis C. Osteoporosis screening.** / A one-time screening for women ages 65 years and over and women at risk for fractures or osteoporosis. Skin self-exam. / Monthly. Influenza vaccine. / Every year. Tetanus, diphtheria, and acellular pertussis (Tdap/Td) vaccine.** /   1 dose  of Td every 10 years. Zoster vaccine.** / 1 dose for adults aged 60 years or older. Pneumococcal 13-valent conjugate (PCV13) vaccine.** / Consult your health care provider. Pneumococcal polysaccharide (PPSV23) vaccine.** / 1 dose for all adults aged 65 years and older. Screening for abdominal aortic aneurysm (AAA)  by ultrasound is recommended for people who have history of high blood pressure or who are current or former smokers. ++++++++++++++++++++ Recommend Adult Low Dose Aspirin or  coated  Aspirin 81 mg daily  To reduce risk of Colon Cancer 40 %,  Skin Cancer 26 % ,  Melanoma 46%  and  Pancreatic cancer 60% ++++++++++++++++++++ Vitamin D goal  is between 70-100.  Please make sure that you are taking your Vitamin D as directed.  It is very important as a natural anti-inflammatory  helping hair, skin, and nails, as well as reducing stroke and heart attack risk.  It helps your bones and helps with mood. It also decreases numerous cancer risks so please take it as directed.  Low Vit D is associated with a 200-300% higher risk for CANCER  and 200-300% higher risk for HEART   ATTACK  &  STROKE.   ...................................... It is also associated with higher death rate at younger ages,  autoimmune diseases like Rheumatoid arthritis, Lupus, Multiple Sclerosis.    Also many other serious conditions, like depression, Alzheimer's Dementia, infertility, muscle aches, fatigue, fibromyalgia - just to name a few. ++++++++++++++++++ Recommend the book "The END of DIETING" by Dr Joel Fuhrman  & the book "The END of DIABETES " by Dr Joel Fuhrman At [employer] - get book & Audio CD's    Being diabetic has a  300% increased risk for heart attack, stroke, cancer, and alzheimer- type vascular dementia. It is very important that you work harder with diet by avoiding all foods that are white. Avoid white rice (brown & wild rice is OK), white potatoes (sweetpotatoes in moderation is OK),  White bread or wheat bread or anything made out of white flour like bagels, donuts, rolls, buns, biscuits, cakes, pastries, cookies, pizza crust, and pasta (made from white flour & egg whites) - vegetarian pasta or spinach or wheat pasta is OK. Multigrain breads like Arnold's or [employer], or multigrain sandwich thins or flatbreads.  Diet, exercise and weight loss can reverse and cure diabetes in the early stages.  Diet, exercise and weight loss is very important in the control and prevention of complications of diabetes which affects every system in your body, ie. Brain - dementia/stroke, eyes - glaucoma/blindness, heart - heart attack/heart failure, kidneys - dialysis, stomach - gastric paralysis, intestines - malabsorption, nerves - severe painful neuritis, circulation - gangrene & loss of a leg(s), and finally cancer and Alzheimers.    I recommend avoid fried & greasy foods,  sweets/candy, white rice (brown or wild rice or Quinoa is OK), white potatoes (sweet potatoes are OK) - anything made from white flour - bagels, doughnuts, rolls, buns, biscuits,white and wheat breads, pizza crust and traditional pasta made of white flour & egg white(vegetarian pasta or spinach or wheat pasta is OK).  Multi-grain bread is OK - like multi-grain flat bread or sandwich thins. Avoid alcohol in excess. Exercise is also important.    Eat all the vegetables you want - avoid meat, especially red meat and dairy - especially cheese.  Cheese is the most concentrated form of trans-fats which is the worst thing to clog up our arteries. Veggie cheese is OK   which can be found in the fresh produce section at [employer] or [employer] or [employer]  +++++++++++++++++++ DASH Eating Plan  DASH stands for "Dietary Approaches to Stop Hypertension."   The DASH eating plan is a healthy eating plan that has been shown to reduce high blood pressure (hypertension). Additional health benefits may include reducing the risk of type 2  diabetes mellitus, heart disease, and stroke. The DASH eating plan may also help with weight loss. WHAT DO I NEED TO KNOW ABOUT THE DASH EATING PLAN? For the DASH eating plan, you will follow these general guidelines: Choose foods with a percent daily value for sodium of less than 5% (as listed on the food label). Use salt-free seasonings or herbs instead of table salt or sea salt. Check with your health care provider or pharmacist before using salt substitutes. Eat lower-sodium products, often labeled as "lower sodium" or "no salt added." Eat fresh foods. Eat more vegetables, fruits, and low-fat dairy products. Choose whole grains. Look for the word "whole" as the first word in the ingredient list. Choose fish  Limit sweets, desserts, sugars, and sugary drinks. Choose heart-healthy fats. Eat veggie cheese  Eat more home-cooked food and less restaurant, buffet, and fast food. Limit fried foods. Cook foods using methods other than frying. Limit canned vegetables. If you do use them, rinse them well to decrease the sodium. When eating at a restaurant, ask that your food be prepared with less salt, or no salt if possible.                      WHAT FOODS CAN I EAT? Read Dr Joel Fuhrman's books on The End of Dieting & The End of Diabetes  Grains Whole grain or whole wheat bread. Brown rice. Whole grain or whole wheat pasta. Quinoa, bulgur, and whole grain cereals. Low-sodium cereals. Corn or whole wheat flour tortillas. Whole grain cornbread. Whole grain crackers. Low-sodium crackers.  Vegetables Fresh or frozen vegetables (raw, steamed, roasted, or grilled). Low-sodium or reduced-sodium tomato and vegetable juices. Low-sodium or reduced-sodium tomato sauce and paste. Low-sodium or reduced-sodium canned vegetables.   Fruits All fresh, canned (in natural juice), or frozen fruits.  Protein Products  All fish and seafood.  Dried beans, peas, or lentils. Unsalted nuts and seeds. Unsalted  canned beans.  Dairy Low-fat dairy products, such as skim or 1% milk, 2% or reduced-fat cheeses, low-fat ricotta or cottage cheese, or plain low-fat yogurt. Low-sodium or reduced-sodium cheeses.  Fats and Oils Tub margarines without trans fats. Light or reduced-fat mayonnaise and salad dressings (reduced sodium). Avocado. Safflower, olive, or canola oils. Natural peanut or almond butter.  Other Unsalted popcorn and pretzels. The items listed above may not be a complete list of recommended foods or beverages. Contact your dietitian for more options.  +++++++++++++++  WHAT FOODS ARE NOT RECOMMENDED? Grains/ White flour or wheat flour White bread. White pasta. White rice. Refined cornbread. Bagels and croissants. Crackers that contain trans fat.  Vegetables  Creamed or fried vegetables. Vegetables in a . Regular canned vegetables. Regular canned tomato sauce and paste. Regular tomato and vegetable juices.  Fruits Dried fruits. Canned fruit in light or heavy syrup. Fruit juice.  Meat and Other Protein Products Meat in general - RED meat & White meat.  Fatty cuts of meat. Ribs, chicken wings, all processed meats as bacon, sausage, bologna, salami, fatback, hot dogs, bratwurst and packaged luncheon meats.  Dairy Whole or 2% milk, cream, half-and-half, and cream cheese.   Whole-fat or sweetened yogurt. Full-fat cheeses or blue cheese. Non-dairy creamers and whipped toppings. Processed cheese, cheese spreads, or cheese curds.  Condiments Onion and garlic salt, seasoned salt, table salt, and sea salt. Canned and packaged gravies. Worcestershire sauce. Tartar sauce. Barbecue sauce. Teriyaki sauce. Soy sauce, including reduced sodium. Steak sauce. Fish sauce. Oyster sauce. Cocktail sauce. Horseradish. Ketchup and mustard. Meat flavorings and tenderizers. Bouillon cubes. Hot sauce. Tabasco sauce. Marinades. Taco seasonings. Relishes.  Fats and Oils Butter, stick margarine, lard, shortening and  bacon fat. Coconut, palm kernel, or palm oils. Regular salad dressings.  Pickles and olives. Salted popcorn and pretzels.  The items listed above may not be a complete list of foods and beverages to avoid.   

## 2022-10-03 ENCOUNTER — Encounter: Payer: Self-pay | Admitting: Internal Medicine

## 2022-10-03 ENCOUNTER — Ambulatory Visit (INDEPENDENT_AMBULATORY_CARE_PROVIDER_SITE_OTHER): Payer: Medicare HMO | Admitting: Internal Medicine

## 2022-10-03 VITALS — BP 120/64 | HR 55 | Temp 97.9°F | Resp 16 | Ht 67.5 in | Wt 203.6 lb

## 2022-10-03 DIAGNOSIS — Z0000 Encounter for general adult medical examination without abnormal findings: Secondary | ICD-10-CM | POA: Diagnosis not present

## 2022-10-03 DIAGNOSIS — E1122 Type 2 diabetes mellitus with diabetic chronic kidney disease: Secondary | ICD-10-CM

## 2022-10-03 DIAGNOSIS — N182 Chronic kidney disease, stage 2 (mild): Secondary | ICD-10-CM | POA: Diagnosis not present

## 2022-10-03 DIAGNOSIS — Z79899 Other long term (current) drug therapy: Secondary | ICD-10-CM | POA: Diagnosis not present

## 2022-10-03 DIAGNOSIS — E039 Hypothyroidism, unspecified: Secondary | ICD-10-CM

## 2022-10-03 DIAGNOSIS — Z87891 Personal history of nicotine dependence: Secondary | ICD-10-CM

## 2022-10-03 DIAGNOSIS — E1142 Type 2 diabetes mellitus with diabetic polyneuropathy: Secondary | ICD-10-CM

## 2022-10-03 DIAGNOSIS — Z136 Encounter for screening for cardiovascular disorders: Secondary | ICD-10-CM

## 2022-10-03 DIAGNOSIS — E559 Vitamin D deficiency, unspecified: Secondary | ICD-10-CM

## 2022-10-03 DIAGNOSIS — E1169 Type 2 diabetes mellitus with other specified complication: Secondary | ICD-10-CM

## 2022-10-03 DIAGNOSIS — Z1211 Encounter for screening for malignant neoplasm of colon: Secondary | ICD-10-CM

## 2022-10-03 DIAGNOSIS — I10 Essential (primary) hypertension: Secondary | ICD-10-CM | POA: Diagnosis not present

## 2022-10-03 DIAGNOSIS — Z0001 Encounter for general adult medical examination with abnormal findings: Secondary | ICD-10-CM

## 2022-10-03 DIAGNOSIS — K219 Gastro-esophageal reflux disease without esophagitis: Secondary | ICD-10-CM | POA: Diagnosis not present

## 2022-10-03 DIAGNOSIS — M109 Gout, unspecified: Secondary | ICD-10-CM

## 2022-10-03 DIAGNOSIS — E785 Hyperlipidemia, unspecified: Secondary | ICD-10-CM | POA: Diagnosis not present

## 2022-10-03 MED ORDER — PHENTERMINE HCL 37.5 MG PO TABS
ORAL_TABLET | ORAL | 0 refills | Status: DC
Start: 2022-10-03 — End: 2023-01-06

## 2022-10-04 NOTE — Progress Notes (Signed)
^<^<^<^<^<^<^<^<^<^<^<^<^<^<^<^<^<^<^<^<^<^<^<^<^<^<^<^<^<^<^<^<^<^<^<^<^ ^>^>^>^>^>^>^>^>^>^>^>>^>^>^>^>^>^>^>^>^>^>^>^>^>^>^>^>^>^>^>^>^>^>^>^>^>  - Chol = 207 - is " OK " since have such a very high level                                                                of "Good" HDL Chol = 96  - Wonderful   ^<^<^<^<^<^<^<^<^<^<^<^<^<^<^<^<^<^<^<^<^<^<^<^<^<^<^<^<^<^<^<^<^<^<^<^<^ ^>^>^>^>^>^>^>^>^>^>^>^>^>^>^>^>^>^>^>^>^>^>^>^>^>^>^>^>^>^>^>^>^>^>^>^>^  -  A1c = 6.1%  Blood sugar and A1c are  STILL elevated in the borderline and                                                                early or pre-diabetes range which has the same   300% increased risk for heart attack, stroke, cancer and                                                 alzheimer- type vascular dementia as full blown diabetes.   But the good news is that diet, exercise with weight loss can                                                                                    cure the early diabetes at this point.  -  It is very important that you work harder with diet by                                     avoiding all foods that are white except chicken, fish & calliflower.  - Avoid white rice  (brown & wild rice is OK),   - Avoid white potatoes  (sweet potatoes in moderation is OK),   White bread or wheat bread or anything made out of                                       white flour like bagels, donuts, rolls, buns, biscuits, cakes,  - pastries, cookies, pizza crust, and pasta (made from  white flour & egg whites)   - vegetarian pasta or spinach or wheat pasta is OK.  - Multigrain breads like Arnold's, Pepperidge Farm or   multigrain sandwich thins or high fiber breads like                    [place]ka bread or "Dave's Killer" breads that are  4 to 5 grams fiber per slice !  are best.    Diet, exercise and weight loss can reverse and cure  diabetes in the early stages.    ^<^<^<^<^<^<^<^<^<^<^<^<^<^<^<^<^<^<^<^<^<^<^<^<^<^<^<^<^<^<^<^<^<^<^<^<^ ^>^>^>^>^>^>^>^>^>^>^>^>^>^>^>^>^>^>^>^>^>^>^>^>^>^>^>^>^>^>^>^>^>^>^>^>^  -  Vitamin D = 79 - Excellent   p- Please keep dosage same   ^<^<^<^<^<^<^<^<^<^<^<^<^<^<^<^<^<^<^<^<^<^<^<^<^<^<^<^<^<^<^<^<^<^<^<^<^ ^>^>^>^>^>^>^>^>^>^>^>^>^>^>^>^>^>^>^>^>^>^>^>^>^>^>^>^>^>^>^>^>^>^>^>^>^  -  Uric acid / Gout test is Normal - Please continue Allopurinol   ^<^<^<^<^<^<^<^<^<^<^<^<^<^<^<^<^<^<^<^<^<^<^<^<^<^<^<^<^<^<^<^<^<^<^<^<^ ^>^>^>^>^>^>^>^>^>^>^>^>^>^>^>^>^>^>^>^>^>^>^>^>^>^>^>^>^>^>^>^>^>^>^>^>^  -  All Else - CBC - Kidneys - Electrolytes - Liver - Magnesium & Thyroid    - all  Normal / OK  ^<^<^<^<^<^<^<^<^<^<^<^<^<^<^<^<^<^<^<^<^<^<^<^<^<^<^<^<^<^<^<^<^<^<^<^<^ ^>^>^>^>^>^>^>^>^>^>^>^>^>^>^>^>^>^>^>^>^>^>^>^>^>^>^>^>^>^>^>^>^>^>^>^>^

## 2022-11-01 ENCOUNTER — Other Ambulatory Visit: Payer: Self-pay

## 2022-11-01 DIAGNOSIS — Z1212 Encounter for screening for malignant neoplasm of rectum: Secondary | ICD-10-CM | POA: Diagnosis not present

## 2022-11-01 DIAGNOSIS — Z1211 Encounter for screening for malignant neoplasm of colon: Secondary | ICD-10-CM

## 2022-11-01 LAB — POC HEMOCCULT BLD/STL (HOME/3-CARD/SCREEN)
Card #2 Fecal Occult Blod, POC: NEGATIVE
Card #3 Fecal Occult Blood, POC: NEGATIVE
Fecal Occult Blood, POC: NEGATIVE

## 2022-11-04 ENCOUNTER — Other Ambulatory Visit: Payer: Self-pay | Admitting: Nurse Practitioner

## 2022-11-04 DIAGNOSIS — E1169 Type 2 diabetes mellitus with other specified complication: Secondary | ICD-10-CM

## 2023-01-03 ENCOUNTER — Ambulatory Visit (INDEPENDENT_AMBULATORY_CARE_PROVIDER_SITE_OTHER): Payer: Medicare HMO | Admitting: Nurse Practitioner

## 2023-01-03 ENCOUNTER — Encounter: Payer: Self-pay | Admitting: Nurse Practitioner

## 2023-01-03 VITALS — BP 120/68 | HR 71 | Temp 97.8°F | Ht 67.5 in | Wt 206.0 lb

## 2023-01-03 DIAGNOSIS — E1122 Type 2 diabetes mellitus with diabetic chronic kidney disease: Secondary | ICD-10-CM | POA: Diagnosis not present

## 2023-01-03 DIAGNOSIS — E039 Hypothyroidism, unspecified: Secondary | ICD-10-CM | POA: Diagnosis not present

## 2023-01-03 DIAGNOSIS — E1169 Type 2 diabetes mellitus with other specified complication: Secondary | ICD-10-CM | POA: Diagnosis not present

## 2023-01-03 DIAGNOSIS — N182 Chronic kidney disease, stage 2 (mild): Secondary | ICD-10-CM

## 2023-01-03 DIAGNOSIS — Z79899 Other long term (current) drug therapy: Secondary | ICD-10-CM

## 2023-01-03 DIAGNOSIS — K219 Gastro-esophageal reflux disease without esophagitis: Secondary | ICD-10-CM

## 2023-01-03 DIAGNOSIS — Z9229 Personal history of other drug therapy: Secondary | ICD-10-CM | POA: Diagnosis not present

## 2023-01-03 DIAGNOSIS — E6609 Other obesity due to excess calories: Secondary | ICD-10-CM

## 2023-01-03 DIAGNOSIS — E1142 Type 2 diabetes mellitus with diabetic polyneuropathy: Secondary | ICD-10-CM

## 2023-01-03 DIAGNOSIS — E559 Vitamin D deficiency, unspecified: Secondary | ICD-10-CM

## 2023-01-03 DIAGNOSIS — N951 Menopausal and female climacteric states: Secondary | ICD-10-CM | POA: Diagnosis not present

## 2023-01-03 DIAGNOSIS — E785 Hyperlipidemia, unspecified: Secondary | ICD-10-CM

## 2023-01-03 DIAGNOSIS — H401131 Primary open-angle glaucoma, bilateral, mild stage: Secondary | ICD-10-CM

## 2023-01-03 DIAGNOSIS — Z6830 Body mass index (BMI) 30.0-30.9, adult: Secondary | ICD-10-CM

## 2023-01-03 DIAGNOSIS — M1990 Unspecified osteoarthritis, unspecified site: Secondary | ICD-10-CM

## 2023-01-03 DIAGNOSIS — E66811 Obesity, class 1: Secondary | ICD-10-CM

## 2023-01-03 DIAGNOSIS — G4709 Other insomnia: Secondary | ICD-10-CM

## 2023-01-03 DIAGNOSIS — I10 Essential (primary) hypertension: Secondary | ICD-10-CM

## 2023-01-03 MED ORDER — MIRABEGRON ER 50 MG PO TB24
50.0000 mg | ORAL_TABLET | Freq: Every day | ORAL | 5 refills | Status: DC
Start: 2023-01-03 — End: 2023-05-18

## 2023-01-03 NOTE — Patient Instructions (Signed)

## 2023-01-03 NOTE — Progress Notes (Signed)
FOLLOW UP  Assessment:   Essential hypertension Discussed DASH (Dietary Approaches to Stop Hypertension) DASH diet is lower in sodium than a typical American diet. Cut back on foods that are high in saturated fat, cholesterol, and trans fats. Eat more whole-grain foods, fish, poultry, and nuts Remain active and exercise as tolerated daily.  Monitor BP at home-Call if greater than 130/80.  Check CMP/CBC  Hyperlipidemia associated with T2DM (HCC) Discussed lifestyle modifications. Recommended diet heavy in fruits and veggies, omega 3's. Decrease consumption of animal meats, cheeses, and dairy products. Remain active and exercise as tolerated. Continue to monitor. Check lipids/TSH  Gastroesophageal reflux disease, esophagitis presence not specified No suspected reflux complications (Barret/stricture). Lifestyle modification:  wt loss, avoid meals 2-3h before bedtime. Consider eliminating food triggers:  chocolate, caffeine, EtOH, acid/spicy food.  Diet controlled T2DM [facility])  Education: Reviewed 'ABCs' of diabetes management  Discussed goals to be met and/or maintained include A1C (<7) Blood pressure (<130/80) Cholesterol (LDL <70) Continue Eye Exam yearly  Continue Dental Exam Q6 mo Discussed dietary recommendations Discussed Physical Activity recommendations Check A1C  CKD II associated with T2DM (HCC) Discussed how what you eat and drink can aide in kidney protection. Stay well hydrated. Avoid high salt foods. Avoid NSAIDS. Keep BP and BG well controlled.   Take medications as prescribed. Remain active and exercise as tolerated daily. Maintain weight.  Continue to monitor. Check CMP/GFR/Microablumin  Hypothyroidism, unspecified type Controlled. Continue Levothyroxine. Reminded to take on an empty stomach 30-70mins before food.  Stop any Biotin Supplement 48-72 hours before next TSH level to reduce the risk of falsely low TSH levels. Continue to monitor.     Peripheral sensory neuropathy, diabetic (HCC) Control glucose, do daily food checks Gabapentin PRN   Osteoarthritis, unspecified osteoarthritis type, unspecified site S/P TKR Right. Followed by Dr. Turner Daniels RICE when flared Stay well hydrated  Obesity BMI 30 with diabetes, htn, hyperlipidemia Discussed appropriate BMI Diet modification. Physical activity. Encouraged/praised to build confidence.  Vitamin D deficiency Continue supplement for goal of 60-100 Monitor Vitamin D levels  Medication management All medications discussed and reviewed in full. All questions and concerns regarding medications addressed.    Glaucoma Dr. Silas Flood following - last 12/23 Continue eye gtts   Insomnia  Discussed good sleep hygiene. Establish bed and wake times. Sleep restriction-only sleep estimated hrs sleep. Bed only for sleep, only sleep when sleepy, out of bed if anxious (stimulus control). Reviewed relaxation techniques, mindful meditations. Expected sleep duration. Addressed worries about not sleeping.   Urinary frequency/ OAB Continue pelvic floor exercises. Continue to monitor  Climacteric/Hot Flashes/Post menopausal Restarted HRT Estradiol -  monitor levels   Orders Placed This Encounter  Procedures   CBC with Differential/Platelet   COMPLETE METABOLIC PANEL WITH GFR   Lipid panel   Hemoglobin A1c   Estrogens, total   TSH   Notify office for further evaluation and treatment, questions or concerns if any reported s/s fail to improve.   The patient was advised to call back or seek an in-person evaluation if any symptoms worsen or if the condition fails to improve as anticipated.   Further disposition pending results of labs. Discussed med's effects and SE's.    I discussed the assessment and treatment plan with the patient. The patient was provided an opportunity to ask questions and all were answered. The patient agreed with the plan and demonstrated an  understanding of the instructions.  Discussed med's effects and SE's. Screening labs and tests as requested with  regular follow-up as recommended.  I provided 35 minutes of face-to-face time during this encounter including counseling, chart review, and critical decision making was preformed.  Today's Plan of Care is based on a patient-centered health care approach known as shared decision making - the decisions, tests and treatments allow for patient preferences and values to be balanced with clinical evidence.     Future Appointments  Date Time Provider Department Center  01/16/2023  2:00 PM [facility] MM 2 [facility]  01/16/2023  2:30 PM [facility] DX DEXA 1 [facility]  03/23/2023 11:00 AM Adela Glimpse, NP [facility] None  07/04/2023 11:30 AM [name], MD [facility] None  10/16/2023 11:00 AM [name], MD [facility] None    Subjective:   Rebecca Graves is a 82 y.o. female who presents for a 3 month follow up on HTN, T2 diabetes with CKD and neuropathy, hyperlipidemia, vitamin D def. 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  Overall she reports feeling well today.  She has no new concerns at this time.  She is established with Dr. Turner Daniels, s/p R TKA 03/2021.  Reports doing well overall.  GERD- managing at this time, taking omeprazole daily.  Hx hysterectomy and  bladder suspension remotely, no recent pelvic, she notes in the last 2-3 years has noted increased urinary frequency, some urgency with leaking bladder, urinates every hour or so, gets up 3-4 times a night. Continues to do pelvic floor exercises.  She has restarted Estradiol and wishes to monitor her estrogen level today.  This has helped improve overall symptoms.   BMI is Body mass index is 31.79 kg/m., she has been working on diet and exercise.  Works daily in garden.  Plans to restart Phentermine. Wt Readings from Last 3 Encounters:  01/03/23 206 lb (93.4 kg)  10/03/22 203 lb 9.6 oz (92.4 kg)  08/11/22 202 lb (91.6 kg)   Her blood pressure has been controlled at home, today their BP is BP: 120/68 She does workout. She denies chest pain, shortness of breath, dizziness.   She is on cholesterol medication (on rosuvastatin 5 mg three days a week) and reports increase myalgias most notable BLE, bilateral calves and bilateral hands. Her cholesterol is at goal. The cholesterol last visit was:   Lab Results  Component Value Date   CHOL 207 (H) 10/03/2022   HDL 96 10/03/2022   LDLCALC 92 10/03/2022   TRIG 94 10/03/2022   CHOLHDL 2.2 10/03/2022   She has been working on diet and exercise for Diet controlled T2DM with CKD 2 and neuropathy.  She has cut out soda, switched to diet tea, she is on ASA and she is not on ACE - declines at this time  Denies increased appetite, nausea, polydipsia, polyuria and vomiting.  She does have neuropathy, does well during the day, using gabapentin at night.  She checks occasional fasting sugars, typically <120, rare up to 130.  Last A1C in the office was:  Lab Results  Component Value Date   HGBA1C 6.1 (H) 10/03/2022  She has been pushing fluid intake:   Lab Results  Component Value Date   GFRAA 104 07/09/2020   Patient is on Vitamin D supplement and at goal at recent check:  Lab Results  Component Value Date   VD25OH 79 10/03/2022     She is on  thyroid medication. Her medication was not changed last visit, taking 100 mcg tab, 1 tab daily on empty stomach. Patient denies nervousness, palpitations and weight changes.  Lab Results  Component Value Date   TSH 4.36 10/03/2022     Medication Review Current Outpatient Medications on File Prior to Visit  Medication Sig Dispense Refill   acetaminophen (TYLENOL) 500 MG tablet Take 1,000 mg by mouth every 6 (six) hours as needed for moderate pain.     Alcohol Swabs (B-D SINGLE USE SWABS REGULAR) PADS Use 1 swab prior to checking blood sugar daily-DX-E11.9 100 each 3   amitriptyline (ELAVIL) 25 MG tablet Take  1 tablet   3 x /day  for Muscle Cramps or Pain (Patient taking differently: as needed. Take  1 tablet   3 x /day  for Muscle Cramps or Pain) 100 tablet 0   aspirin EC 81 MG tablet Take 1 tablet (81 mg total) by mouth 2 (two) times daily. (Patient taking differently: Take 81 mg by mouth daily.) 60 tablet 0   bisoprolol-hydrochlorothiazide (ZIAC) 10-6.25 MG tablet Take 1 tablet Daily for  BP (Patient taking differently: Take 1/2 tablet Daily for  BP) 90 tablet 3   Blood Glucose Calibration (TRUE METRIX LEVEL 1) Low SOLN Check blood sugar 1 time daily-DX-E11.9. 3 each 3   blood glucose meter kit and supplies KIT Test blood sugar once daily. 1 each 0   Blood Glucose Monitoring Suppl (TRUE METRIX METER) w/Device KIT Check blood sugar 1 time daily-DX-E11.9 1 kit 0   Cholecalciferol (VITAMIN D) 50 MCG (2000 UT) tablet Take 4,000 Units by mouth daily.     docusate sodium (COLACE) 100 MG capsule Take 100 mg by mouth 2 (two) times daily.     estradiol (ESTRACE) 0.5 MG tablet Take  1 tablet   Daily  to Prevent Hot Flashes & Night Sweats 90 tablet 3   furosemide (LASIX) 40 MG tablet TAKE 1 TABLET BY MOUTH  DAILY FOR BLOOD PRESSURE  AND FLUID RETENTION Lynnda Child  SWELLING 90 tablet 3   gabapentin (NEURONTIN) 600 MG tablet TAKE 1/2 TO 1 TABLET BY  MOUTH 2 TO 3 TIMES DAILY AS NEEDED FOR PAIN 270 tablet 3    glucose blood test strip Check blood sugar once daily 100 each 3   IRON, FERROUS SULFATE, PO Take by mouth.     Lancets MISC Test blood sugar once daily. 200 each 11   latanoprost (XALATAN) 0.005 % ophthalmic solution Place 1 drop into both eyes at bedtime.     levothyroxine (SYNTHROID) 100 MCG tablet Take  1 tablet  Daily  on an empty stomach with only water for 30 minutes & no Antacid meds, Calcium or Magnesium for 4 hours & avoid Biotin 90 tablet 3   magnesium oxide (MAG-OX) 400 MG tablet Take 400 mg by mouth 2 (two) times daily.     metFORMIN (GLUCOPHAGE-XR) 500 MG 24 hr tablet TAKE 2 TABLETS TWICE A DAY WITH MEALS FOR DIABETES    MELLITUS. (Patient taking differently: Take one tablet BID) 360 tablet 0   omeprazole (PRILOSEC) 20 MG capsule TAKE 1 CAPSULE BY MOUTH  TWICE DAILY (EVERY 12  HOURS) TO PREVENT  HEARTBURN AND INDIGESTION 180 capsule 3   Trypsin-Castor Oil-[place] Balsam (OPTASE EX) Place 1 application into both eyes daily.     vitamin B-12 (CYANOCOBALAMIN) 1000 MCG tablet Take 1,000 mcg by mouth daily.     Zinc 50 MG TABS Take 25 mg by mouth.     atorvastatin (LIPITOR) 10 MG tablet TAKE 1 TABLET BY MOUTH ON MONDAY, WEDNESDAY AND FRIDAY (Patient not taking: Reported on 01/03/2023) 36 tablet 2   Biotin w/ Vitamins C & E (HAIR SKIN & NAILS GUMMIES PO) Take 1 tablet by mouth in the morning and at bedtime. (Patient not taking: Reported on 01/03/2023)     phentermine (ADIPEX-P) 37.5 MG tablet Take 1/2 to 1 tablet every Morning for Dieting & Weight Loss (Patient not taking: Reported on 01/03/2023) 90 tablet 0   topiramate (TOPAMAX) 50 MG tablet TAKE 1 TO 2 TABLETS BY MOUTH AT  BEDTIME FOR DIETING &amp; WEIGHT  LOSS (Patient not taking: Reported on 10/03/2022) 200 tablet 2   No current facility-administered medications on file prior to visit.    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) 04/19/2021   History of revision of total replacement of right hip joint 04/19/2021   OAB (overactive bladder) 04/08/2021   Diet-controlled diabetes mellitus (HCC) 06/19/2018   Insomnia 10/26/2017   Diabetic peripheral neuropathy (HCC) 01/18/2015   CKD stage 2 due to type 2 diabetes mellitus (HCC) 01/18/2015   Morbid obesity (HCC) BMI 35 associated with htn, hyperlipidemia, arthritis, T2DM 07/03/2014   Vitamin D deficiency 03/31/2013   Medication management 03/31/2013   DJD    Glaucoma    Hypothyroidism    Hypertension    Hyperlipidemia associated with type 2 diabetes mellitus (HCC)    GERD (gastroesophageal reflux disease)     Screening Tests Immunization History  Administered Date(s) Administered   Fluad Trivalent(High Dose 65+) 12/21/2022   Influenza, High Dose Seasonal PF 11/25/2016, 11/24/2017, 10/17/2018, 11/14/2018, 11/28/2020   Influenza-Unspecified 12/09/2013, 11/14/2015, 11/23/2016   PFIZER Comirnaty(Gray Top)Covid-19 Tri-Sucrose Vaccine 01/27/2021   PFIZER(Purple Top)SARS-COV-2 Vaccination 05/12/2019, 06/01/2019, 12/16/2019, 06/08/2020   Pneumococcal Conjugate-13 04/21/2015   Pneumococcal Polysaccharide-23 05/29/2008   Td 04/26/2007   Unspecified SARS-COV-2 Vaccination 12/21/2022   Health Maintenance  Topic Date Due   Zoster Vaccines- Shingrix (1 of 2) Never done   DTaP/Tdap/Td (2 - Tdap) 04/25/2017   MAMMOGRAM  09/17/2022   OPHTHALMOLOGY EXAM  02/10/2023   COVID-19 Vaccine (7 - 2023-24 season) 02/15/2023   HEMOGLOBIN A1C  04/05/2023   Medicare Annual Wellness (AWV)  06/27/2023   FOOT EXAM  10/02/2023   Diabetic kidney evaluation - eGFR measurement  10/03/2023   Diabetic kidney evaluation - Urine ACR  10/03/2023   Pneumonia Vaccine 17+ Years old  Completed   INFLUENZA VACCINE  Completed   DEXA SCAN  Completed   HPV VACCINES  Aged Out    Patient Care Team: [name], MD as PCP -  General (Internal Medicine) Gelene Mink, OD as Referring Physician (Optometry) Sheran Luz, MD as Consulting Physician (Physical Medicine and Rehabilitation) Meryl Dare, MD as Consulting Physician (Gastroenterology) Esaw Dace, MD as Attending Physician (Urology)   Allergies Allergies  Allergen Reactions   Meloxicam Swelling    Weight gain    SURGICAL HISTORY She  has a past surgical history that includes Abdominal hysterectomy (1988); Appendectomy; Joint replacement (2007);  Bladder suspension; Knee arthroscopy (03/28/2011); Colonoscopy; right knee srthroplasty; and Total hip revision (Right, 04/19/2021). FAMILY HISTORY Her family history includes Colon polyps in her sister; Diabetes in her sister; Esophageal cancer in some other family members. SOCIAL HISTORY She  reports that she quit smoking about 26 years ago. Her smoking use included cigarettes. She has never used smokeless tobacco. She reports current alcohol use of about 2.0 standard drinks of alcohol per week. She reports that she does not use drugs.   Objective:   Blood pressure 120/68, pulse 71, temperature 97.8 F (36.6 C), height 5' 7.5" (1.715 m), weight 206 lb (93.4 kg), SpO2 99%. Body mass index is 31.79 kg/m.  General appearance: alert, no distress, WD/WN,  female HEENT: normocephalic, sclerae anicteric, TMs pearly, nares patent, no discharge or erythema, pharynx normal Oral cavity: MMM, no lesions Neck: supple, no lymphadenopathy, no thyromegaly, no masses Heart: RRR, normal S1, S2, no murmurs Lungs: CTA bilaterally, no wheezes, rhonchi, or rales Abdomen: +bs, soft, non tender, non distended, no masses, no hepatomegaly, no splenomegaly Musculoskeletal: nontender, antalgic gait with reduced R hip ROM Extremities: no edema, no cyanosis, no clubbing Pulses: 2+ symmetric, upper and lower extremities, normal cap refill Neurological: alert, oriented x 3, CN2-12 intact, strength normal upper  extremities and lower extremities, sensation diminished bil feet to monofilament, DTRs 2+ throughout, no cerebellar signs, gait antalgic but steady  Psychiatric: normal affect, behavior normal, pleasant  Breast: defer Gyn: defer Rectal: defer  Adela Glimpse, NP   04/06/2020

## 2023-01-06 ENCOUNTER — Other Ambulatory Visit: Payer: Self-pay | Admitting: Internal Medicine

## 2023-01-06 DIAGNOSIS — K219 Gastro-esophageal reflux disease without esophagitis: Secondary | ICD-10-CM

## 2023-01-06 MED ORDER — OMEPRAZOLE 20 MG PO CPDR: DELAYED_RELEASE_CAPSULE | ORAL | 3 refills | Status: DC

## 2023-01-08 LAB — LIPID PANEL
Cholesterol: 205 mg/dL — ABNORMAL HIGH (ref <200)
HDL: 96 mg/dL (ref >=50)
LDL Cholesterol (Calc): 93 mg/dL
Non-HDL Cholesterol (Calc): 109 mg/dL (ref <130)
Total CHOL/HDL Ratio: 2.1 (calc) (ref <5.0)
Triglycerides: 72 mg/dL (ref <150)

## 2023-01-08 LAB — HEMOGLOBIN A1C
Hgb A1c MFr Bld: 5.9 %{Hb} — ABNORMAL HIGH (ref <5.7)
Mean Plasma Glucose: 123 mg/dL
eAG (mmol/L): 6.8 mmol/L

## 2023-01-08 LAB — CBC WITH DIFFERENTIAL/PLATELET
Absolute Lymphocytes: 2074 {cells}/uL (ref 850–3900)
Absolute Monocytes: 485 {cells}/uL (ref 200–950)
Basophils Absolute: 38 {cells}/uL (ref 0–200)
Basophils Relative: 0.8 %
Eosinophils Absolute: 120 {cells}/uL (ref 15–500)
Eosinophils Relative: 2.5 %
HCT: 42.4 % (ref 35.0–45.0)
Hemoglobin: 14 g/dL (ref 11.7–15.5)
MCH: 28.1 pg (ref 27.0–33.0)
MCHC: 33 g/dL (ref 32.0–36.0)
MCV: 85 fL (ref 80.0–100.0)
MPV: 9.8 fL (ref 7.5–12.5)
Monocytes Relative: 10.1 %
Neutro Abs: 2083 {cells}/uL (ref 1500–7800)
Neutrophils Relative %: 43.4 %
Platelets: 238 10*3/uL (ref 140–400)
RBC: 4.99 10*6/uL (ref 3.80–5.10)
RDW: 13.5 % (ref 11.0–15.0)
Total Lymphocyte: 43.2 %
WBC: 4.8 10*3/uL (ref 3.8–10.8)

## 2023-01-08 LAB — COMPLETE METABOLIC PANEL WITH GFR
AG Ratio: 1.6 (calc) (ref 1.0–2.5)
ALT: 10 U/L (ref 6–29)
AST: 14 U/L (ref 10–35)
Albumin: 4.3 g/dL (ref 3.6–5.1)
Alkaline phosphatase (APISO): 53 U/L (ref 37–153)
BUN: 19 mg/dL (ref 7–25)
CO2: 28 mmol/L (ref 20–32)
Calcium: 10.3 mg/dL (ref 8.6–10.4)
Chloride: 105 mmol/L (ref 98–110)
Creat: 0.85 mg/dL (ref 0.60–0.95)
Globulin: 2.7 g/dL (ref 1.9–3.7)
Glucose, Bld: 86 mg/dL (ref 65–99)
Potassium: 3.7 mmol/L (ref 3.5–5.3)
Sodium: 142 mmol/L (ref 135–146)
Total Bilirubin: 0.4 mg/dL (ref 0.2–1.2)
Total Protein: 7 g/dL (ref 6.1–8.1)
eGFR: 68 mL/min/{1.73_m2} (ref >=60)

## 2023-01-08 LAB — ESTROGENS, TOTAL: Estrogen: 270 pg/mL

## 2023-01-08 LAB — TSH: TSH: 1.6 m[IU]/L (ref 0.40–4.50)

## 2023-01-10 ENCOUNTER — Other Ambulatory Visit: Payer: Self-pay | Admitting: Nurse Practitioner

## 2023-01-10 ENCOUNTER — Telehealth: Payer: Self-pay | Admitting: Nurse Practitioner

## 2023-01-10 DIAGNOSIS — E1142 Type 2 diabetes mellitus with diabetic polyneuropathy: Secondary | ICD-10-CM

## 2023-01-10 MED ORDER — GABAPENTIN 600 MG PO TABS: ORAL_TABLET | ORAL | 3 refills | Status: DC

## 2023-01-10 NOTE — Telephone Encounter (Signed)
Refill request on Gabapentin. Pls send to [facility] - [place], [place] - [facility] AT Portal to Registered [facility] Sites

## 2023-01-10 NOTE — Addendum Note (Signed)
Addended by: Adela Glimpse on: 01/10/2023 01:53 PM   Modules accepted: Orders

## 2023-01-16 ENCOUNTER — Ambulatory Visit
Admission: RE | Admit: 2023-01-16 | Discharge: 2023-01-16 | Disposition: A | Discharge status: Home / Self Care | Payer: Medicare HMO | Source: Ambulatory Visit | Attending: Internal Medicine | Admitting: Internal Medicine

## 2023-01-16 ENCOUNTER — Ambulatory Visit
Admission: RE | Admit: 2023-01-16 | Discharge: 2023-01-16 | Disposition: A | Discharge status: Home / Self Care | Payer: Medicare HMO | Source: Ambulatory Visit | Attending: Nurse Practitioner | Admitting: Nurse Practitioner

## 2023-01-16 DIAGNOSIS — N958 Other specified menopausal and perimenopausal disorders: Secondary | ICD-10-CM | POA: Diagnosis not present

## 2023-01-16 DIAGNOSIS — Z1231 Encounter for screening mammogram for malignant neoplasm of breast: Secondary | ICD-10-CM | POA: Diagnosis not present

## 2023-01-16 DIAGNOSIS — E2839 Other primary ovarian failure: Secondary | ICD-10-CM | POA: Diagnosis not present

## 2023-01-16 DIAGNOSIS — Z13820 Encounter for screening for osteoporosis: Secondary | ICD-10-CM

## 2023-01-16 DIAGNOSIS — Z90722 Acquired absence of ovaries, bilateral: Secondary | ICD-10-CM | POA: Diagnosis not present

## 2023-01-16 DIAGNOSIS — Z0000 Encounter for general adult medical examination without abnormal findings: Secondary | ICD-10-CM

## 2023-01-18 ENCOUNTER — Other Ambulatory Visit: Payer: Self-pay | Admitting: Internal Medicine

## 2023-01-18 ENCOUNTER — Telehealth: Payer: Self-pay | Admitting: Nurse Practitioner

## 2023-01-18 DIAGNOSIS — E1142 Type 2 diabetes mellitus with diabetic polyneuropathy: Secondary | ICD-10-CM

## 2023-01-18 MED ORDER — GABAPENTIN 600 MG PO TABS: ORAL_TABLET | ORAL | 3 refills | Status: DC

## 2023-01-18 NOTE — Addendum Note (Signed)
Addended by: Adela Glimpse on: 01/18/2023 04:59 PM   Modules accepted: Orders

## 2023-01-18 NOTE — Telephone Encounter (Signed)
Patient would like Gabapentin switched to [facility] mail order due to it being cheaper

## 2023-02-24 ENCOUNTER — Other Ambulatory Visit: Payer: Self-pay

## 2023-02-24 DIAGNOSIS — I10 Essential (primary) hypertension: Secondary | ICD-10-CM

## 2023-02-24 MED ORDER — FUROSEMIDE 40 MG PO TABS: ORAL_TABLET | ORAL | 3 refills | Status: DC

## 2023-03-23 ENCOUNTER — Ambulatory Visit: Payer: Medicare HMO | Admitting: Nurse Practitioner

## 2023-04-11 ENCOUNTER — Ambulatory Visit: Payer: Medicare HMO | Admitting: Nurse Practitioner

## 2023-04-20 ENCOUNTER — Other Ambulatory Visit: Payer: Self-pay

## 2023-04-20 MED ORDER — METFORMIN HCL ER 500 MG PO TB24: ORAL_TABLET | ORAL | 0 refills | Status: DC

## 2023-04-24 ENCOUNTER — Other Ambulatory Visit: Payer: Self-pay

## 2023-04-24 DIAGNOSIS — N951 Menopausal and female climacteric states: Secondary | ICD-10-CM

## 2023-04-24 MED ORDER — ESTRADIOL 0.5 MG PO TABS: ORAL_TABLET | ORAL | 0 refills | Status: DC

## 2023-05-04 ENCOUNTER — Encounter: Payer: Self-pay | Admitting: *Deleted

## 2023-05-18 ENCOUNTER — Encounter: Payer: Self-pay | Admitting: Orthopedic Surgery

## 2023-05-18 ENCOUNTER — Ambulatory Visit: Payer: Medicare Other | Admitting: Orthopedic Surgery

## 2023-05-18 VITALS — BP 114/60 | HR 63 | Temp 97.2°F | Resp 16 | Ht 67.5 in | Wt 202.2 lb

## 2023-05-18 DIAGNOSIS — K219 Gastro-esophageal reflux disease without esophagitis: Secondary | ICD-10-CM | POA: Diagnosis not present

## 2023-05-18 DIAGNOSIS — E6609 Other obesity due to excess calories: Secondary | ICD-10-CM | POA: Diagnosis not present

## 2023-05-18 DIAGNOSIS — N182 Chronic kidney disease, stage 2 (mild): Secondary | ICD-10-CM

## 2023-05-18 DIAGNOSIS — E1122 Type 2 diabetes mellitus with diabetic chronic kidney disease: Secondary | ICD-10-CM

## 2023-05-18 DIAGNOSIS — I10 Essential (primary) hypertension: Secondary | ICD-10-CM

## 2023-05-18 DIAGNOSIS — Z6830 Body mass index (BMI) 30.0-30.9, adult: Secondary | ICD-10-CM

## 2023-05-18 DIAGNOSIS — E785 Hyperlipidemia, unspecified: Secondary | ICD-10-CM

## 2023-05-18 DIAGNOSIS — E66811 Obesity, class 1: Secondary | ICD-10-CM | POA: Diagnosis not present

## 2023-05-18 DIAGNOSIS — E1142 Type 2 diabetes mellitus with diabetic polyneuropathy: Secondary | ICD-10-CM

## 2023-05-18 DIAGNOSIS — N3281 Overactive bladder: Secondary | ICD-10-CM

## 2023-05-18 DIAGNOSIS — E1169 Type 2 diabetes mellitus with other specified complication: Secondary | ICD-10-CM

## 2023-05-18 DIAGNOSIS — E039 Hypothyroidism, unspecified: Secondary | ICD-10-CM

## 2023-05-18 DIAGNOSIS — M25473 Effusion, unspecified ankle: Secondary | ICD-10-CM

## 2023-05-18 NOTE — Patient Instructions (Addendum)
 Schedule fasting lab visit before next appointment> no food for 12 hours before lab test> ok to have black coffee or water only!!!  If you want to wean off gabapentin> take 1/2 tablet twice daily until supple runs low> when you have about 1-2 weeks of supply left> cut down to 1/2 tablet every night, then stop medication after 7 days

## 2023-05-18 NOTE — Progress Notes (Unsigned)
 Careteam: Patient Care Team: [name], MD as PCP - General (Internal Medicine) Gelene Mink, OD as Referring Physician (Optometry) Sheran Luz, MD as Consulting Physician (Physical Medicine and Rehabilitation) Meryl Dare, MD (Inactive) as Consulting Physician (Gastroenterology) Esaw Dace, MD as Attending Physician (Urology)  Seen by: Hazle Nordmann, AGNP-C  PLACE OF SERVICE:  [facility]  Advanced Directive information Does Patient Have a Medical Advance Directive?: Yes, Type of Advance Directive: Healthcare Power of Attorney, Does patient want to make changes to medical advance directive?: No - Patient declined  Allergies  Allergen Reactions   Meloxicam Swelling    Weight gain    Chief Complaint  Patient presents with   Establish Care    New patient.      HPI: Patient is a 83 y.o. female seen today to establish at [facility]   Discussed the use of AI scribe software for clinical note transcription with the patient, who gave verbal consent to proceed.  History of Present Illness    Rebecca Graves is an 83 year old female with hypertension, type 2 diabetes, and neuropathy who presents for a geriatric consultation.  She has type 2 diabetes with associated kidney disease and neuropathy. She manages her diabetes with metformin and monitors her blood sugar at home, averaging in the 90s. She experiences neuropathy, particularly burning in her feet, and takes gabapentin twice a day. She would like to wean off gabapentin due to cost.  Amitriptyline was prescribed by previous provider but she was not interested in trying at this time.   She has a history of hypertension and takes bisoprolol-hydrochlorothiazide for management. She also takes furosemide once a day for ankle swelling, which has improved with this medication. No history of heart attack of CHF.   No history of cancer.   She has elevated cholesterol, total 207, LDL 92 09/2022.  Atorvastatin discontinued due to myalgias and edema 12/2022.   She has low thyroid function, which is managed with medication. No changes have been discussed.  She experiences symptoms of overactive bladder but is not managed with Myrbetriq due to personal preference.  She experiences intermittent acid reflux symptoms. Remains on Omeprazole.   She has been diagnosed with glaucoma and is under regular ophthalmologic care with Dr. Sharlot Gowda.  She is up to date on mammogram and bone density tests, both of which were normal in November. She recently received a shingles vaccine and is unsure if a second dose is required. Pneumonia vaccines are current.  Socially, she is single, lives with her daughter, and has a history of smoking from age 63 to 73, averaging half a pack a day. She consumes alcohol, primarily rum, and denies any history of drug addiction. 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 Moved to [place] in 1995. Single. Daughter and niece main support systems. Retired. Previous work as Dentist. Advanced directives packet given today.    Review of Systems:  Review of Systems  Constitutional: Negative.   HENT: Negative.    Eyes: Negative.   Respiratory:  Negative for cough and shortness of breath.   Cardiovascular:  Positive for leg swelling. Negative for chest pain.  Gastrointestinal:  Positive for heartburn. Negative for abdominal pain and blood in stool.  Genitourinary:  Positive for frequency.  Musculoskeletal:  Positive for joint pain.  Skin: Negative.   Neurological:  Positive for sensory change.  Psychiatric/Behavioral:  Negative for depression and memory loss. The patient has insomnia. The patient  is not nervous/anxious.     Past Medical History:  Diagnosis Date   Anal fissure    Arthritis    Carotid stenosis, <50%, bilateral  12/20/2018   Colon polyps    hyperplastic   Diabetes mellitus without complication (HCC)    diet controlled   Diverticulosis    GERD  (gastroesophageal reflux disease)    Glaucoma    Heart murmur    no problems   Hemorrhoids    Hyperlipidemia    Hypertension    Hypothyroidism    Neuromuscular disorder (HCC)    neuropathy   Tear of lateral meniscus of knee, current 03/27/2011   Past Surgical History:  Procedure Laterality Date   ABDOMINAL HYSTERECTOMY  1988   In [place]   APPENDECTOMY     BLADDER SUSPENSION     COLONOSCOPY     JOINT REPLACEMENT  2007   rt hip replacement   KNEE ARTHROSCOPY  03/28/2011   Procedure: ARTHROSCOPY KNEE;  Surgeon: Nestor Lewandowsky, MD;  Location: [facility];  Service: Orthopedics;  Laterality: Right;  with partial lateral meniscectomy, Debridement of Chondromalacia   right knee srthroplasty     TOTAL HIP REVISION Right 04/19/2021   Procedure: RIGHT TOTAL HIP REVISION;  Surgeon: Gean Birchwood, MD;  Location: [facility];  Service: Orthopedics;  Laterality: Right;   Social History:   reports that she quit smoking about 27 years ago. Her smoking use included cigarettes. She has never used smokeless tobacco. She reports current alcohol use of about 2.0 standard drinks of alcohol per week. She reports that she does not use drugs.  Family History  Problem Relation Age of Onset   Esophageal cancer Other        nefew- age 83   Colon polyps Sister    Diabetes Sister    Esophageal cancer Other    Colon cancer Neg Hx    Rectal cancer Neg Hx    Stomach cancer Neg Hx    Breast cancer Neg Hx     Medications: Patient's Medications  New Prescriptions   No medications on file  Previous Medications   ACETAMINOPHEN (TYLENOL) 500 MG TABLET    Take 1,000 mg by mouth every 6 (six) hours as needed for moderate pain.   ALCOHOL SWABS (B-D SINGLE USE SWABS REGULAR) PADS    Use 1 swab prior to checking blood sugar daily-DX-E11.9   AMITRIPTYLINE (ELAVIL) 25 MG TABLET    Take  1 tablet   3 x /day  for Muscle Cramps or Pain   ASCORBIC ACID ([facility] VITAMIN C) 500 MG TABLET    Take 500 mg by mouth daily.    ASPIRIN EC 81 MG TABLET    Take 1 tablet (81 mg total) by mouth 2 (two) times daily.   ASPIRIN EC 81 MG TABLET    Take 81 mg by mouth daily. Swallow whole.   BISOPROLOL-HYDROCHLOROTHIAZIDE (ZIAC) 10-6.25 MG TABLET    Take 1 tablet Daily for  BP   BLOOD GLUCOSE CALIBRATION (TRUE METRIX LEVEL 1) LOW SOLN    Check blood sugar 1 time daily-DX-E11.9.   BLOOD GLUCOSE METER KIT AND SUPPLIES KIT    Test blood sugar once daily.   BLOOD GLUCOSE MONITORING SUPPL (TRUE METRIX METER) W/DEVICE KIT    Check blood sugar 1 time daily-DX-E11.9   CHOLECALCIFEROL (VITAMIN D) 50 MCG (2000 UT) TABLET    Take 4,000 Units by mouth daily.   CVS MAGNESIUM PO    Take 500 mg by  mouth as directed. 2-3 times daily   DOCUSATE SODIUM (COLACE) 100 MG CAPSULE    Take 200 mg by mouth daily.   ESTRADIOL (ESTRACE) 0.5 MG TABLET    Take  1 tablet   Daily  to Prevent Hot Flashes & Night Sweats   FERROUS SULFATE 325 (65 FE) MG TABLET    Take 325 mg by mouth daily.   FUROSEMIDE (LASIX) 40 MG TABLET    TAKE 1 TABLET BY MOUTH  DAILY FOR BLOOD PRESSURE  AND FLUID RETENTION Lynnda Child  SWELLING   GABAPENTIN (NEURONTIN) 600 MG TABLET    TAKE 1/2 TO 1 TABLET BY  MOUTH 2 TO 3 TIMES DAILY AS NEEDED FOR PAIN   GLUCOSE BLOOD TEST STRIP    Check blood sugar once daily   IRON, FERROUS SULFATE, PO       LANCETS MISC    Test blood sugar once daily.   LATANOPROST (XALATAN) 0.005 % OPHTHALMIC SOLUTION    Place 1 drop into both eyes at bedtime.   LEVOTHYROXINE (SYNTHROID) 100 MCG TABLET    Take  1 tablet  Daily  on an empty stomach with only water for 30 minutes & no Antacid meds, Calcium or Magnesium for 4 hours & avoid Biotin   MAGNESIUM OXIDE (MAG-OX) 400 MG TABLET    Take 400 mg by mouth 2 (two) times daily.   METFORMIN (GLUCOPHAGE-XR) 500 MG 24 HR TABLET    TAKE 2 TABLETS TWICE A DAY WITH MEALS FOR DIABETES    MELLITUS.   MIRABEGRON ER (MYRBETRIQ) 50 MG TB24 TABLET    Take 1 tablet (50 mg total) by mouth daily.   OMEPRAZOLE (PRILOSEC) 20 MG CAPSULE     TAKE 1 CAPSULE   TWICE DAILY (EVERY 12  HOURS) TO PREVENT HEARTBURN AND INDIGESTION                                                  /                                                                   TAKE                                         BY                                                 MOUTH   TRYPSIN-CASTOR OIL-PERU BALSAM (OPTASE EX)    Place 1 application into both eyes daily.   VITAMIN B-12 (CYANOCOBALAMIN) 1000 MCG TABLET    Take 1,000 mcg by mouth daily.   VITAMIN D PO    Take 5,000 Units by mouth daily.   ZINC 50 MG TABS    Take 25 mg by mouth.  Modified Medications   No medications on file  Discontinued Medications   No medications on file    Physical  Exam:  Vitals:   05/18/23 1415  BP: 114/60  Pulse: 63  Resp: 16  Temp: (!) 97.2 F (36.2 C)  SpO2: 97%  Weight: 202 lb 3.2 oz (91.7 kg)  Height: 5' 7.5" (1.715 m)   Body mass index is 31.2 kg/m. Wt Readings from Last 3 Encounters:  05/18/23 202 lb 3.2 oz (91.7 kg)  01/03/23 206 lb (93.4 kg)  10/03/22 203 lb 9.6 oz (92.4 kg)    Physical Exam Vitals reviewed.  Constitutional:      General: She is not in acute distress. HENT:     Head: Normocephalic.  Eyes:     General:        Right eye: No discharge.        Left eye: No discharge.  Cardiovascular:     Rate and Rhythm: Normal rate and regular rhythm.     Pulses: Normal pulses.     Heart sounds: Normal heart sounds.  Pulmonary:     Effort: Pulmonary effort is normal.     Breath sounds: Normal breath sounds.  Abdominal:     Palpations: Abdomen is soft.  Musculoskeletal:     Cervical back: Neck supple.     Right lower leg: Edema present.     Left lower leg: Edema present.     Comments: Non pitting  Skin:    General: Skin is warm.     Capillary Refill: Capillary refill takes less than 2 seconds.  Neurological:     General: No focal deficit present.     Mental Status: She is alert and oriented to person, place, and time.     Gait: Gait normal.   Psychiatric:        Mood and Affect: Mood normal.     Labs reviewed: Basic Metabolic Panel: Recent Labs    06/27/22 1055 08/11/22 1112 10/03/22 1057 01/03/23 1236  NA 143 140 138 142  K 4.4 4.2 4.4 3.7  CL 104 101 99 105  CO2 32 28 33* 28  GLUCOSE 102* 94 93 86  BUN 17 21 17 19   CREATININE 0.86 0.92 0.92 0.85  CALCIUM 10.3 10.6* 10.5* 10.3  MG  --  1.9 2.1  --   TSH 7.71*  --  4.36 1.60   Liver Function Tests: Recent Labs    08/11/22 1112 10/03/22 1057 01/03/23 1236  AST 15 14 14   ALT 12 11 10   BILITOT 0.6 0.5 0.4  PROT 7.2 7.3 7.0   No results for input(s): "LIPASE", "AMYLASE" in the last 8760 hours. No results for input(s): "AMMONIA" in the last 8760 hours. CBC: Recent Labs    06/27/22 1055 10/03/22 1057 01/03/23 1236  WBC 4.1 3.7* 4.8  NEUTROABS 1,804 1,180* 2,083  HGB 13.0 13.8 14.0  HCT 39.2 41.2 42.4  MCV 81.3 81.9 85.0  PLT 235 245 238   Lipid Panel: Recent Labs    06/27/22 1055 10/03/22 1057 01/03/23 1236  CHOL 186 207* 205*  HDL 101 96 96  LDLCALC 70 92 93  TRIG 66 94 72  CHOLHDL 1.8 2.2 2.1   TSH: Recent Labs    06/27/22 1055 10/03/22 1057 01/03/23 1236  TSH 7.71* 4.36 1.60   A1C: Lab Results  Component Value Date   HGBA1C 5.9 (H) 01/03/2023     Assessment/Plan 1. Essential hypertension (Primary) - controlled with bisoprolol- hydrochlorothiazide and furosemide - CBC with Differential/Platelet; Future - Complete Metabolic Panel with eGFR; Future  2. Hyperlipidemia associated with type 2 diabetes mellitus (HCC) -  Total 207, LDL 92 12/2022 - atorvastatin stopped 12/2022 due to myalgias and worsening edema - cont diet low in fat, processed and fried foods - Lipid Panel; Future  3. Controlled type 2 diabetes mellitus with stage 2 chronic kidney disease, without long-term current use of insulin (HCC) - recent A1c 5.9 - no hypoglycemias - cont metformin, asa, and hydrochlorothiazide - followed by Dr. Sharlot Gowda for yearly eye  exams - Hemoglobin A1c; Future  4. Class 1 obesity due to excess calories with serious comorbidity and body mass index (BMI) of 30.0 to 30.9 in adult - BMI 31.20 - recommend light exercise 150 min/week - limit calories to < 1500/day  5. Gastroesophageal reflux disease without esophagitis - hgb stable - cont omeprazole   6. Acquired hypothyroidism - TSH stable - cont levothyroxine  7. OAB (overactive bladder) - off myrbetriq - discussed avoiding bladder irritants  8. Ankle edema - non pitting - cont furosemide  9. Diabetic peripheral neuropathy (HCC) - ongoing - wants to wean off gabapentin due to cost - not interested in trying amitriptyline - concerned symptoms will not improve - recommend weaning off gabapentin and trying amitriptyline   Labs/tests: cbc/diff, cmp, A1c, lipid panel in future> advised to schedule lab visit prior to next encounter  Total time: 46 minutes. Greater than 50% of total time spent doing patient education regarding T2DM, HTN, HLD, neuropathy, arthritis and ankle edema including symptom/medication management.     Next appt: 06/29/2023  Hazle Nordmann, Juel Burrow  [facility] [phone]

## 2023-05-22 ENCOUNTER — Other Ambulatory Visit

## 2023-05-22 DIAGNOSIS — E1169 Type 2 diabetes mellitus with other specified complication: Secondary | ICD-10-CM | POA: Diagnosis not present

## 2023-05-22 DIAGNOSIS — E1122 Type 2 diabetes mellitus with diabetic chronic kidney disease: Secondary | ICD-10-CM | POA: Diagnosis not present

## 2023-05-22 DIAGNOSIS — I10 Essential (primary) hypertension: Secondary | ICD-10-CM | POA: Diagnosis not present

## 2023-05-22 DIAGNOSIS — N182 Chronic kidney disease, stage 2 (mild): Secondary | ICD-10-CM | POA: Diagnosis not present

## 2023-05-22 DIAGNOSIS — E785 Hyperlipidemia, unspecified: Secondary | ICD-10-CM | POA: Diagnosis not present

## 2023-05-23 LAB — CBC WITH DIFFERENTIAL/PLATELET
Absolute Lymphocytes: 1771 {cells}/uL (ref 850–3900)
Absolute Monocytes: 406 {cells}/uL (ref 200–950)
Basophils Absolute: 28 {cells}/uL (ref 0–200)
Basophils Relative: 0.8 %
Eosinophils Absolute: 98 {cells}/uL (ref 15–500)
Eosinophils Relative: 2.8 %
HCT: 41.2 % (ref 35.0–45.0)
Hemoglobin: 13.9 g/dL (ref 11.7–15.5)
MCH: 28.1 pg (ref 27.0–33.0)
MCHC: 33.7 g/dL (ref 32.0–36.0)
MCV: 83.4 fL (ref 80.0–100.0)
MPV: 9.9 fL (ref 7.5–12.5)
Monocytes Relative: 11.6 %
Neutro Abs: 1197 {cells}/uL — ABNORMAL LOW (ref 1500–7800)
Neutrophils Relative %: 34.2 %
Platelets: 263 10*3/uL (ref 140–400)
RBC: 4.94 10*6/uL (ref 3.80–5.10)
RDW: 13.1 % (ref 11.0–15.0)
Total Lymphocyte: 50.6 %
WBC: 3.5 10*3/uL — ABNORMAL LOW (ref 3.8–10.8)

## 2023-05-23 LAB — COMPLETE METABOLIC PANEL WITH GFR
AG Ratio: 1.7 (calc) (ref 1.0–2.5)
ALT: 10 U/L (ref 6–29)
AST: 12 U/L (ref 10–35)
Albumin: 4.3 g/dL (ref 3.6–5.1)
Alkaline phosphatase (APISO): 58 U/L (ref 37–153)
BUN/Creatinine Ratio: 15 (calc) (ref 6–22)
BUN: 15 mg/dL (ref 7–25)
CO2: 30 mmol/L (ref 20–32)
Calcium: 10.4 mg/dL (ref 8.6–10.4)
Chloride: 99 mmol/L (ref 98–110)
Creat: 1 mg/dL — ABNORMAL HIGH (ref 0.60–0.95)
Globulin: 2.6 g/dL (ref 1.9–3.7)
Glucose, Bld: 112 mg/dL (ref 65–139)
Potassium: 4.2 mmol/L (ref 3.5–5.3)
Sodium: 139 mmol/L (ref 135–146)
Total Bilirubin: 0.6 mg/dL (ref 0.2–1.2)
Total Protein: 6.9 g/dL (ref 6.1–8.1)

## 2023-05-23 LAB — LIPID PANEL
Cholesterol: 216 mg/dL — ABNORMAL HIGH (ref <200)
HDL: 93 mg/dL (ref >=50)
LDL Cholesterol (Calc): 105 mg/dL — ABNORMAL HIGH
Non-HDL Cholesterol (Calc): 123 mg/dL (ref <130)
Total CHOL/HDL Ratio: 2.3 (calc) (ref <5.0)
Triglycerides: 89 mg/dL (ref <150)

## 2023-05-23 LAB — HEMOGLOBIN A1C
Hgb A1c MFr Bld: 5.9 %{Hb} — ABNORMAL HIGH (ref <5.7)
Mean Plasma Glucose: 123 mg/dL
eAG (mmol/L): 6.8 mmol/L

## 2023-06-26 ENCOUNTER — Other Ambulatory Visit: Payer: Self-pay | Admitting: Orthopedic Surgery

## 2023-06-26 DIAGNOSIS — I10 Essential (primary) hypertension: Secondary | ICD-10-CM

## 2023-06-26 DIAGNOSIS — N951 Menopausal and female climacteric states: Secondary | ICD-10-CM

## 2023-06-26 DIAGNOSIS — E039 Hypothyroidism, unspecified: Secondary | ICD-10-CM

## 2023-06-26 NOTE — Telephone Encounter (Unsigned)
 Copied from CRM [id number]. Topic: Clinical - Medication Refill >> Jun 26, 2023 10:32 AM Eleanore Grey wrote: Most Recent Primary Care Visit:  Provider: [facility]  Department: [facility]  Visit Type: LAB VISIT  Date: 05/22/2023  Medication: bisoprolol -hydrochlorothiazide  (ZIAC ) 10-6.25 MG tablet estradiol  (ESTRACE ) 0.5 MG tablet levothyroxine  (SYNTHROID ) 100 MCG tablet  Has the patient contacted their pharmacy? Yes (Agent: If no, request that the patient contact the pharmacy for the refill. If patient does not wish to contact the pharmacy document the reason why and proceed with request.) (Agent: If yes, when and what did the pharmacy advise?)  Is this the correct pharmacy for this prescription? Yes If no, delete pharmacy and type the correct one.  This is the patient's preferred pharmacy:  [facility] - [place], [place] - [address] Portal to Registered Caremark Sites [address] [place] [address] Phone: [phone] Fax: [phone]   Has the prescription been filled recently? No  Is the patient out of the medication? No  Has the patient been seen for an appointment in the last year OR does the patient have an upcoming appointment? Yes  Can we respond through MyChart? No  Agent: Please be advised that Rx refills may take up to 3 business days. We ask that you follow-up with your pharmacy.

## 2023-06-27 MED ORDER — ESTRADIOL 0.5 MG PO TABS: ORAL_TABLET | ORAL | 1 refills | Status: DC

## 2023-06-27 MED ORDER — BISOPROLOL-HYDROCHLOROTHIAZIDE 10-6.25 MG PO TABS: ORAL_TABLET | ORAL | 1 refills | Status: DC

## 2023-06-27 MED ORDER — LEVOTHYROXINE SODIUM 100 MCG PO TABS: ORAL_TABLET | ORAL | 1 refills | Status: DC

## 2023-06-27 NOTE — Telephone Encounter (Signed)
 Patient medication Estradiol  has warning. Medication pend and sent to PCP Arnetha Bhat, NP for approval.

## 2023-06-29 ENCOUNTER — Ambulatory Visit: Admitting: Orthopedic Surgery

## 2023-06-29 ENCOUNTER — Encounter: Payer: Self-pay | Admitting: Orthopedic Surgery

## 2023-06-29 VITALS — BP 116/87 | HR 70 | Temp 97.1°F | Resp 18 | Ht 67.5 in | Wt 203.4 lb

## 2023-06-29 DIAGNOSIS — N182 Chronic kidney disease, stage 2 (mild): Secondary | ICD-10-CM | POA: Diagnosis not present

## 2023-06-29 DIAGNOSIS — I10 Essential (primary) hypertension: Secondary | ICD-10-CM

## 2023-06-29 DIAGNOSIS — E039 Hypothyroidism, unspecified: Secondary | ICD-10-CM | POA: Diagnosis not present

## 2023-06-29 DIAGNOSIS — E1169 Type 2 diabetes mellitus with other specified complication: Secondary | ICD-10-CM | POA: Diagnosis not present

## 2023-06-29 DIAGNOSIS — D72819 Decreased white blood cell count, unspecified: Secondary | ICD-10-CM | POA: Diagnosis not present

## 2023-06-29 DIAGNOSIS — E785 Hyperlipidemia, unspecified: Secondary | ICD-10-CM

## 2023-06-29 DIAGNOSIS — E1122 Type 2 diabetes mellitus with diabetic chronic kidney disease: Secondary | ICD-10-CM

## 2023-06-29 MED ORDER — METFORMIN HCL ER 500 MG PO TB24: 500.0000 mg | ORAL_TABLET | Freq: Two times a day (BID) | ORAL | 2 refills | Status: DC

## 2023-06-29 NOTE — Patient Instructions (Addendum)
 Please have Dr. Wyvonna Heidelberg send note to [facility]  Please get Tdap (tetanus) sometime this year  Schedule medicare annual wellness  Try to follow low fat/ mediterranean diet for cholesterol  Come fasting next lab visit so we can recheck cholesterol> water  and black coffee only

## 2023-06-29 NOTE — Progress Notes (Signed)
 Careteam: Patient Care Team: Arnetha Bhat, NP as PCP - General (Adult Health Nurse Practitioner) Guinevere Lefevre, OD as Referring Physician (Optometry) Adelaide Adjutant, MD as Consulting Physician (Physical Medicine and Rehabilitation) Asencion Blacksmith, MD (Inactive) as Consulting Physician (Gastroenterology) Orvan Blanch, MD as Attending Physician (Urology)  Seen by: Ulyses Gandy, AGNP-C  PLACE OF SERVICE:  [facility]  Advanced Directive information    Allergies  Allergen Reactions   Meloxicam  Swelling    Weight gain   Atorvastatin      Cramping and muscle aches    Chief Complaint  Patient presents with   Medical Management of Chronic Issues     5 weeks follow-up, in addition needs to discuss DTAP, eye exam send request to Guinevere Lefevre, OD, Covid and Medicare Annual Wellness Visit.     HPI: Patient is a 83 y.o. female seen today for medical management of chronic conditions.   Discussed the use of AI scribe software for clinical note transcription with the patient, who gave verbal consent to proceed.  History of Present Illness    Recent lab work discussed.   No health concerns today.   She has experienced fluctuating low white blood cell counts x 2 years. She has a family history of leukemia, as her mother passed away from it.   A1c now indicating prediabetes with an A1c of 5.9. She is on metformin , taking one tablet in the morning and one at night.   Her cholesterol levels are slightly elevated, with a total cholesterol of 216 and an LDL of 105. She is not currently on cholesterol medication due to previous adverse reactions to atorvastatin , which caused cramps and spasms. She discontinued statin in September of last year due to these side effects. She was also prescribed amitriptyline  for this but stopped taking.   Discussed Tdap vaccine.       Review of Systems:  Review of Systems  Constitutional: Negative.   HENT: Negative.    Eyes: Negative.    Respiratory: Negative.    Cardiovascular: Negative.   Gastrointestinal: Negative.   Genitourinary: Negative.   Musculoskeletal: Negative.   Skin: Negative.   Neurological: Negative.   Psychiatric/Behavioral: Negative.      Past Medical History:  Diagnosis Date   Anal fissure    Arthritis    Carotid stenosis, <50%, bilateral  12/20/2018   Colon polyps    hyperplastic   Diabetes mellitus without complication (HCC)    diet controlled   Diverticulosis    GERD (gastroesophageal reflux disease)    Glaucoma    Heart murmur    no problems   Hemorrhoids    Hyperlipidemia    Hypertension    Hypothyroidism    Neuromuscular disorder (HCC)    neuropathy   Tear of lateral meniscus of knee, current 03/27/2011   Past Surgical History:  Procedure Laterality Date   ABDOMINAL HYSTERECTOMY  1988   In [place]   APPENDECTOMY     BLADDER SUSPENSION     COLONOSCOPY     JOINT REPLACEMENT  2007   rt hip replacement   KNEE ARTHROSCOPY  03/28/2011   Procedure: ARTHROSCOPY KNEE;  Surgeon: Ilean Mall, MD;  Location: [facility];  Service: Orthopedics;  Laterality: Right;  with partial lateral meniscectomy, Debridement of Chondromalacia   right knee srthroplasty     TOTAL HIP REVISION Right 04/19/2021   Procedure: RIGHT TOTAL HIP REVISION;  Surgeon: Wendolyn Hamburger, MD;  Location: [facility];  Service: Orthopedics;  Laterality: Right;   Social History:   reports that she quit smoking about 27 years ago. Her smoking use included cigarettes. She has never used smokeless tobacco. She reports current alcohol use of about 2.0 standard drinks of alcohol per week. She reports that she does not use drugs.  Family History  Problem Relation Age of Onset   Esophageal cancer Other        nefew- age 95   Colon polyps Sister    Diabetes Sister    Esophageal cancer Other    Colon cancer Neg Hx    Rectal cancer Neg Hx    Stomach cancer Neg Hx    Breast cancer Neg Hx     Medications: Patient's  Medications  New Prescriptions   METFORMIN  (GLUCOPHAGE -XR) 500 MG 24 HR TABLET    Take 1 tablet (500 mg total) by mouth 2 (two) times daily with a meal.  Previous Medications   ACETAMINOPHEN  (TYLENOL ) 500 MG TABLET    Take 1,000 mg by mouth every 6 (six) hours as needed for moderate pain.   ALCOHOL SWABS  (B-D SINGLE USE SWABS  REGULAR) PADS    Use 1 swab prior to checking blood sugar daily-DX-E11.9   ASCORBIC ACID ([facility] VITAMIN C) 500 MG TABLET    Take 500 mg by mouth daily.   ASPIRIN  EC 81 MG TABLET    Take 81 mg by mouth daily. Swallow whole.   BISOPROLOL -HYDROCHLOROTHIAZIDE  (ZIAC ) 10-6.25 MG TABLET    Take 1 tablet Daily for  BP   BLOOD GLUCOSE CALIBRATION (TRUE METRIX LEVEL 1) LOW SOLN    Check blood sugar 1 time daily-DX-E11.9.   BLOOD GLUCOSE METER KIT AND SUPPLIES KIT    Test blood sugar once daily.   BLOOD GLUCOSE MONITORING SUPPL (TRUE METRIX METER) W/DEVICE KIT    Check blood sugar 1 time daily-DX-E11.9   CVS MAGNESIUM  PO    Take 500 mg by mouth as directed. 2-3 times daily   DOCUSATE SODIUM  (COLACE) 100 MG CAPSULE    Take 200 mg by mouth daily.   ESTRADIOL  (ESTRACE ) 0.5 MG TABLET    Take  1 tablet   Daily  to Prevent Hot Flashes & Night Sweats   FERROUS SULFATE 325 (65 FE) MG TABLET    Take 325 mg by mouth daily.   FUROSEMIDE  (LASIX ) 40 MG TABLET    TAKE 1 TABLET BY MOUTH  DAILY FOR BLOOD PRESSURE  AND FLUID RETENTION Abel Abelson  SWELLING   GABAPENTIN  (NEURONTIN ) 600 MG TABLET    TAKE 1/2 TO 1 TABLET BY  MOUTH 2 TO 3 TIMES DAILY AS NEEDED FOR PAIN   GLUCOSE BLOOD TEST STRIP    Check blood sugar once daily   IRON, FERROUS SULFATE, PO       LANCETS MISC    Test blood sugar once daily.   LATANOPROST  (XALATAN ) 0.005 % OPHTHALMIC SOLUTION    Place 1 drop into both eyes at bedtime.   LEVOTHYROXINE  (SYNTHROID ) 100 MCG TABLET    Take  1 tablet  Daily  on an empty stomach with only water  for 30 minutes & no Antacid meds, Calcium  or Magnesium  for 4 hours & avoid Biotin   OMEPRAZOLE  (PRILOSEC) 20 MG  CAPSULE    TAKE 1 CAPSULE   TWICE DAILY (EVERY 12  HOURS) TO PREVENT HEARTBURN AND INDIGESTION                                                  /  TAKE                                         BY                                                 MOUTH   TRYPSIN-CASTOR OIL-PERU BALSAM (OPTASE EX)    Place 1 application into both eyes daily.   VITAMIN B-12 (CYANOCOBALAMIN ) 1000 MCG TABLET    Take 1,000 mcg by mouth daily.   VITAMIN D  PO    Take 5,000 Units by mouth daily.   ZINC 50 MG TABS    Take 25 mg by mouth.  Modified Medications   No medications on file  Discontinued Medications   AMITRIPTYLINE  (ELAVIL ) 25 MG TABLET    Take  1 tablet   3 x /day  for Muscle Cramps or Pain   METFORMIN  (GLUCOPHAGE -XR) 500 MG 24 HR TABLET    TAKE 2 TABLETS TWICE A DAY WITH MEALS FOR DIABETES    MELLITUS.    Physical Exam:  Vitals:   06/29/23 1049  BP: 116/87  Pulse: 70  Resp: 18  Temp: (!) 97.1 F (36.2 C)  SpO2: 99%  Weight: 203 lb 6.4 oz (92.3 kg)  Height: 5' 7.5" (1.715 m)   Body mass index is 31.39 kg/m. Wt Readings from Last 3 Encounters:  06/29/23 203 lb 6.4 oz (92.3 kg)  05/18/23 202 lb 3.2 oz (91.7 kg)  01/03/23 206 lb (93.4 kg)    Physical Exam Vitals reviewed.  Constitutional:      General: She is not in acute distress. HENT:     Head: Normocephalic.  Eyes:     General:        Right eye: No discharge.        Left eye: No discharge.  Neck:     Thyroid : No thyroid  mass or thyromegaly.  Cardiovascular:     Rate and Rhythm: Normal rate and regular rhythm.     Pulses: Normal pulses.     Heart sounds: Normal heart sounds.  Pulmonary:     Effort: Pulmonary effort is normal.     Breath sounds: Normal breath sounds.  Abdominal:     General: Bowel sounds are normal. There is no distension.     Palpations: Abdomen is soft. There is no mass.     Tenderness: There is no abdominal tenderness.     Hernia: No hernia is  present.  Musculoskeletal:     Cervical back: Neck supple.     Right lower leg: No edema.     Left lower leg: No edema.  Skin:    General: Skin is warm.     Capillary Refill: Capillary refill takes less than 2 seconds.  Neurological:     General: No focal deficit present.     Mental Status: She is alert and oriented to person, place, and time.  Psychiatric:        Mood and Affect: Mood normal.     Labs reviewed: Basic Metabolic Panel: Recent Labs    08/11/22 1112 10/03/22 1057 01/03/23 1236 05/22/23 1120  NA 140 138 142 139  K 4.2 4.4 3.7 4.2  CL 101 99 105 99  CO2 28 33* 28 30  GLUCOSE 94 93 86 112  BUN 21 17 19 15   CREATININE 0.92 0.92 0.85 1.00*  CALCIUM  10.6* 10.5* 10.3 10.4  MG 1.9 2.1  --   --   TSH  --  4.36 1.60  --    Liver Function Tests: Recent Labs    10/03/22 1057 01/03/23 1236 05/22/23 1120  AST 14 14 12   ALT 11 10 10   BILITOT 0.5 0.4 0.6  PROT 7.3 7.0 6.9   No results for input(s): "LIPASE", "AMYLASE" in the last 8760 hours. No results for input(s): "AMMONIA" in the last 8760 hours. CBC: Recent Labs    10/03/22 1057 01/03/23 1236 05/22/23 1120  WBC 3.7* 4.8 3.5*  NEUTROABS 1,180* 2,083 1,197*  HGB 13.8 14.0 13.9  HCT 41.2 42.4 41.2  MCV 81.9 85.0 83.4  PLT 245 238 263   Lipid Panel: Recent Labs    10/03/22 1057 01/03/23 1236 05/22/23 1120  CHOL 207* 205* 216*  HDL 96 96 93  LDLCALC 92 93 105*  TRIG 94 72 89  CHOLHDL 2.2 2.1 2.3   TSH: Recent Labs    10/03/22 1057 01/03/23 1236  TSH 4.36 1.60   A1C: Lab Results  Component Value Date   HGBA1C 5.9 (H) 05/22/2023     Assessment/Plan 1. Controlled type 2 diabetes mellitus with stage 2 chronic kidney disease, without long-term current use of insulin  (HCC) (Primary) - A1c 5.9 05/22/2023 - cont metformin   - eye exam scheduled soon - metFORMIN  (GLUCOPHAGE -XR) 500 MG 24 hr tablet; Take 1 tablet (500 mg total) by mouth 2 (two) times daily with a meal.  Dispense: 180  tablet; Refill: 2  2. Hyperlipidemia associated with type 2 diabetes mellitus (HCC) - total 216, LDL 105 - goal LDL< 70 - unable to tolerate atorvastatin  due to muscle cramping - discussed low fat/ mediterranean diet  3. Leukopenia, unspecified type - WBC 3.5> 4.8 (11/05)> 3.7 (08/05)> 4.1 (04/29) - mother passed from leukemia - recheck cbc/diff next encounter - consider hematology referral  4. Acquired hypothyroidism - TSH 1.60 01/03/2023 - cont levothyroxine   5. Essential hypertension - controlled with bisoprolol -hydrochlorothiazide   Total time: 32 minutes. Greater than 50% of total time spent doing patient education regarding T2DM, HTN, HLD, hypothyroidism, health maintenance and labs including symptom/medication management.    Next appt: Visit date not found  Humza Tallerico Darral Ellis  [facility] [phone]

## 2023-07-04 ENCOUNTER — Ambulatory Visit: Payer: Medicare HMO | Admitting: Internal Medicine

## 2023-07-17 ENCOUNTER — Ambulatory Visit: Payer: Medicare HMO | Admitting: Internal Medicine

## 2023-07-29 ENCOUNTER — Other Ambulatory Visit: Payer: Self-pay | Admitting: Family

## 2023-07-29 DIAGNOSIS — E1122 Type 2 diabetes mellitus with diabetic chronic kidney disease: Secondary | ICD-10-CM

## 2023-08-21 LAB — HM DIABETES EYE EXAM

## 2023-09-16 DIAGNOSIS — R700 Elevated erythrocyte sedimentation rate: Secondary | ICD-10-CM | POA: Diagnosis not present

## 2023-10-03 ENCOUNTER — Ambulatory Visit: Payer: Self-pay | Admitting: *Deleted

## 2023-10-03 ENCOUNTER — Other Ambulatory Visit: Payer: Self-pay | Admitting: Orthopedic Surgery

## 2023-10-03 DIAGNOSIS — E1142 Type 2 diabetes mellitus with diabetic polyneuropathy: Secondary | ICD-10-CM

## 2023-10-03 MED ORDER — GABAPENTIN 600 MG PO TABS: ORAL_TABLET | ORAL | 0 refills | Status: DC

## 2023-10-03 NOTE — Telephone Encounter (Signed)
 She has a long history of diabetic neuropathy. I recommend restarting gabapentin  as needed or at least taking one dose every night at bedtime. I have sent new prescription for gabapentin  to local [facility] pharmacy.

## 2023-10-03 NOTE — Telephone Encounter (Signed)
 Spoke with patient and she is aware of provider's response.

## 2023-10-03 NOTE — Telephone Encounter (Signed)
 FYI Only or Action Required?: Action required by provider: request for appointment.  Patient was last seen in primary care on 06/29/2023 by Rebecca Graves BRAVO, NP.  Called Nurse Triage reporting Pain.  Symptoms began several weeks ago.  Interventions attempted: OTC medications: tylenol  rubbing legs with biofreeze cream at night to sleep .  Symptoms are: gradually worsening.  Triage Disposition: See PCP When Office is Open (Within 3 Days)  Patient/caregiver understands and will follow disposition?: Yes              Copied from CRM #[id number]. Topic: Clinical - Red Word Triage >> Oct 03, 2023 11:01 AM Alfonso ORN wrote: Red Word that prompted transfer to Nurse Triage:   pain shoot up feet and in legs  rate pain 5 , worsen since got of the gabapentin  (NEURONTIN ) 600 MG tablet medication feet burn throughout the day and at night and have numbness at bottom of feet    Pt [phone]      ----------------------------------------------------------------------- From previous Reason for Contact - Scheduling: Patient/patient representative is calling to schedule an appointment. Refer to attachments for appointment information. Reason for Disposition  [1] MODERATE pain (e.g., interferes with normal activities, limping) AND [2] present > 3 days  Answer Assessment - Initial Assessment Questions Appt scheduled 10/12/23 with PCP. Offered earlier appt tomorrow and patient declined at this time. Recommended to call back if needed.     1. ONSET: When did the pain start?      3 weeks ago  2. LOCATION: Where is the pain located?      Bilateral legs feet bottom of feet feels numb 3. PAIN: How bad is the pain?    (Scale 1-10; or mild, moderate, severe)     5/10  4. WORK OR EXERCISE: Has there been any recent work or exercise that involved this part of the body?      No  5. CAUSE: What do you think is causing the leg pain?      neuropathy 6. OTHER SYMPTOMS: Do you have any other  symptoms? (e.g., chest pain, back pain, breathing difficulty, swelling, rash, fever, numbness, weakness)     Feet / legs burning sensation. Since stopping gabapentin  3 weeks ago  7. PREGNANCY: Is there any chance you are pregnant? When was your last menstrual period?     na  Protocols used: Leg Pain-A-AH

## 2023-10-12 ENCOUNTER — Ambulatory Visit: Admitting: Orthopedic Surgery

## 2023-10-16 ENCOUNTER — Ambulatory Visit: Payer: Self-pay

## 2023-10-16 ENCOUNTER — Encounter: Payer: Medicare HMO | Admitting: Internal Medicine

## 2023-10-16 ENCOUNTER — Other Ambulatory Visit: Payer: Self-pay | Admitting: Orthopedic Surgery

## 2023-10-16 DIAGNOSIS — K219 Gastro-esophageal reflux disease without esophagitis: Secondary | ICD-10-CM

## 2023-10-16 DIAGNOSIS — I10 Essential (primary) hypertension: Secondary | ICD-10-CM

## 2023-10-16 MED ORDER — FUROSEMIDE 40 MG PO TABS: 40.0000 mg | ORAL_TABLET | Freq: Every day | ORAL | 1 refills | Status: DC

## 2023-10-16 MED ORDER — OMEPRAZOLE 20 MG PO CPDR: 20.0000 mg | DELAYED_RELEASE_CAPSULE | Freq: Two times a day (BID) | ORAL | 1 refills | Status: AC

## 2023-10-16 NOTE — Telephone Encounter (Signed)
 Patient has request refill on medications that were previously refilled by another provider. Medications pend and sent to PCP Gil Greig BRAVO, NP

## 2023-10-16 NOTE — Telephone Encounter (Signed)
 Patient notified and agreed.

## 2023-10-16 NOTE — Telephone Encounter (Signed)
 If symptoms return, recommend in person visit.

## 2023-10-16 NOTE — Telephone Encounter (Signed)
 Copied from CRM #[id number]. Topic: Clinical - Medication Refill >> Oct 16, 2023 11:03 AM Zane F wrote: Patient is calling in to have her prescription refilled and has been out of the prescription for over a week. Patient stated that she experienced burning sensation in her breast on Friday but the sensation subsided. The patient would [facility]  Medication:   1. furosemide  (LASIX ) 40 MG tablet  2. omeprazole  (PRILOSEC) 20 MG capsule  Has the patient contacted their pharmacy? Yes  This is the patient's preferred pharmacy:  [facility] - [place], [place] - [address] [facility] [address] [place] [address] Phone: [phone] Fax: [phone]   Is this the correct pharmacy for this prescription? Yes    Has the prescription been filled recently? No  Is the patient out of the medication? Yes  Has the patient been seen for an appointment in the last year OR does the patient have an upcoming appointment? Yes  Can we respond through MyChart? No  Agent: Please be advised that Rx refills may take up to 3 business days. We ask that you follow-up with your pharmacy.

## 2023-10-16 NOTE — Telephone Encounter (Signed)
 FYI Only or Action Required?: Action required by provider: update on patient condition.  Patient was last seen in primary care on 06/29/2023 by Gil Greig BRAVO, NP.  Called Nurse Triage reporting Breast Problem.  Symptoms began Thursday-symptoms were gone on Saturday.  Interventions attempted: OTC medications: Tylenol .  Symptoms are: completely resolved.  Triage Disposition: See PCP Within 2 Weeks-patient doesn't feel like she needs anything.   Patient/caregiver understands and will follow disposition?: No, wishes to speak with PCP  Reason for Disposition  [1] Breast pain AND [2] cause is not known  Answer Assessment - Initial Assessment Questions 1. SYMPTOM: What's the main symptom you're concerned about?  (e.g., lump, nipple discharge, pain, rash)     Burning sensation to both breasts 2. LOCATION: Where is the burning sensation located?     Both breasts 3. ONSET: When did started on burning sensation  start?     Started Thursday-went away on Saturday 4. PRIOR HISTORY: Do you have any history of prior problems with your breasts? (e.g., breast cancer, breast implant, fibrocystic breast disease)     no 5. CAUSE: What do you think is causing this symptom?     unsure 6. OTHER SYMPTOMS: Do you have any other symptoms? (e.g., breast pain, fever, nipple discharge, redness or rash)     no  Protocols used: Breast Symptoms-A-AH

## 2023-10-19 ENCOUNTER — Encounter: Payer: Medicare HMO | Admitting: Internal Medicine

## 2023-10-26 ENCOUNTER — Ambulatory Visit (INDEPENDENT_AMBULATORY_CARE_PROVIDER_SITE_OTHER): Admitting: Adult Health

## 2023-10-26 ENCOUNTER — Encounter: Payer: Self-pay | Admitting: Adult Health

## 2023-10-26 ENCOUNTER — Ambulatory Visit: Payer: Self-pay

## 2023-10-26 VITALS — BP 136/78 | HR 65 | Temp 97.3°F | Ht 67.5 in | Wt 201.2 lb

## 2023-10-26 DIAGNOSIS — R600 Localized edema: Secondary | ICD-10-CM

## 2023-10-26 DIAGNOSIS — E1142 Type 2 diabetes mellitus with diabetic polyneuropathy: Secondary | ICD-10-CM

## 2023-10-26 DIAGNOSIS — E039 Hypothyroidism, unspecified: Secondary | ICD-10-CM

## 2023-10-26 DIAGNOSIS — I10 Essential (primary) hypertension: Secondary | ICD-10-CM | POA: Diagnosis not present

## 2023-10-26 MED ORDER — GABAPENTIN 600 MG PO TABS: 600.0000 mg | ORAL_TABLET | Freq: Two times a day (BID) | ORAL | Status: DC

## 2023-10-26 MED ORDER — BISOPROLOL-HYDROCHLOROTHIAZIDE 10-6.25 MG PO TABS
ORAL_TABLET | ORAL | Status: DC
Start: 2023-10-26 — End: 2023-11-24

## 2023-10-26 NOTE — Patient Instructions (Signed)
 Nerve Damage (Peripheral Neuropathy): What to Know Peripheral neuropathy happens when there's damage to the peripheral nerves. These nerves send signals between your spinal cord and your arms and legs. You can have damage in one or more nerves. What are the causes? There are many reasons why nerves can get damaged. Damage may be caused by some diseases, such as: Diabetes. This is the most common cause. Autoimmune diseases, such as rheumatoid arthritis or lupus. These happen when your body's defense system (immune system) attacks healthy parts of your body by mistake. Inherited nerve disease. This is passed down in families. Kidney disease. Thyroid disease. Other causes include: Pressure or stress on a nerve that lasts a long time. Lack of B vitamins from poor diet or drinking too much alcohol. Infections. Some medicines, like chemotherapy used for cancer treatment. Poisonous (toxic) substances, such as lead or mercury. Too little blood flowing to the legs. Sometimes, the cause isn't known. What are the signs or symptoms? Symptoms depend on which nerves are damaged. In your legs, hands, or arms, you may have: Loss of feeling (numbness). Tingling. Burning pain. Very sensitive skin. Weakness. Paralysis. This is when you can't move a part of your body. Clumsiness. Muscle twitching. Loss of balance. You may have trouble walking. Other symptoms can include: Not being able to control when you pee. Feeling dizzy. Problems during sex. How is this diagnosed? Your health care provider will: Ask about your symptoms and medical history. Do a physical exam. Do a neurological exam. They will check your reflexes, how you move, and what you can feel. You may need to have other tests, such as: Blood tests. Nerve tests to check how well your nerves and muscles work. Imaging tests, such as a CT scan or MRI. These are done to rule out other causes. Nerve biopsy. This is when a small piece of a  nerve is removed for testing. Lumbar puncture. A small amount of the fluid that surrounds the brain and spinal cord is taken and checked in a lab. How is this treated? Treatment may include: Treating the cause of the nerve damage. Pain medicines. Other medicines that may help with pain, such as: Medicines that treat seizures. Medicines that treat depression. Pain patches or creams that are put on painful areas of skin. TENS therapy. This uses a device that sends mild electrical pulses to your nerves. This will prevent pain signals from reaching the brain. Massage. Acupuncture. Surgery to relieve pressure on a nerve or to destroy a nerve that's causing pain. This is done only if the other treatments are not helping. You may also need physical therapy to help improve your movement, balance, and muscle strength. You may be told to use a cane or walker if needed. Follow these instructions at home: Medicines Take your medicines only as told. You may need to take steps to help treat or prevent trouble pooping (constipation), such as: Taking medicines to help you poop. Eating foods high in fiber, like beans, whole grains, and fresh fruits and vegetables. Drinking more fluids as told. Ask your provider if it's safe to drive or use machines while taking your medicine. Lifestyle  Do not smoke, vape, or use nicotine or tobacco. Avoid or limit alcohol. Too much alcohol can be harmful to nerves and cause damage. Eat a healthy diet. Safety  Take care to avoid burning or damaging your skin if you have numbness. If you have numbness in your feet: Check your feet each day for signs of injury  or infection. Watch for redness, warmth, and swelling. Wear padded socks and comfortable shoes. These help protect your feet. General instructions If you have diabetes, keep your blood sugar under control. Develop a good support system. Living with nerve damage can cause a lot of stress. Talk with a mental  health therapist or join a support group. Exercise as told. Ask what things are safe for you to do at home. Work with a pain specialist to find the best way to manage your pain. Keep all follow-up visits. Your provider will check if the treatments are working and change them if needed. Where to find support The Foundation for Peripheral Neuropathy: BudgetMan[employer].si Where to find more information To learn more, go to: [employer] of Neurological Disorders and Stroke at BasicFM.no. Click Search and type peripheral neuropathy. Find the link you need. Contact a health care provider if: Your pain treatments are not working. Your symptoms are getting worse. You have side effects from any of your medicines. You have new symptoms. You're having a hard time coping with your condition. Get help right away if: You have a wound that's not healing. You have new weakness in an arm or leg. You've fallen or you fall often. This information is not intended to replace advice given to you by your health care provider. Make sure you discuss any questions you have with your health care provider. Document Revised: 05/11/2023 Document Reviewed: 05/11/2023 Elsevier Patient Education  2025 [employer].

## 2023-10-26 NOTE — Telephone Encounter (Signed)
 FYI Only or Action Required?: FYI only for provider.  Patient was last seen in primary care on 06/29/2023 by Gil Greig BRAVO, NP.  Called Nurse Triage reporting No chief complaint on file..  Symptoms began about a month ago.  Interventions attempted: Prescription medications: Gabapentin .  Symptoms are: gradually worsening.  Triage Disposition: See PCP When Office is Open (Within 3 Days)  Patient/caregiver understands and will follow disposition?: Yes   Copied from CRM #[id number]. Topic: Clinical - Red Word Triage >> Oct 26, 2023 10:15 AM Zane F wrote: [facility] that prompted transfer to Nurse Triage:   Painful restless feet (sharp sticking pain like a needle is sticking you; then they go numb at the bottom of her feet) patient increased her gabapentin  to three times a day versus the nightly daily dose prescribed Reason for Disposition  [1] Tingling in feet AND [2] new or increased  Answer Assessment - Initial Assessment Questions 1. ONSET: When did the pain start?       Last Month  2. LOCATION: Where is the pain located?      Bilaterally and in the ankles  3. PAIN: How bad is the pain?    (Scale 1-10; or mild, moderate, severe)     Moderate  4. WORK OR EXERCISE: Has there been any recent work or exercise that involved this part of the body?      No  5. CAUSE: What do you think is causing the foot pain?     Neuropathy  6. OTHER SYMPTOMS: Do you have any other symptoms? (e.g., leg pain, rash, fever, numbness)     Burning Pain, Ankle Pain  7. PREGNANCY: Is there any chance you are pregnant? When was your last menstrual period?     No and No  Patient also states she has been taking 1 Tablet of Gabapentin  TID, which equals out to 1800 mg daily, as the patient confirmed the dose on the bottle. Provided education to the patient regarding this and what is ordered. Encouraged to discuss this at appointment today.  Protocols used: Foot Pain-A-AH

## 2023-10-26 NOTE — Progress Notes (Signed)
 [facility] clinic  Provider:  Jereld Serum DNP  Code Status:  Full Code  Goals of Care:     05/18/2023    2:38 PM  Advanced Directives  Does Patient Have a Medical Advance Directive? Yes  Type of Advance Directive Healthcare Power of Attorney  Does patient want to make changes to medical advance directive? No - Patient declined  Copy of Healthcare Power of Attorney in Chart? No - copy requested     Chief Complaint  Patient presents with   Foot Pain    Feet and ankles are slightly swelling, feel tingly and numbness on the bottom of feet.     Discussed the use of AI scribe software for clinical note transcription with the patient, who gave verbal consent to proceed.  HPI: Patient is a 83 y.o. female seen today for an acute visit for feet pain. She has history of diabetes who presents with a burning sensation in her feet.  She experiences a burning sensation in her feet, described as 'needles slipping through them,' along with occasional weakness around her ankles. Her A1c is well controlled at 5.9. She has been taking gabapentin  600 mg at bedtime but recently increased the dose to three times a day on her own due to increased symptoms, which provided relief. However, she has since returned to taking it once at night and once in the morning.  She has a history of diabetes, managed with metformin  500 mg twice daily, and her blood sugar is well controlled. She also has hypertension, for which she takes furosemide  40 mg daily and bisoprolol -hydrochlorothiazide  10-6.25 mg, half a tablet daily. She mentions experiencing swelling in her legs, for which she takes furosemide . She uses compression socks and elevates her legs to manage the swelling. She also has a recliner chair to assist with this.  She lives alone and reports minimal physical activity, attributing it to 'laziness.' She experiences occasional weakness in her ankles, which she notices while walking.  She has been taking iron  supplements, but her hemoglobin levels were normal in March 2025.  She reports urinary incontinence, describing it as 'getting worse,' but she manages it on her own and does not wish to see a urologist at this time.  She drinks alcohol occasionally, about once or twice a month, and does not smoke. No wounds on her feet.    Past Medical History:  Diagnosis Date   Anal fissure    Arthritis    Carotid stenosis, <50%, bilateral  12/20/2018   Colon polyps    hyperplastic   Diabetes mellitus without complication (HCC)    diet controlled   Diverticulosis    GERD (gastroesophageal reflux disease)    Glaucoma    Heart murmur    no problems   Hemorrhoids    Hyperlipidemia    Hypertension    Hypothyroidism    Neuromuscular disorder (HCC)    neuropathy   Tear of lateral meniscus of knee, current 03/27/2011    Past Surgical History:  Procedure Laterality Date   ABDOMINAL HYSTERECTOMY  1988   In [place]   APPENDECTOMY     BLADDER SUSPENSION     COLONOSCOPY     JOINT REPLACEMENT  2007   rt hip replacement   KNEE ARTHROSCOPY  03/28/2011   Procedure: ARTHROSCOPY KNEE;  Surgeon: Dempsey JINNY Sensor, MD;  Location: [facility];  Service: Orthopedics;  Laterality: Right;  with partial lateral meniscectomy, Debridement of Chondromalacia   right knee srthroplasty  TOTAL HIP REVISION Right 04/19/2021   Procedure: RIGHT TOTAL HIP REVISION;  Surgeon: Liam Lerner, MD;  Location: [facility];  Service: Orthopedics;  Laterality: Right;    Allergies  Allergen Reactions   Meloxicam  Swelling    Weight gain   Atorvastatin      Cramping and muscle aches    Outpatient Encounter Medications as of 10/26/2023  Medication Sig   acetaminophen  (TYLENOL ) 500 MG tablet Take 1,000 mg by mouth every 6 (six) hours as needed for moderate pain.   Alcohol Swabs  (B-D SINGLE USE SWABS  REGULAR) PADS Use 1 swab prior to checking blood sugar daily-DX-E11.9   ascorbic acid (CVS VITAMIN C) 500 MG tablet Take 500  mg by mouth daily.   aspirin  EC 81 MG tablet Take 81 mg by mouth daily. Swallow whole.   blood glucose meter kit and supplies KIT Test blood sugar once daily.   CVS MAGNESIUM  PO Take 500 mg by mouth as directed. 2-3 times daily   docusate sodium  (COLACE) 100 MG capsule Take 200 mg by mouth daily.   estradiol  (ESTRACE ) 0.5 MG tablet Take  1 tablet   Daily  to Prevent Hot Flashes & Night Sweats   furosemide  (LASIX ) 40 MG tablet Take 1 tablet (40 mg total) by mouth daily. FOR BLOOD PRESSURE  AND FLUID RETENTION PARNELL  SWELLING   glucose blood test strip Check blood sugar once daily   Lancets MISC Test blood sugar once daily.   latanoprost  (XALATAN ) 0.005 % ophthalmic solution Place 1 drop into both eyes at bedtime.   levothyroxine  (SYNTHROID ) 100 MCG tablet Take  1 tablet  Daily  on an empty stomach with only water  for 30 minutes & no Antacid meds, Calcium  or Magnesium  for 4 hours & avoid Biotin   metFORMIN  (GLUCOPHAGE -XR) 500 MG 24 hr tablet Take 1 tablet (500 mg total) by mouth 2 (two) times daily with a meal.   omeprazole  (PRILOSEC) 20 MG capsule Take 1 capsule (20 mg total) by mouth every 12 (twelve) hours. TO PREVENT HEARTBURN AND INDIGESTION   Trypsin-Castor Oil-[place] Balsam (OPTASE EX) Place 1 application  into both eyes 2 (two) times daily.   vitamin B-12 (CYANOCOBALAMIN ) 1000 MCG tablet Take 1,000 mcg by mouth daily.   VITAMIN D  PO Take 5,000 Units by mouth daily.   Zinc 50 MG TABS Take 25 mg by mouth.   [DISCONTINUED] bisoprolol -hydrochlorothiazide  (ZIAC ) 10-6.25 MG tablet Take 1 tablet Daily for  BP   [DISCONTINUED] ferrous sulfate 325 (65 FE) MG tablet Take 325 mg by mouth daily.   [DISCONTINUED] gabapentin  (NEURONTIN ) 600 MG tablet Take 600 mg by mouth at bedtime.   bisoprolol -hydrochlorothiazide  (ZIAC ) 10-6.25 MG tablet Take half tablet Daily for  BP   gabapentin  (NEURONTIN ) 600 MG tablet Take 1 tablet (600 mg total) by mouth 2 (two) times daily. Take 1 tab in the morning and 1 tab at  bedtime   [DISCONTINUED] Blood Glucose Calibration (TRUE METRIX LEVEL 1) Low SOLN Check blood sugar 1 time daily-DX-E11.9. (Patient not taking: Reported on 10/26/2023)   [DISCONTINUED] Blood Glucose Monitoring Suppl (TRUE METRIX METER) w/Device KIT Check blood sugar 1 time daily-DX-E11.9 (Patient not taking: Reported on 10/26/2023)   [DISCONTINUED] gabapentin  (NEURONTIN ) 600 MG tablet TAKE 1/2 TO 1 TABLET BY  MOUTH 2 TO 3 TIMES DAILY AS NEEDED FOR PAIN (Patient not taking: Reported on 10/26/2023)   [DISCONTINUED] IRON, FERROUS SULFATE, PO  (Patient not taking: Reported on 10/26/2023)   No facility-administered encounter medications on file as of 10/26/2023.  Review of Systems:  Review of Systems  Constitutional:  Negative for appetite change, chills, fatigue and fever.  HENT:  Negative for congestion, hearing loss, rhinorrhea and sore throat.   Eyes: Negative.   Respiratory:  Negative for cough, shortness of breath and wheezing.   Cardiovascular:  Negative for chest pain, palpitations and leg swelling.  Gastrointestinal:  Negative for abdominal pain, constipation, diarrhea, nausea and vomiting.  Genitourinary:  Negative for dysuria.  Musculoskeletal:  Negative for arthralgias, back pain and myalgias.       Feet pain  Skin:  Negative for color change, rash and wound.  Neurological:  Negative for dizziness, weakness and headaches.  Psychiatric/Behavioral:  Negative for behavioral problems. The patient is not nervous/anxious.     Health Maintenance  Topic Date Due   DTaP/Tdap/Td (2 - Tdap) 04/25/2017   COVID-19 Vaccine (7 - Pfizer risk 2024-25 season) 06/21/2023   Medicare Annual Wellness (AWV)  06/27/2023   Zoster Vaccines- Shingrix (2 of 2) 07/10/2023   Diabetic kidney evaluation - Urine ACR  10/03/2023   INFLUENZA VACCINE  09/29/2023   FOOT EXAM  10/02/2023   HEMOGLOBIN A1C  11/22/2023   MAMMOGRAM  01/16/2024   Diabetic kidney evaluation - eGFR measurement  05/21/2024    OPHTHALMOLOGY EXAM  08/20/2024   Pneumococcal Vaccine: 50+ Years  Completed   DEXA SCAN  Completed   HPV VACCINES  Aged Out   Meningococcal B Vaccine  Aged Out    Physical Exam: Vitals:   10/26/23 1339  BP: 136/78  Pulse: 65  Temp: (!) 97.3 F (36.3 C)  SpO2: 100%  Weight: 201 lb 3.2 oz (91.3 kg)  Height: 5' 7.5 (1.715 m)   Body mass index is 31.05 kg/m. Physical Exam Constitutional:      Appearance: Normal appearance.  HENT:     Head: Normocephalic and atraumatic.     Nose: Nose normal.     Mouth/Throat:     Mouth: Mucous membranes are moist.  Eyes:     Conjunctiva/sclera: Conjunctivae normal.  Cardiovascular:     Rate and Rhythm: Normal rate and regular rhythm.  Pulmonary:     Effort: Pulmonary effort is normal.     Breath sounds: Normal breath sounds.  Abdominal:     General: Bowel sounds are normal.     Palpations: Abdomen is soft.  Musculoskeletal:        General: Normal range of motion.     Cervical back: Normal range of motion.  Skin:    General: Skin is warm and dry.  Neurological:     General: No focal deficit present.     Mental Status: She is alert and oriented to person, place, and time.  Psychiatric:        Mood and Affect: Mood normal.        Behavior: Behavior normal.        Thought Content: Thought content normal.        Judgment: Judgment normal.     Labs reviewed: Basic Metabolic Panel: Recent Labs    01/03/23 1236 05/22/23 1120  NA 142 139  K 3.7 4.2  CL 105 99  CO2 28 30  GLUCOSE 86 112  BUN 19 15  CREATININE 0.85 1.00*  CALCIUM  10.3 10.4  TSH 1.60  --    Liver Function Tests: Recent Labs    01/03/23 1236 05/22/23 1120  AST 14 12  ALT 10 10  BILITOT 0.4 0.6  PROT 7.0 6.9   No results for  input(s): LIPASE, AMYLASE in the last 8760 hours. No results for input(s): AMMONIA in the last 8760 hours. CBC: Recent Labs    01/03/23 1236 05/22/23 1120  WBC 4.8 3.5*  NEUTROABS 2,083 1,197*  HGB 14.0 13.9  HCT 42.4  41.2  MCV 85.0 83.4  PLT 238 263   Lipid Panel: Recent Labs    01/03/23 1236 05/22/23 1120  CHOL 205* 216*  HDL 96 93  LDLCALC 93 105*  TRIG 72 89  CHOLHDL 2.1 2.3   Lab Results  Component Value Date   HGBA1C 5.9 (H) 05/22/2023    Procedures since last visit: No results found.  Assessment/Plan  1. Diabetic peripheral neuropathy (HCC) (Primary) -  with burning sensation and weakness in feet and ankles. Well-controlled diabetes with A1c at 5.9%. - Adjust gabapentin  to 600 mg twice daily, morning and bedtime. - Encourage regular exercise, including walking and range of motion exercises. - Provide education materials on neuropathy. - gabapentin  (NEURONTIN ) 600 MG tablet; Take 1 tablet (600 mg total) by mouth 2 (two) times daily. Take 1 tab in the morning and 1 tab at bedtime  2. Type 2 diabetes mellitus with diabetic polyneuropathy, without long-term current use of insulin  (HCC) Lab Results  Component Value Date   HGBA1C 5.9 (H) 05/22/2023    -  continue Metformin  XR 500 mg BID  3. Essential hypertension -  well-controlled with current medication. Blood pressure at 136/78 mmHg. - bisoprolol -hydrochlorothiazide  (ZIAC ) 10-6.25 MG tablet; Take half tablet Daily for  BP  4. Bilateral lower extremity edema -  managed with furosemide . Uses compression socks and elevates legs as needed. - Continue furosemide  40 mg daily. - Encourage use of compression socks. - Advise leg elevation when sitting for extended periods.  5. Hypothyroidism, unspecified type Lab Results  Component Value Date   TSH 1.60 01/03/2023    -  Managed with levothyroxine  100 mcg daily. - Continue levothyroxine  100 mcg daily.   General Health Maintenance Discussed importance of regular exercise for stability and health. Reports minimal exercise. - Encourage regular physical activity, including walking and gym exercises. - Advise on use of a fitness tracker to monitor activity levels.      Labs/tests ordered:  None   Return if symptoms worsen or fail to improve.  Josaiah Muhammed Medina-Vargas, NP

## 2023-11-02 DIAGNOSIS — Z008 Encounter for other general examination: Secondary | ICD-10-CM | POA: Diagnosis not present

## 2023-11-24 ENCOUNTER — Other Ambulatory Visit: Payer: Self-pay | Admitting: Orthopedic Surgery

## 2023-11-24 DIAGNOSIS — E039 Hypothyroidism, unspecified: Secondary | ICD-10-CM

## 2023-11-24 DIAGNOSIS — I10 Essential (primary) hypertension: Secondary | ICD-10-CM

## 2023-11-24 NOTE — Progress Notes (Signed)
 Rebecca Graves                                          MRN: [id number]   11/24/2023   The [facility] Quality Team Specialist reviewed this patient medical record for the purposes of chart review for care gap closure. The following were reviewed: chart review for care gap closure-kidney health evaluation for diabetes:eGFR  and uACR.    [facility] Quality Team

## 2023-12-12 ENCOUNTER — Other Ambulatory Visit: Payer: Self-pay | Admitting: Orthopedic Surgery

## 2023-12-12 DIAGNOSIS — Z1231 Encounter for screening mammogram for malignant neoplasm of breast: Secondary | ICD-10-CM

## 2023-12-28 ENCOUNTER — Other Ambulatory Visit: Payer: Self-pay | Admitting: Orthopedic Surgery

## 2023-12-28 DIAGNOSIS — N951 Menopausal and female climacteric states: Secondary | ICD-10-CM

## 2023-12-28 NOTE — Telephone Encounter (Signed)
 High risk warning

## 2024-01-02 ENCOUNTER — Other Ambulatory Visit: Payer: Self-pay | Admitting: Orthopedic Surgery

## 2024-01-02 ENCOUNTER — Telehealth: Payer: Self-pay

## 2024-01-02 DIAGNOSIS — E039 Hypothyroidism, unspecified: Secondary | ICD-10-CM

## 2024-01-02 DIAGNOSIS — E1142 Type 2 diabetes mellitus with diabetic polyneuropathy: Secondary | ICD-10-CM

## 2024-01-02 DIAGNOSIS — I10 Essential (primary) hypertension: Secondary | ICD-10-CM

## 2024-01-02 DIAGNOSIS — E1169 Type 2 diabetes mellitus with other specified complication: Secondary | ICD-10-CM

## 2024-01-02 NOTE — Telephone Encounter (Signed)
 Fasting lab orders placed. She may schedule lab visit prior to Thursday appointment OR come fasting foe lab work on day out routine visit.

## 2024-01-02 NOTE — Telephone Encounter (Signed)
 No labs ordered are recommended prior to appointment. Will have PCP confirm

## 2024-01-02 NOTE — Telephone Encounter (Signed)
 Spoke with patient and she will come fasting to visit on Thursday

## 2024-01-02 NOTE — Telephone Encounter (Signed)
 Copied from CRM #[id number]. Topic: General - Other >> Jan 02, 2024 10:19 AM Mercer PEDLAR wrote: Reason for CRM: Patient would like a callback to check if labs are needed before her upcoming appointment on 01/04/24.

## 2024-01-02 NOTE — Progress Notes (Signed)
 Rebecca Graves

## 2024-01-04 ENCOUNTER — Ambulatory Visit (INDEPENDENT_AMBULATORY_CARE_PROVIDER_SITE_OTHER): Payer: Self-pay | Admitting: Orthopedic Surgery

## 2024-01-04 ENCOUNTER — Encounter: Payer: Self-pay | Admitting: Orthopedic Surgery

## 2024-01-04 VITALS — BP 128/88 | HR 60 | Temp 97.6°F | Ht 67.5 in | Wt 204.0 lb

## 2024-01-04 DIAGNOSIS — I10 Essential (primary) hypertension: Secondary | ICD-10-CM | POA: Diagnosis not present

## 2024-01-04 DIAGNOSIS — G4709 Other insomnia: Secondary | ICD-10-CM

## 2024-01-04 DIAGNOSIS — E785 Hyperlipidemia, unspecified: Secondary | ICD-10-CM | POA: Diagnosis not present

## 2024-01-04 DIAGNOSIS — E1169 Type 2 diabetes mellitus with other specified complication: Secondary | ICD-10-CM

## 2024-01-04 DIAGNOSIS — E039 Hypothyroidism, unspecified: Secondary | ICD-10-CM

## 2024-01-04 DIAGNOSIS — E1142 Type 2 diabetes mellitus with diabetic polyneuropathy: Secondary | ICD-10-CM | POA: Diagnosis not present

## 2024-01-04 DIAGNOSIS — Z7984 Long term (current) use of oral hypoglycemic drugs: Secondary | ICD-10-CM

## 2024-01-04 NOTE — Progress Notes (Signed)
 Careteam: Patient Care Team: Gil Greig BRAVO, NP as PCP - General (Adult Health Nurse Practitioner) Lita Lye, OD as Referring Physician (Optometry) Bonner Ade, MD as Consulting Physician (Physical Medicine and Rehabilitation) Aneita Gwendlyn DASEN, MD (Inactive) as Consulting Physician (Gastroenterology) Nicholaus Tanda LITTIE DOUGLAS, MD as Attending Physician (Urology)  Seen by: Greig Gil, AGNP-C  PLACE OF SERVICE:  [facility]  Advanced Directive information    Allergies  Allergen Reactions   Meloxicam  Swelling    Weight gain   Atorvastatin      Cramping and muscle aches    Chief Complaint  Patient presents with   Follow-up    6 month follow up     HPI: Patient is a 83 y.o. female seen today for medical management of chronic conditions.   Discussed the use of AI scribe software for clinical note transcription with the patient, who gave verbal consent to proceed.  History of Present Illness   Rebecca Graves is an 83 year old female who presents for a six-month checkup.  She experiences difficulty sleeping, characterized by staying awake all night and trouble falling asleep. She dozes off briefly during the day, particularly when watching TV. She has not tried any sleep aids or melatonin, and gabapentin  taken at night does not help her sleep.  She describes urinary urgency and lack of control, needing to go to the bathroom immediately when the urge arises. No bedwetting or need for pads at night. Her fluid intake consists mostly of water , with a cup of tea or coffee in the morning.  She has concerns about her kidney function after a home visit indicated an abnormality. She drinks water  regularly and avoids NSAIDs, only taking Tylenol  when necessary.  She experiences restless legs and uses 'bowel freeze' for relief, which helps. Gabapentin  is taken for her back and legs but does not alleviate the restless leg syndrome. She reports occasional numbness in the bottom of her  feet.  Her current medications include furosemide , Ziac  (bisoprolol  and hydrochlorothiazide ), levothyroxine , and gabapentin . She takes levothyroxine  on an empty stomach and waits before taking other medications.     No recent falls or injuries.   Denies mood change.   Blood pressure at goal.   Review of Systems:  Review of Systems  Constitutional: Negative.   HENT: Negative.    Eyes: Negative.   Respiratory: Negative.    Cardiovascular: Negative.   Gastrointestinal: Negative.   Genitourinary: Negative.   Musculoskeletal: Negative.   Skin: Negative.     Past Medical History:  Diagnosis Date   Anal fissure    Arthritis    Carotid stenosis, <50%, bilateral  12/20/2018   Colon polyps    hyperplastic   Diabetes mellitus without complication (HCC)    diet controlled   Diverticulosis    GERD (gastroesophageal reflux disease)    Glaucoma    Heart murmur    no problems   Hemorrhoids    Hyperlipidemia    Hypertension    Hypothyroidism    Neuromuscular disorder (HCC)    neuropathy   Tear of lateral meniscus of knee, current 03/27/2011   Past Surgical History:  Procedure Laterality Date   ABDOMINAL HYSTERECTOMY  1988   In [place]   APPENDECTOMY     BLADDER SUSPENSION     COLONOSCOPY     JOINT REPLACEMENT  2007   rt hip replacement   KNEE ARTHROSCOPY  03/28/2011   Procedure: ARTHROSCOPY KNEE;  Surgeon: Dempsey JINNY Sensor, MD;  Location: [facility]  [address];  Service: Orthopedics;  Laterality: Right;  with partial lateral meniscectomy, Debridement of Chondromalacia   right knee srthroplasty     TOTAL HIP REVISION Right 04/19/2021   Procedure: RIGHT TOTAL HIP REVISION;  Surgeon: Liam Lerner, MD;  Location: [facility];  Service: Orthopedics;  Laterality: Right;   Social History:   reports that she quit smoking about 27 years ago. Her smoking use included cigarettes. She has never used smokeless tobacco. She reports current alcohol use of about 2.0 standard drinks of alcohol per  week. She reports that she does not use drugs.  Family History  Problem Relation Age of Onset   Esophageal cancer Other        nefew- age 27   Colon polyps Sister    Diabetes Sister    Esophageal cancer Other    Colon cancer Neg Hx    Rectal cancer Neg Hx    Stomach cancer Neg Hx    Breast cancer Neg Hx     Medications: Patient's Medications  New Prescriptions   No medications on file  Previous Medications   ACETAMINOPHEN  (TYLENOL ) 500 MG TABLET    Take 1,000 mg by mouth every 6 (six) hours as needed for moderate pain.   ALCOHOL SWABS  (B-D SINGLE USE SWABS  REGULAR) PADS    Use 1 swab prior to checking blood sugar daily-DX-E11.9   ASCORBIC ACID (CVS VITAMIN C) 500 MG TABLET    Take 500 mg by mouth daily.   ASPIRIN  EC 81 MG TABLET    Take 81 mg by mouth daily. Swallow whole.   BISOPROLOL -HYDROCHLOROTHIAZIDE  (ZIAC ) 10-6.25 MG TABLET    TAKE 1 TABLET DAILY FOR    BLOOD PRESSURE   BLOOD GLUCOSE METER KIT AND SUPPLIES KIT    Test blood sugar once daily.   CVS MAGNESIUM  PO    Take 500 mg by mouth as directed. 2-3 times daily   DOCUSATE SODIUM  (COLACE) 100 MG CAPSULE    Take 200 mg by mouth daily.   ESTRADIOL  (ESTRACE ) 0.5 MG TABLET    TAKE 1 TABLET DAILY TO     PREVENT HOT FLASHES AND    NIGHT SWEATS   FUROSEMIDE  (LASIX ) 40 MG TABLET    Take 1 tablet (40 mg total) by mouth daily. FOR BLOOD PRESSURE  AND FLUID RETENTION PARNELL  SWELLING   GABAPENTIN  (NEURONTIN ) 600 MG TABLET    Take 1 tablet (600 mg total) by mouth 2 (two) times daily. Take 1 tab in the morning and 1 tab at bedtime   GLUCOSE BLOOD TEST STRIP    Check blood sugar once daily   LANCETS MISC    Test blood sugar once daily.   LATANOPROST  (XALATAN ) 0.005 % OPHTHALMIC SOLUTION    Place 1 drop into both eyes at bedtime.   LEVOTHYROXINE  (SYNTHROID ) 100 MCG TABLET    TAKE 1 TABLET DAILY ON AN EMPTY STOMACH WITH ONLY WATER  FOR 30 MINUTESAND NO ANTACID MEDICATIONS, CALCIUM  OR MAGNESIUM  FOR 4 HOURS AND AVOID BIOTIN   METFORMIN   (GLUCOPHAGE -XR) 500 MG 24 HR TABLET    Take 1 tablet (500 mg total) by mouth 2 (two) times daily with a meal.   OMEPRAZOLE  (PRILOSEC) 20 MG CAPSULE    Take 1 capsule (20 mg total) by mouth every 12 (twelve) hours. TO PREVENT HEARTBURN AND INDIGESTION   TRYPSIN-CASTOR OIL-PERU BALSAM (OPTASE EX)    Place 1 application  into both eyes 2 (two) times daily.   VITAMIN B-12 (CYANOCOBALAMIN ) 1000 MCG TABLET  Take 1,000 mcg by mouth daily.   VITAMIN D  PO    Take 5,000 Units by mouth daily.   ZINC 50 MG TABS    Take 25 mg by mouth.  Modified Medications   No medications on file  Discontinued Medications   No medications on file    Physical Exam:  Vitals:   01/04/24 1107  BP: 128/88  Pulse: 60  Temp: 97.6 F (36.4 C)  SpO2: 98%  Weight: 204 lb (92.5 kg)  Height: 5' 7.5 (1.715 m)   Body mass index is 31.48 kg/m. Wt Readings from Last 3 Encounters:  01/04/24 204 lb (92.5 kg)  10/26/23 201 lb 3.2 oz (91.3 kg)  06/29/23 203 lb 6.4 oz (92.3 kg)    Physical Exam Vitals reviewed.  Constitutional:      General: She is not in acute distress. HENT:     Head: Normocephalic.  Eyes:     General:        Right eye: No discharge.        Left eye: No discharge.  Cardiovascular:     Rate and Rhythm: Normal rate and regular rhythm.     Pulses: Normal pulses.     Heart sounds: Normal heart sounds.  Pulmonary:     Effort: Pulmonary effort is normal.     Breath sounds: Normal breath sounds.  Abdominal:     General: Bowel sounds are normal. There is no distension.     Palpations: Abdomen is soft.     Tenderness: There is no abdominal tenderness.  Musculoskeletal:     Cervical back: Neck supple.     Right lower leg: No edema.     Left lower leg: No edema.  Skin:    General: Skin is warm.     Capillary Refill: Capillary refill takes less than 2 seconds.  Neurological:     General: No focal deficit present.     Mental Status: She is alert and oriented to person, place, and time.   Psychiatric:        Mood and Affect: Mood normal.     Labs reviewed: Basic Metabolic Panel: Recent Labs    05/22/23 1120  NA 139  K 4.2  CL 99  CO2 30  GLUCOSE 112  BUN 15  CREATININE 1.00*  CALCIUM  10.4   Liver Function Tests: Recent Labs    05/22/23 1120  AST 12  ALT 10  BILITOT 0.6  PROT 6.9   No results for input(s): LIPASE, AMYLASE in the last 8760 hours. No results for input(s): AMMONIA in the last 8760 hours. CBC: Recent Labs    05/22/23 1120  WBC 3.5*  NEUTROABS 1,197*  HGB 13.9  HCT 41.2  MCV 83.4  PLT 263   Lipid Panel: Recent Labs    05/22/23 1120  CHOL 216*  HDL 93  LDLCALC 105*  TRIG 89  CHOLHDL 2.3   TSH: No results for input(s): TSH in the last 8760 hours. A1C: Lab Results  Component Value Date   HGBA1C 5.9 (H) 05/22/2023     Assessment/Plan 1. Type 2 diabetes mellitus with diabetic polyneuropathy, without long-term current use of insulin  (HCC) (Primary) - A1c 5.9 06/2023 - no hypoglycemia - cont metformin   - A1c and urine microalbumin > lab visit 11/10 - Microalbumin/Creatinine Ratio, Urine; Future  2. Essential hypertension - controlled with bisoprolol -hydrochlorothiazide  and furosemide   3. Other insomnia - ongoing - gabapentin  not helping - discussed avoiding electronics before bedtime - go to be at  same time  - limit fluids before bedtime - recommended trying magnesium  glycinate supplement   4. Hyperlipidemia associated with type 2 diabetes mellitus (HCC) - not fasting - LDL was 105 04/2023, goal < 70 - not on medication  5. Acquired hypothyroidism - TSH stable - cont levothyroxine    Total time: 31 minutes. Greater than 50% of total time spent doing patient education regarding health maintenance, HTN, T2DM, hypothyroidism, and insomnia including symptom/medication management.    Next appt: Giada Schoppe FORBES Cluster, NP  [facility]  [facility] [phone]

## 2024-01-04 NOTE — Patient Instructions (Addendum)
 Schedule fasting lab visit within next week> no food 12 hours before blood is collected> water  and black coffee ok only!!!  Magnesium  glycinate supplement> follow instruction on bottle> helps with muscle aches/RLS and insomnia   Try to limit television and phone time before bed  Go to bed at same time  Reduce fluids before bedtime

## 2024-01-08 ENCOUNTER — Other Ambulatory Visit

## 2024-01-08 DIAGNOSIS — E1142 Type 2 diabetes mellitus with diabetic polyneuropathy: Secondary | ICD-10-CM

## 2024-01-08 DIAGNOSIS — E039 Hypothyroidism, unspecified: Secondary | ICD-10-CM | POA: Diagnosis not present

## 2024-01-08 DIAGNOSIS — I10 Essential (primary) hypertension: Secondary | ICD-10-CM

## 2024-01-08 DIAGNOSIS — E785 Hyperlipidemia, unspecified: Secondary | ICD-10-CM | POA: Diagnosis not present

## 2024-01-08 DIAGNOSIS — E1169 Type 2 diabetes mellitus with other specified complication: Secondary | ICD-10-CM

## 2024-01-09 ENCOUNTER — Ambulatory Visit: Payer: Self-pay | Admitting: Orthopedic Surgery

## 2024-01-09 DIAGNOSIS — D72819 Decreased white blood cell count, unspecified: Secondary | ICD-10-CM

## 2024-01-09 LAB — COMPLETE METABOLIC PANEL WITHOUT GFR
AG Ratio: 1.8 (calc) (ref 1.0–2.5)
ALT: 10 U/L (ref 6–29)
AST: 14 U/L (ref 10–35)
Albumin: 4.4 g/dL (ref 3.6–5.1)
Alkaline phosphatase (APISO): 43 U/L (ref 37–153)
BUN: 21 mg/dL (ref 7–25)
CO2: 32 mmol/L (ref 20–32)
Calcium: 10.2 mg/dL (ref 8.6–10.4)
Chloride: 101 mmol/L (ref 98–110)
Creat: 0.88 mg/dL (ref 0.60–0.95)
Globulin: 2.4 g/dL (ref 1.9–3.7)
Glucose, Bld: 117 mg/dL — ABNORMAL HIGH (ref 65–99)
Potassium: 4.2 mmol/L (ref 3.5–5.3)
Sodium: 141 mmol/L (ref 135–146)
Total Bilirubin: 0.8 mg/dL (ref 0.2–1.2)
Total Protein: 6.8 g/dL (ref 6.1–8.1)

## 2024-01-09 LAB — HEMOGLOBIN A1C
Hgb A1c MFr Bld: 6.1 % — ABNORMAL HIGH (ref <5.7)
Mean Plasma Glucose: 128 mg/dL
eAG (mmol/L): 7.1 mmol/L

## 2024-01-09 LAB — MICROALBUMIN / CREATININE URINE RATIO
Creatinine, Urine: 20 mg/dL (ref 20–275)
Microalb, Ur: <0.2 mg/dL

## 2024-01-09 LAB — CBC WITH DIFFERENTIAL/PLATELET
Absolute Lymphocytes: 1726 {cells}/uL (ref 850–3900)
Absolute Monocytes: 356 {cells}/uL (ref 200–950)
Basophils Absolute: 30 {cells}/uL (ref 0–200)
Basophils Relative: 0.9 %
Eosinophils Absolute: 119 {cells}/uL (ref 15–500)
Eosinophils Relative: 3.6 %
HCT: 42.9 % (ref 35.0–45.0)
Hemoglobin: 14.4 g/dL (ref 11.7–15.5)
MCH: 27.7 pg (ref 27.0–33.0)
MCHC: 33.6 g/dL (ref 32.0–36.0)
MCV: 82.7 fL (ref 80.0–100.0)
MPV: 9.9 fL (ref 7.5–12.5)
Monocytes Relative: 10.8 %
Neutro Abs: 1069 {cells}/uL — ABNORMAL LOW (ref 1500–7800)
Neutrophils Relative %: 32.4 %
Platelets: 247 Thousand/uL (ref 140–400)
RBC: 5.19 Million/uL — ABNORMAL HIGH (ref 3.80–5.10)
RDW: 13.3 % (ref 11.0–15.0)
Total Lymphocyte: 52.3 %
WBC: 3.3 Thousand/uL — ABNORMAL LOW (ref 3.8–10.8)

## 2024-01-09 LAB — LIPID PANEL
Cholesterol: 191 mg/dL (ref <200)
HDL: 88 mg/dL (ref >=50)
LDL Cholesterol (Calc): 86 mg/dL
Non-HDL Cholesterol (Calc): 103 mg/dL (ref <130)
Total CHOL/HDL Ratio: 2.2 (calc) (ref <5.0)
Triglycerides: 79 mg/dL (ref <150)

## 2024-01-09 LAB — TSH: TSH: 1.89 m[IU]/L (ref 0.40–4.50)

## 2024-01-17 ENCOUNTER — Ambulatory Visit
Admission: RE | Admit: 2024-01-17 | Discharge: 2024-01-17 | Disposition: A | Discharge status: Home / Self Care | Source: Ambulatory Visit | Attending: Orthopedic Surgery | Admitting: Orthopedic Surgery

## 2024-01-17 DIAGNOSIS — Z1231 Encounter for screening mammogram for malignant neoplasm of breast: Secondary | ICD-10-CM | POA: Diagnosis not present

## 2024-01-28 NOTE — Progress Notes (Unsigned)
 [facility] [phone]   [phone]  INITIAL CONSULT NOTE  Patient Care Team: Gil Greig BRAVO, NP as PCP - General (Adult Health Nurse Practitioner) Lita Lye, OD as Referring Physician (Optometry) Bonner Ade, MD as Consulting Physician (Physical Medicine and Rehabilitation) Aneita Gwendlyn DASEN, MD (Inactive) as Consulting Physician (Gastroenterology) Nicholaus Tanda LITTIE DOUGLAS, MD as Attending Physician (Urology)  Hematological/Oncological History # Leukopenia  05/22/2023: White blood cell 3.5, hemoglobin 13.9, MCV 83.4, platelets 263 01/08/2024: White blood cell 3.3, hemoglobin 14.4, MCV 82.7, platelets 247 01/29/2024: establish care with Dr. Federico   CHIEF COMPLAINTS/PURPOSE OF CONSULTATION:  Leukopenia    HISTORY OF PRESENTING ILLNESS:  Rebecca Graves 83 y.o. female with medical history significant for type 2 diabetes, diverticulosis, GERD, hyperlipidemia, hypertension, hypothyroidism, and neuropathy who presents for evaluation of leukopenia.  On review of the previous records Rebecca Graves has had low to low normal white blood cell counts as far back as our records ago to February 2015.  She has never dropped below 3.0 and is typically in the 3.0-5.0 range.  Typically it is a neutropenia, with ANC typically between 1015 100.  Her most recent labs in March 2025 showed a white blood cell count of 3.5 with ANC of 1197, with a subsequent lab draw on 01/08/2024 showing a white blood cell count of 3.3 with a ANC of 1069.  Due to concern for these findings patient was referred to hematology for further evaluation and management.  On exam today Rebecca Graves reports she has been well overall in interim since her last blood draw.  She reports that she does not feel bad at all.  She notes that she is concerned that her white blood cell count consistently dropped over the last 2 checks.  She has had no recent issues with infections such as pneumonias, sinus infections,  or UTIs.  She denies any fevers, chills, sweats.  She has had no issues with nausea, vomiting, or diarrhea.  She reports that she eats everything.  She does not eat red meat very often due to the cost and prefers pork and chicken.  She does take a multivitamin.  She reports she does have some osteoarthritis and has undergone hip and knee replacement.  She reports that she was taking zinc and iron pills at 1 point in time but with her new PCP it has been recommended that she discontinue this.  On further discussion she reports that her mother passed away of leukemia and her father passed away of a heart attack.  She has 1 daughter who is healthy.  She has a brother and sister who are on dialysis therapy due to type 2 diabetes.  Patient drinks alcohol approximate 2-3 times per week in the form of vodka/rum.  She was a smoker but quit in 1998.  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  Otherwise she denies any fevers, chills, sweats, nausea, Oni or diarrhea.  Full 10 point ROS is otherwise negative.  MEDICAL HISTORY:  Past Medical History:  Diagnosis Date   Anal fissure    Arthritis    Carotid stenosis, <50%, bilateral  12/20/2018   Colon polyps    hyperplastic   Diabetes mellitus without complication (HCC)    diet controlled   Diverticulosis    GERD (gastroesophageal reflux disease)    Glaucoma    Heart murmur    no problems   Hemorrhoids    Hyperlipidemia  Hypertension    Hypothyroidism    Neuromuscular disorder (HCC)    neuropathy   Tear of lateral meniscus of knee, current 03/27/2011    SURGICAL HISTORY: Past Surgical History:  Procedure Laterality Date   ABDOMINAL HYSTERECTOMY  1988   In [place]   APPENDECTOMY     BLADDER SUSPENSION     COLONOSCOPY     JOINT REPLACEMENT  2007   rt hip replacement   KNEE ARTHROSCOPY  03/28/2011   Procedure: ARTHROSCOPY KNEE;  Surgeon: Dempsey JINNY Sensor, MD;  Location: [facility];  Service: Orthopedics;  Laterality: Right;  with partial lateral meniscectomy, Debridement of Chondromalacia   right knee srthroplasty     TOTAL HIP REVISION Right 04/19/2021   Procedure: RIGHT TOTAL HIP REVISION;  Surgeon: Sensor Dempsey, MD;  Location: [facility];  Service: Orthopedics;  Laterality: Right;    SOCIAL HISTORY: Social History   Socioeconomic History   Marital status: Single    Spouse name: Not on file   Number of children: Not on file   Years of education: Not on file   Highest education level: Not on file  Occupational History   Not on file  Tobacco Use   Smoking status: Former    Current packs/day: 0.00    Types: Cigarettes    Quit date: 03/22/1996    Years since quitting: 27.8   Smokeless tobacco: Never  Vaping Use   Vaping status: Never Used  Substance and Sexual Activity   Alcohol use: Yes    Alcohol/week: 2.0 standard drinks of alcohol    Types: 2 drink(s) per week    Comment: occ   Drug use: No   Sexual activity: Not Currently  Other Topics Concern   Not on file  Social History Narrative   Not on file   Social Drivers of Health   Financial Resource Strain: Not on file  Food Insecurity: Food Insecurity Present (01/29/2024)   Hunger Vital Sign    Worried About Running Out of Food in the Last Year: Sometimes true    Ran Out of Food in the Last Year: Sometimes true  Transportation Needs: No Transportation Needs (01/29/2024)   PRAPARE - Administrator, Civil Service (Medical): No    Lack of Transportation (Non-Medical): No  Physical Activity: Not on file  Stress: Not on file  Social Connections: Unknown (05/18/2023)   Social Connection and Isolation Panel    Frequency of Communication with Friends and Family: Three times a week    Frequency of Social Gatherings with Friends and Family: Three times a week    Attends Religious Services: Patient declined    Active Member of Clubs or Organizations: No    Attends Banker  Meetings: Never    Marital Status: Patient declined  Intimate Partner Violence: Not At Risk (01/29/2024)   Humiliation, Afraid, Rape, and Kick questionnaire    Fear of Current or Ex-Partner: No    Emotionally Abused: No    Physically Abused: No    Sexually Abused: No    FAMILY HISTORY: Family History  Problem Relation Age of Onset   Esophageal cancer Other        nefew- age 70   Colon polyps Sister    Diabetes Sister    Esophageal cancer Other    Colon cancer Neg Hx    Rectal cancer Neg Hx    Stomach cancer Neg Hx    Breast cancer Neg Hx     ALLERGIES:  is allergic to meloxicam  and atorvastatin .  MEDICATIONS:  Current Outpatient Medications  Medication Sig Dispense Refill   acetaminophen  (TYLENOL ) 500 MG tablet Take 1,000 mg by mouth every 6 (six) hours as needed for moderate pain.     Alcohol Swabs  (B-D SINGLE USE SWABS  REGULAR) PADS Use 1 swab prior to checking blood sugar daily-[id number] 100 each 3   ascorbic acid (CVS VITAMIN C) 500 MG tablet Take 500 mg by mouth daily.     aspirin  EC 81 MG tablet Take 81 mg by mouth daily. Swallow whole.     bisoprolol -hydrochlorothiazide  (ZIAC ) 10-6.25 MG tablet TAKE 1 TABLET DAILY FOR    BLOOD PRESSURE 90 tablet 1   blood glucose meter kit and supplies KIT Test blood sugar once daily. 1 each 0   CVS MAGNESIUM  PO Take 500 mg by mouth as directed. 2-3 times daily     docusate sodium  (COLACE) 100 MG capsule Take 200 mg by mouth daily.     estradiol  (ESTRACE ) 0.5 MG tablet TAKE 1 TABLET DAILY TO     PREVENT HOT FLASHES AND    NIGHT SWEATS 90 tablet 1   furosemide  (LASIX ) 40 MG tablet Take 1 tablet (40 mg total) by mouth daily. FOR BLOOD PRESSURE  AND FLUID RETENTION PARNELL  SWELLING 90 tablet 1   gabapentin  (NEURONTIN ) 600 MG tablet Take 1 tablet (600 mg total) by mouth 2 (two) times daily. Take 1 tab in the morning and 1 tab at bedtime     glucose blood test strip Check blood sugar once daily 100 each 3   Lancets MISC Test blood sugar once  daily. 200 each 11   latanoprost  (XALATAN ) 0.005 % ophthalmic solution Place 1 drop into both eyes at bedtime.     levothyroxine  (SYNTHROID ) 100 MCG tablet TAKE 1 TABLET DAILY ON AN EMPTY STOMACH WITH ONLY WATER  FOR 30 MINUTESAND NO ANTACID MEDICATIONS, CALCIUM  OR MAGNESIUM  FOR 4 HOURS AND AVOID BIOTIN 90 tablet 1   metFORMIN  (GLUCOPHAGE -XR) 500 MG 24 hr tablet Take 1 tablet (500 mg total) by mouth 2 (two) times daily with a meal. 180 tablet 2   omeprazole  (PRILOSEC) 20 MG capsule Take 1 capsule (20 mg total) by mouth every 12 (twelve) hours. TO PREVENT HEARTBURN AND INDIGESTION 180 capsule 1   Trypsin-Castor Oil-Peru Balsam (OPTASE EX) Place 1 application  into both eyes 2 (two) times daily.     vitamin B-12 (CYANOCOBALAMIN ) 1000 MCG tablet Take 1,000 mcg by mouth daily.     VITAMIN D  PO Take 5,000 Units by mouth daily.     Zinc 50 MG TABS Take 25 mg by mouth.     No current facility-administered medications for this visit.    REVIEW OF SYSTEMS:   Constitutional: ( - ) fevers, ( - )  chills , ( - ) night sweats Eyes: ( - ) blurriness of vision, ( - ) double vision, ( - ) watery eyes Ears, nose, mouth, throat, and face: ( - ) mucositis, ( - ) sore throat Respiratory: ( - ) cough, ( - ) dyspnea, ( - ) wheezes Cardiovascular: ( - ) palpitation, ( - ) chest discomfort, ( - ) lower extremity swelling Gastrointestinal:  ( - ) nausea, ( - ) heartburn, ( - ) change in bowel habits Skin: ( - ) abnormal skin rashes Lymphatics: ( - ) new lymphadenopathy, ( - ) easy bruising Neurological: ( - ) numbness, ( - ) tingling, ( - ) new weaknesses Behavioral/Psych: ( - )  mood change, ( - ) new changes  All other systems were reviewed with the patient and are negative.  PHYSICAL EXAMINATION:  Vitals:   01/29/24 1401  BP: 128/73  Pulse: 73  Resp: 13  Temp: (!) 97.5 F (36.4 C)  SpO2: 100%   Filed Weights   01/29/24 1401  Weight: 209 lb 6.4 oz (95 kg)    GENERAL: well appearing elderly  African-American female in NAD  SKIN: skin color, texture, turgor are normal, no rashes or significant lesions EYES: conjunctiva are pink and non-injected, sclera clear LUNGS: clear to auscultation and percussion with normal breathing effort HEART: regular rate & rhythm and no murmurs and no lower extremity edema Musculoskeletal: no cyanosis of digits and no clubbing  PSYCH: alert & oriented x 3, fluent speech NEURO: no focal motor/sensory deficits  LABORATORY DATA:  I have reviewed the data as listed    Latest Ref Rng & Units 01/29/2024    4:05 PM 01/08/2024   10:22 AM 05/22/2023   11:20 AM  CBC  WBC 4.0 - 10.5 K/uL 4.2  3.3  3.5   Hemoglobin 12.0 - 15.0 g/dL 85.5  85.5  86.0   Hematocrit 36.0 - 46.0 % 42.1  42.9  41.2   Platelets 150 - 400 K/uL 246  247  263        Latest Ref Rng & Units 01/08/2024   10:22 AM 05/22/2023   11:20 AM 01/03/2023   12:36 PM  CMP  Glucose 65 - 99 mg/dL 882  887  86   BUN 7 - 25 mg/dL 21  15  19    Creatinine 0.60 - 0.95 mg/dL 9.11  8.99  9.14   Sodium 135 - 146 mmol/L 141  139  142   Potassium 3.5 - 5.3 mmol/L 4.2  4.2  3.7   Chloride 98 - 110 mmol/L 101  99  105   CO2 20 - 32 mmol/L 32  30  28   Calcium  8.6 - 10.4 mg/dL 89.7  89.5  89.6   Total Protein 6.1 - 8.1 g/dL 6.8  6.9  7.0   Total Bilirubin 0.2 - 1.2 mg/dL 0.8  0.6  0.4   AST 10 - 35 U/L 14  12  14    ALT 6 - 29 U/L 10  10  10       ASSESSMENT & PLAN Rebecca Graves 83 y.o. female with medical history significant for type 2 diabetes, diverticulosis, GERD, hyperlipidemia, hypertension, hypothyroidism, and neuropathy who presents for evaluation of leukopenia.  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  The differential for neutropenia includes nutritional deficiency, inflammatory disorder, medication side effect, infectious etiology, or congenital condition  (such as benign ethnic neutropenia). The patient is not taking any medications known to cause neutropenia. Workup for this condition includes viral serologies (HIV, Hep B and Hep C),  vitamin b12/folate, and inflammatory markers with ESR and CRP.    A common cause for low WBC is benign ethnic neutropenia (BEN). This is a condition whereby individuals of African or Mediterranean descent have lower WBC than the general population. The condition typically has an absolute neutrophil count (ANC) between 1000-1500 with no recurrent infections. Patient with this condition have normal functioning immune systems and have no consequences as a result of their low ANC. BEN is a diagnosis of exclusion, so it would require the full above workup  to make.    # Neutropenia, likely benign ethnic neutropenia  --repeat CBC and CMP  --infectious serology testing with Hep B, Hep C, and HIV  --nutritional evaluation with Vitamin b12, folate  --inflammatory workup with ESR and CRP  --RTC pending the results of the above labs.     Orders Placed This Encounter  Procedures   CBC with Differential (Cancer Center Only)    Standing Status:   Future    Number of Occurrences:   1    Expiration Date:   01/28/2025   CMP (Cancer Center only)    Standing Status:   Future    Number of Occurrences:   1    Expiration Date:   01/28/2025   Lactate dehydrogenase (LDH)    Standing Status:   Future    Number of Occurrences:   1    Expiration Date:   01/28/2025   Vitamin B12    Standing Status:   Future    Number of Occurrences:   1    Expiration Date:   01/28/2025   Methylmalonic acid, serum    Standing Status:   Future    Number of Occurrences:   1    Expiration Date:   01/28/2025   Folate, Serum    Standing Status:   Future    Number of Occurrences:   1    Expiration Date:   01/28/2025   Sedimentation rate    Standing Status:   Future    Number of Occurrences:   1    Expiration Date:   01/28/2025   C-reactive protein     Standing Status:   Future    Number of Occurrences:   1    Expiration Date:   01/28/2025   HIV antibody (with reflex)    Standing Status:   Future    Number of Occurrences:   1    Expiration Date:   01/28/2025   Hepatitis C antibody    Standing Status:   Future    Number of Occurrences:   1    Expiration Date:   01/28/2025   Hepatitis B surface antigen    Standing Status:   Future    Number of Occurrences:   1    Expiration Date:   01/28/2025   Hepatitis B surface antibody    Standing Status:   Future    Number of Occurrences:   1    Expiration Date:   01/28/2025   Hepatitis B core antibody, total    Standing Status:   Future    Number of Occurrences:   1    Expiration Date:   01/28/2025    All questions were answered. The patient knows to call the clinic with any problems, questions or concerns.  A total of more than 60 minutes were spent on this encounter with face-to-face time and non-face-to-face time, including preparing to see the patient, ordering tests and/or medications, counseling the patient and coordination of care as outlined above.   Norleen IVAR Kidney, MD Department of Hematology/Oncology [facility] Phone: [phone] Pager: [phone] Email: [email or web address]  01/29/2024 4:38 PM

## 2024-01-29 ENCOUNTER — Inpatient Hospital Stay

## 2024-01-29 ENCOUNTER — Inpatient Hospital Stay: Attending: Hematology and Oncology | Admitting: Hematology and Oncology

## 2024-01-29 VITALS — BP 128/73 | HR 73 | Temp 97.5°F | Resp 13 | Wt 209.4 lb

## 2024-01-29 DIAGNOSIS — I10 Essential (primary) hypertension: Secondary | ICD-10-CM | POA: Diagnosis not present

## 2024-01-29 DIAGNOSIS — G629 Polyneuropathy, unspecified: Secondary | ICD-10-CM | POA: Diagnosis not present

## 2024-01-29 DIAGNOSIS — E785 Hyperlipidemia, unspecified: Secondary | ICD-10-CM | POA: Insufficient documentation

## 2024-01-29 DIAGNOSIS — D700 Congenital agranulocytosis: Secondary | ICD-10-CM

## 2024-01-29 DIAGNOSIS — D709 Neutropenia, unspecified: Secondary | ICD-10-CM | POA: Diagnosis not present

## 2024-01-29 DIAGNOSIS — E119 Type 2 diabetes mellitus without complications: Secondary | ICD-10-CM | POA: Diagnosis not present

## 2024-01-29 DIAGNOSIS — E039 Hypothyroidism, unspecified: Secondary | ICD-10-CM | POA: Insufficient documentation

## 2024-01-29 LAB — CBC WITH DIFFERENTIAL (CANCER CENTER ONLY)
Abs Immature Granulocytes: 0 K/uL (ref 0.00–0.07)
Basophils Absolute: 0 K/uL (ref 0.0–0.1)
Basophils Relative: 1 %
Eosinophils Absolute: 0.1 K/uL (ref 0.0–0.5)
Eosinophils Relative: 2 %
HCT: 42.1 % (ref 36.0–46.0)
Hemoglobin: 14.4 g/dL (ref 12.0–15.0)
Immature Granulocytes: 0 %
Lymphocytes Relative: 43 %
Lymphs Abs: 1.8 K/uL (ref 0.7–4.0)
MCH: 27.6 pg (ref 26.0–34.0)
MCHC: 34.2 g/dL (ref 30.0–36.0)
MCV: 80.8 fL (ref 80.0–100.0)
Monocytes Absolute: 0.4 K/uL (ref 0.1–1.0)
Monocytes Relative: 10 %
Neutro Abs: 1.9 K/uL (ref 1.7–7.7)
Neutrophils Relative %: 44 %
Platelet Count: 246 K/uL (ref 150–400)
RBC: 5.21 MIL/uL — ABNORMAL HIGH (ref 3.87–5.11)
RDW: 13.5 % (ref 11.5–15.5)
WBC Count: 4.2 K/uL (ref 4.0–10.5)
nRBC: 0 % (ref 0.0–0.2)

## 2024-01-29 LAB — CMP (CANCER CENTER ONLY)
ALT: 14 U/L (ref 0–44)
AST: 19 U/L (ref 15–41)
Albumin: 4.7 g/dL (ref 3.5–5.0)
Alkaline Phosphatase: 57 U/L (ref 38–126)
Anion gap: 12 (ref 5–15)
BUN: 21 mg/dL (ref 8–23)
CO2: 29 mmol/L (ref 22–32)
Calcium: 11 mg/dL — ABNORMAL HIGH (ref 8.9–10.3)
Chloride: 101 mmol/L (ref 98–111)
Creatinine: 1.07 mg/dL — ABNORMAL HIGH (ref 0.44–1.00)
GFR, Estimated: 51 mL/min — ABNORMAL LOW (ref >60)
Glucose, Bld: 94 mg/dL (ref 70–99)
Potassium: 3.6 mmol/L (ref 3.5–5.1)
Sodium: 141 mmol/L (ref 135–145)
Total Bilirubin: 0.6 mg/dL (ref 0.0–1.2)
Total Protein: 7.7 g/dL (ref 6.5–8.1)

## 2024-01-29 LAB — C-REACTIVE PROTEIN: CRP: 0.8 mg/dL (ref <1.0)

## 2024-01-29 LAB — VITAMIN B12: Vitamin B-12: 1533 pg/mL — ABNORMAL HIGH (ref 180–914)

## 2024-01-29 LAB — FOLATE: Folate: 8.5 ng/mL (ref >5.9)

## 2024-01-29 LAB — LACTATE DEHYDROGENASE: LDH: 193 U/L (ref 105–235)

## 2024-01-29 LAB — HEPATITIS B SURFACE ANTIBODY,QUALITATIVE: Hep B S Ab: NONREACTIVE

## 2024-01-29 LAB — SEDIMENTATION RATE: Sed Rate: 13 mm/h (ref 0–22)

## 2024-01-30 LAB — HEPATITIS C ANTIBODY: HCV Ab: NONREACTIVE

## 2024-01-30 LAB — HIV ANTIBODY (ROUTINE TESTING W REFLEX): HIV Screen 4th Generation wRfx: NONREACTIVE

## 2024-01-30 LAB — HEPATITIS B SURFACE ANTIGEN: Hepatitis B Surface Ag: NONREACTIVE

## 2024-01-30 LAB — HEPATITIS B CORE ANTIBODY, TOTAL: HEP B CORE AB: NEGATIVE

## 2024-01-31 LAB — METHYLMALONIC ACID, SERUM: Methylmalonic Acid, Quantitative: 111 nmol/L (ref 0–378)

## 2024-02-02 ENCOUNTER — Ambulatory Visit: Payer: Self-pay

## 2024-02-02 NOTE — Telephone Encounter (Signed)
-----   Message from Nurse Almarie T sent at 02/02/2024  2:05 PM EST -----  ----- Message ----- From: Federico Norleen ONEIDA MADISON, MD Sent: 02/02/2024  10:23 AM EST To: Almarie DELENA Arabia, RN  Please let Rebecca Graves know that her WBC returned to normal, >4. Her other labs were normal and showed no nutritional deficiency, inflammation, or viral cause for her low WBC. 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 Recommend she continue to follow with her PCP. No further workup is required for her low WBC. If her counts were to worsen or if she were to  develop recurrent infections we are happy to see her back.  ----- Message ----- From: Rebecka, Lab In [place] Sent: 01/29/2024   4:10 PM EST To: Norleen ONEIDA Federico MADISON, MD

## 2024-02-02 NOTE — Telephone Encounter (Signed)
 TC to patient regarding recent lab results and left message as voice mail identified pt.  Per Dr Federico ,  WBC returned to normal, >4. Her other labs were normal and showed no nutritional deficiency, inflammation, or viral cause for her low WBC. 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 Recommend she continue to follow with her PCP. No further workup is required for her low WBC. If her counts were to worsen or if she were to  develop recurrent infections we are happy to see her back.   Advised to call back if she has questions. Left office contact number [phone] .

## 2024-03-28 ENCOUNTER — Telehealth: Payer: Self-pay

## 2024-03-28 DIAGNOSIS — N951 Menopausal and female climacteric states: Secondary | ICD-10-CM

## 2024-03-28 DIAGNOSIS — E1142 Type 2 diabetes mellitus with diabetic polyneuropathy: Secondary | ICD-10-CM

## 2024-03-28 DIAGNOSIS — I10 Essential (primary) hypertension: Secondary | ICD-10-CM

## 2024-03-28 MED ORDER — GABAPENTIN 600 MG PO TABS: 600.0000 mg | ORAL_TABLET | Freq: Two times a day (BID) | ORAL | 1 refills | Status: AC

## 2024-03-28 MED ORDER — FUROSEMIDE 40 MG PO TABS: 40.0000 mg | ORAL_TABLET | Freq: Every day | ORAL | 1 refills | Status: AC

## 2024-03-28 MED ORDER — ESTRADIOL 0.5 MG PO TABS: ORAL_TABLET | ORAL | 1 refills | Status: AC

## 2024-03-28 NOTE — Telephone Encounter (Signed)
Patient aware rx's sent in

## 2024-03-28 NOTE — Telephone Encounter (Signed)
 Copied from CRM #[id number]. Topic: Clinical - Medication Refill >> Mar 28, 2024  9:33 AM Susanna ORN wrote: Medication: estradiol  (ESTRACE ) 0.5 MG tablet, furosemide  (LASIX ) 40 MG tablet, gabapentin  (NEURONTIN ) 600 MG tablet, & Accu Guide Meter Kit  Has the patient contacted their pharmacy? No (Agent: If no, request that the patient contact the pharmacy for the refill. If patient does not wish to contact the pharmacy document the reason why and proceed with request.) (Agent: If yes, when and what did the pharmacy advise?)  This is the patient's preferred pharmacy:  [facility] - [place], [place] - [address] [place] [address] Phone: [phone] Fax: [phone]  Is this the correct pharmacy for this prescription? Yes If no, delete pharmacy and type the correct one.   Has the prescription been filled recently? Yes  Is the patient out of the medication? N/A  Has the patient been seen for an appointment in the last year OR does the patient have an upcoming appointment? Yes  Can we respond through MyChart? No  Agent: Please be advised that Rx refills may take up to 3 business days. We ask that you follow-up with your pharmacy.

## 2024-07-04 ENCOUNTER — Ambulatory Visit: Admitting: Orthopedic Surgery
